# Patient Record
Sex: Male | Born: 1955
Health system: Southern US, Community
[De-identification: ages and names within clinical notes are randomized; demographics above are authoritative.]

## PROBLEM LIST (undated history)

## (undated) DIAGNOSIS — G453 Amaurosis fugax: Secondary | ICD-10-CM

## (undated) DIAGNOSIS — H9311 Tinnitus, right ear: Secondary | ICD-10-CM

## (undated) DIAGNOSIS — Z86010 Personal history of colonic polyps: Secondary | ICD-10-CM

## (undated) DIAGNOSIS — J40 Bronchitis, not specified as acute or chronic: Secondary | ICD-10-CM

## (undated) DIAGNOSIS — T4145XA Adverse effect of unspecified anesthetic, initial encounter: Secondary | ICD-10-CM

## (undated) DIAGNOSIS — F329 Major depressive disorder, single episode, unspecified: Secondary | ICD-10-CM

## (undated) DIAGNOSIS — F32A Depression, unspecified: Secondary | ICD-10-CM

## (undated) DIAGNOSIS — J45909 Unspecified asthma, uncomplicated: Secondary | ICD-10-CM

## (undated) DIAGNOSIS — B009 Herpesviral infection, unspecified: Secondary | ICD-10-CM

## (undated) DIAGNOSIS — R51 Headache: Secondary | ICD-10-CM

## (undated) DIAGNOSIS — C61 Malignant neoplasm of prostate: Secondary | ICD-10-CM

## (undated) DIAGNOSIS — I639 Cerebral infarction, unspecified: Secondary | ICD-10-CM

## (undated) DIAGNOSIS — J189 Pneumonia, unspecified organism: Secondary | ICD-10-CM

## (undated) DIAGNOSIS — S0990XA Unspecified injury of head, initial encounter: Secondary | ICD-10-CM

## (undated) DIAGNOSIS — I10 Essential (primary) hypertension: Secondary | ICD-10-CM

## (undated) DIAGNOSIS — M1990 Unspecified osteoarthritis, unspecified site: Secondary | ICD-10-CM

## (undated) DIAGNOSIS — S99929A Unspecified injury of unspecified foot, initial encounter: Secondary | ICD-10-CM

## (undated) DIAGNOSIS — K6289 Other specified diseases of anus and rectum: Secondary | ICD-10-CM

## (undated) DIAGNOSIS — M533 Sacrococcygeal disorders, not elsewhere classified: Secondary | ICD-10-CM

## (undated) DIAGNOSIS — T8859XA Other complications of anesthesia, initial encounter: Secondary | ICD-10-CM

## (undated) DIAGNOSIS — M4810 Ankylosing hyperostosis [Forestier], site unspecified: Secondary | ICD-10-CM

## (undated) DIAGNOSIS — G8929 Other chronic pain: Secondary | ICD-10-CM

## (undated) HISTORY — DX: Ankylosing hyperostosis (forestier), site unspecified: M48.10

## (undated) HISTORY — DX: Personal history of colonic polyps: Z86.010

## (undated) HISTORY — DX: Other chronic pain: G89.29

## (undated) HISTORY — DX: Depression, unspecified: F32.A

## (undated) HISTORY — DX: Unspecified osteoarthritis, unspecified site: M19.90

## (undated) HISTORY — PX: COLONOSCOPY: SHX174

## (undated) HISTORY — PX: TOOTH EXTRACTION: SUR596

## (undated) HISTORY — DX: Major depressive disorder, single episode, unspecified: F32.9

## (undated) HISTORY — DX: Essential (primary) hypertension: I10

## (undated) HISTORY — DX: Sacrococcygeal disorders, not elsewhere classified: M53.3

## (undated) HISTORY — PX: TONSILLECTOMY: SUR1361

---

## 1998-11-06 ENCOUNTER — Encounter: Payer: Self-pay | Admitting: Internal Medicine

## 2003-09-04 ENCOUNTER — Encounter: Payer: Self-pay | Admitting: Internal Medicine

## 2004-02-19 ENCOUNTER — Ambulatory Visit: Payer: Self-pay | Admitting: Internal Medicine

## 2004-08-18 ENCOUNTER — Ambulatory Visit: Payer: Self-pay | Admitting: Internal Medicine

## 2004-08-25 ENCOUNTER — Ambulatory Visit: Payer: Self-pay | Admitting: Internal Medicine

## 2005-03-15 ENCOUNTER — Ambulatory Visit: Payer: Self-pay | Admitting: Internal Medicine

## 2005-04-19 DIAGNOSIS — Z860100 Personal history of colon polyps, unspecified: Secondary | ICD-10-CM

## 2005-04-19 HISTORY — DX: Personal history of colonic polyps: Z86.010

## 2005-04-19 HISTORY — DX: Personal history of colon polyps, unspecified: Z86.0100

## 2006-01-10 ENCOUNTER — Ambulatory Visit: Payer: Self-pay | Admitting: Internal Medicine

## 2006-01-19 ENCOUNTER — Ambulatory Visit: Payer: Self-pay | Admitting: Gastroenterology

## 2006-01-25 ENCOUNTER — Ambulatory Visit: Payer: Self-pay | Admitting: Gastroenterology

## 2006-01-25 ENCOUNTER — Encounter (INDEPENDENT_AMBULATORY_CARE_PROVIDER_SITE_OTHER): Payer: Self-pay | Admitting: *Deleted

## 2006-01-25 ENCOUNTER — Encounter: Payer: Self-pay | Admitting: Internal Medicine

## 2006-08-23 ENCOUNTER — Ambulatory Visit: Payer: Self-pay | Admitting: Internal Medicine

## 2006-08-24 LAB — CONVERTED CEMR LAB
ALT: 41 units/L — ABNORMAL HIGH (ref 0–40)
Basophils Absolute: 0 10*3/uL (ref 0.0–0.1)
Bilirubin, Direct: 0.1 mg/dL (ref 0.0–0.3)
Calcium: 10.2 mg/dL (ref 8.4–10.5)
Direct LDL: 153.3 mg/dL
Eosinophils Absolute: 0.1 10*3/uL (ref 0.0–0.6)
Eosinophils Relative: 1.3 % (ref 0.0–5.0)
GFR calc Af Amer: 101 mL/min
GFR calc non Af Amer: 83 mL/min
Glucose, Bld: 95 mg/dL (ref 70–99)
Lymphocytes Relative: 29.9 % (ref 12.0–46.0)
MCHC: 34.3 g/dL (ref 30.0–36.0)
MCV: 94.1 fL (ref 78.0–100.0)
Monocytes Relative: 5.4 % (ref 3.0–11.0)
Neutro Abs: 4.5 10*3/uL (ref 1.4–7.7)
Platelets: 242 10*3/uL (ref 150–400)
WBC: 7.1 10*3/uL (ref 4.5–10.5)

## 2006-09-07 ENCOUNTER — Ambulatory Visit: Payer: Self-pay | Admitting: Internal Medicine

## 2006-09-08 ENCOUNTER — Encounter: Payer: Self-pay | Admitting: Internal Medicine

## 2007-01-16 DIAGNOSIS — I10 Essential (primary) hypertension: Secondary | ICD-10-CM | POA: Insufficient documentation

## 2007-02-22 ENCOUNTER — Ambulatory Visit: Payer: Self-pay | Admitting: Internal Medicine

## 2007-06-16 ENCOUNTER — Ambulatory Visit: Payer: Self-pay | Admitting: Internal Medicine

## 2007-06-16 DIAGNOSIS — M4810 Ankylosing hyperostosis [Forestier], site unspecified: Secondary | ICD-10-CM | POA: Insufficient documentation

## 2007-06-16 DIAGNOSIS — Z86010 Personal history of colonic polyps: Secondary | ICD-10-CM | POA: Insufficient documentation

## 2007-06-20 ENCOUNTER — Telehealth: Payer: Self-pay | Admitting: Internal Medicine

## 2007-06-30 ENCOUNTER — Encounter: Payer: Self-pay | Admitting: Internal Medicine

## 2007-07-03 ENCOUNTER — Encounter: Payer: Self-pay | Admitting: Internal Medicine

## 2007-08-18 ENCOUNTER — Ambulatory Visit: Payer: Self-pay | Admitting: Internal Medicine

## 2007-08-28 ENCOUNTER — Ambulatory Visit: Payer: Self-pay | Admitting: Internal Medicine

## 2007-08-28 DIAGNOSIS — J069 Acute upper respiratory infection, unspecified: Secondary | ICD-10-CM | POA: Insufficient documentation

## 2008-02-01 ENCOUNTER — Ambulatory Visit: Payer: Self-pay | Admitting: Internal Medicine

## 2008-02-01 DIAGNOSIS — F329 Major depressive disorder, single episode, unspecified: Secondary | ICD-10-CM | POA: Insufficient documentation

## 2008-02-01 DIAGNOSIS — F3289 Other specified depressive episodes: Secondary | ICD-10-CM | POA: Insufficient documentation

## 2008-02-05 ENCOUNTER — Telehealth: Payer: Self-pay | Admitting: Internal Medicine

## 2008-02-05 LAB — CONVERTED CEMR LAB
Alkaline Phosphatase: 18 units/L — ABNORMAL LOW (ref 39–117)
BUN: 16 mg/dL (ref 6–23)
Basophils Relative: 1.7 % (ref 0.0–3.0)
Bilirubin, Direct: 0.1 mg/dL (ref 0.0–0.3)
CO2: 29 meq/L (ref 19–32)
Chloride: 104 meq/L (ref 96–112)
Creatinine, Ser: 0.9 mg/dL (ref 0.4–1.5)
Eosinophils Relative: 2.8 % (ref 0.0–5.0)
Glucose, Bld: 98 mg/dL (ref 70–99)
Lymphocytes Relative: 29.5 % (ref 12.0–46.0)
Neutrophils Relative %: 60.1 % (ref 43.0–77.0)
RBC: 4.74 M/uL (ref 4.22–5.81)
TSH: 1.16 microintl units/mL (ref 0.35–5.50)
Testosterone: 243.37 ng/dL — ABNORMAL LOW (ref 350.00–890)
Total Bilirubin: 0.9 mg/dL (ref 0.3–1.2)
Total Protein: 7 g/dL (ref 6.0–8.3)
WBC: 6.4 10*3/uL (ref 4.5–10.5)

## 2008-02-09 ENCOUNTER — Telehealth: Payer: Self-pay | Admitting: Internal Medicine

## 2008-02-20 ENCOUNTER — Encounter: Admission: RE | Admit: 2008-02-20 | Discharge: 2008-04-16 | Payer: Self-pay | Admitting: Internal Medicine

## 2008-03-25 ENCOUNTER — Ambulatory Visit: Payer: Self-pay | Admitting: Internal Medicine

## 2008-04-18 ENCOUNTER — Encounter: Payer: Self-pay | Admitting: Internal Medicine

## 2008-06-13 ENCOUNTER — Ambulatory Visit: Payer: Self-pay | Admitting: Internal Medicine

## 2008-12-05 ENCOUNTER — Ambulatory Visit: Payer: Self-pay | Admitting: Internal Medicine

## 2009-06-18 ENCOUNTER — Ambulatory Visit: Payer: Self-pay | Admitting: Internal Medicine

## 2009-06-18 DIAGNOSIS — E349 Endocrine disorder, unspecified: Secondary | ICD-10-CM | POA: Insufficient documentation

## 2009-06-19 ENCOUNTER — Telehealth: Payer: Self-pay | Admitting: Internal Medicine

## 2009-06-19 LAB — CONVERTED CEMR LAB
BUN: 10 mg/dL (ref 6–23)
Basophils Absolute: 0 10*3/uL (ref 0.0–0.1)
Bilirubin, Direct: 0.2 mg/dL (ref 0.0–0.3)
Cholesterol: 210 mg/dL — ABNORMAL HIGH (ref 0–200)
Creatinine, Ser: 1.1 mg/dL (ref 0.4–1.5)
Direct LDL: 134.2 mg/dL
GFR calc non Af Amer: 73.59 mL/min (ref >60)
Glucose, Bld: 93 mg/dL (ref 70–99)
HCT: 44.4 % (ref 39.0–52.0)
HDL: 57.3 mg/dL (ref >39.00)
Lymphs Abs: 1.7 10*3/uL (ref 0.7–4.0)
MCV: 95.2 fL (ref 78.0–100.0)
Monocytes Absolute: 0.6 10*3/uL (ref 0.1–1.0)
Monocytes Relative: 9.1 % (ref 3.0–12.0)
Neutrophils Relative %: 61.1 % (ref 43.0–77.0)
PSA: 3.09 ng/mL (ref 0.10–4.00)
Platelets: 231 10*3/uL (ref 150.0–400.0)
Potassium: 3.9 meq/L (ref 3.5–5.1)
RDW: 11.7 % (ref 11.5–14.6)
TSH: 1.81 microintl units/mL (ref 0.35–5.50)
Testosterone: 239.38 ng/dL — ABNORMAL LOW (ref 350.00–890.00)
Total Bilirubin: 0.9 mg/dL (ref 0.3–1.2)
VLDL: 46.8 mg/dL — ABNORMAL HIGH (ref 0.0–40.0)
WBC: 6.1 10*3/uL (ref 4.5–10.5)

## 2009-07-02 ENCOUNTER — Encounter: Payer: Self-pay | Admitting: Internal Medicine

## 2009-12-31 ENCOUNTER — Ambulatory Visit: Payer: Self-pay | Admitting: Internal Medicine

## 2010-05-19 NOTE — Assessment & Plan Note (Signed)
Summary: emp-will fast//ccm   Vital Signs:  Patient profile:   55 year old male Height:      68 inches Weight:      217 pounds BMI:     33.11 Temp:     98.8 degrees F oral BP sitting:   130 / 90  (left arm) Cuff size:   large  Vitals Entered By: Duard Brady LPN (June 19, 2954 8:46 AM) CC: cpx - noted increase in BP - pt is self adjusting medication in effort to improve - unsucessful Is Patient Diabetic? No   CC:  cpx - noted increase in BP - pt is self adjusting medication in effort to improve - unsucessful.  History of Present Illness: 55 year old patient who has a history of DISH who is in today for a extensive assessment.  he continues to have generalized pain, stiffness, but remains a fairly active with this is [facility].  He continues to have considerable pain with the transferring.  He is concerned about worsening kyphosis and has been use in a backpack to assist with straightening.  He is treated hypertension, which has trended up a bit.  He has self titrated his metoprolol to 250 mg daily in divided dosages  Preventive Screening-Counseling & Management  Alcohol-Tobacco     Smoking Status: quit  Caffeine-Diet-Exercise     Does Patient Exercise: yes  Allergies: 1)  ! * Amitriptyline  Past History:  Past Medical History: Hypertension DISH Colonic polyps, hx of Depression testosterone deficiency  Past Surgical History: Reviewed history from 06/16/2007 and no changes required. Colonoscopy October 07  Family History: Reviewed history from 06/16/2007 and no changes required. Family History Hypertension Family History of Hodgkin's Disease father died age 104, motor vehicle accident  mother history of hypothyroidism 3 brothers, history hypertension 4 sisters, history of Hodkins disease,hypothyroidism , hypertension  Social History: Married Regular exercise-yes Smoking Status:  quit Does Patient Exercise:  yes  Review of  Systems       The patient complains of difficulty walking and depression.  The patient denies anorexia, fever, weight loss, weight gain, vision loss, decreased hearing, hoarseness, chest pain, syncope, dyspnea on exertion, peripheral edema, prolonged cough, headaches, hemoptysis, abdominal pain, melena, hematochezia, severe indigestion/heartburn, hematuria, incontinence, genital sores, muscle weakness, suspicious skin lesions, transient blindness, unusual weight change, abnormal bleeding, enlarged lymph nodes, angioedema, breast masses, and testicular masses.         chronic tinnitus  Physical Exam  General:  overweight-appearing.   140/82overweight-appearing.   Head:  Normocephalic and atraumatic without obvious abnormalities. No apparent alopecia or balding. Eyes:  No corneal or conjunctival inflammation noted. EOMI. Perrla. Funduscopic exam benign, without hemorrhages, exudates or papilledema. Vision grossly normal. Ears:  External ear exam shows no significant lesions or deformities.  Otoscopic examination reveals clear canals, tympanic membranes are intact bilaterally without bulging, retraction, inflammation or discharge. Hearing is grossly normal bilaterally. Nose:  External nasal examination shows no deformity or inflammation. Nasal mucosa are pink and moist without lesions or exudates. Mouth:  Oral mucosa and oropharynx without lesions or exudates.  Teeth in good repair. Neck:  No deformities, masses, or tenderness noted. Chest Wall:  No deformities, masses, tenderness or gynecomastia noted. Breasts:  No masses or gynecomastia noted Lungs:  Normal respiratory effort, chest expands symmetrically. Lungs are clear to auscultation, no crackles or wheezes. Heart:  Normal rate and regular rhythm. S1 and S2 normal without gallop, murmur, click, rub or other extra sounds.  Abdomen:  Bowel sounds positive,abdomen soft and non-tender without masses, organomegaly or hernias noted. Rectal:  No  external abnormalities noted. Normal sphincter tone. No rectal masses or tenderness. Genitalia:  Testes bilaterally descended without nodularity, tenderness or masses. No scrotal masses or lesions. No penis lesions or urethral discharge. Prostate:  Prostate gland firm and smooth, no enlargement, nodularity, tenderness, mass, asymmetry or induration. Msk:  No deformity or scoliosis noted of thoracic or lumbar spine.   Pulses:  decreased left dorsalis pedis pulse Extremities:  No clubbing, cyanosis, edema, or deformity noted with normal full range of motion of all joints.   Neurologic:  No cranial nerve deficits noted. Station and gait are normal. Plantar reflexes are down-going bilaterally. DTRs are symmetrical throughout. Sensory, motor and coordinative functions appear intact. Skin:  Intact without suspicious lesions or rashes Cervical Nodes:  No lymphadenopathy noted Axillary Nodes:  No palpable lymphadenopathy Inguinal Nodes:  No significant adenopathy Psych:  Cognition and judgment appear intact. Alert and cooperative with normal attention span and concentration. No apparent delusions, illusions, hallucinations   Impression & Recommendations:  Problem # 1:  DEPRESSION (ICD-311)  Problem # 2:  DIFFUSE IDIOPATHIC SKELETAL HYPEROSTOSIS (ICD-733.99)  Orders: Venipuncture (16109) TLB-Lipid Panel (80061-LIPID) TLB-BMP (Basic Metabolic Panel-BMET) (80048-METABOL) TLB-CBC Platelet - w/Differential (85025-CBCD) TLB-Hepatic/Liver Function Pnl (80076-HEPATIC) TLB-TSH (Thyroid Stimulating Hormone) (84443-TSH) Rheumatology Referral (Rheumatology)  Problem # 3:  HYPERTENSION (ICD-401.9)  His updated medication list for this problem includes:    Metoprolol Tartrate 100 Mg Tabs (Metoprolol tartrate) ..... One and one half   twice daily    Furosemide 40 Mg Tabs (Furosemide) ..... One daily, as needed for swelling  His updated medication list for this problem includes:    Metoprolol Tartrate  100 Mg Tabs (Metoprolol tartrate) ..... One  twice daily    Furosemide 40 Mg Tabs (Furosemide) ..... One daily, as needed for swelling  Orders: Venipuncture (60454) TLB-Lipid Panel (80061-LIPID) TLB-BMP (Basic Metabolic Panel-BMET) (80048-METABOL) TLB-CBC Platelet - w/Differential (85025-CBCD) TLB-Hepatic/Liver Function Pnl (80076-HEPATIC) TLB-TSH (Thyroid Stimulating Hormone) (84443-TSH)  Problem # 4:  COLONIC POLYPS, HX OF (ICD-V12.72)  Orders: Venipuncture (09811) TLB-Lipid Panel (80061-LIPID) TLB-BMP (Basic Metabolic Panel-BMET) (80048-METABOL) TLB-CBC Platelet - w/Differential (85025-CBCD) TLB-Hepatic/Liver Function Pnl (80076-HEPATIC) TLB-TSH (Thyroid Stimulating Hormone) (84443-TSH)  Complete Medication List: 1)  Clobetasol Propionate 0.05 % Crea (Clobetasol propionate) .... Apply twice daily as needed 2)  Flexeril 10 Mg Tabs (Cyclobenzaprine hcl) .[name].. 1 q6h as needed 3)  Adult Aspirin Low Strength 81 Mg Tbdp (Aspirin) .[name].. 1 once daily 4)  Ibuprofen 800 Mg Tabs (Ibuprofen) .[name].. 1 three times a day as needed 5)  Darvocet-n 100 100-650 Mg Tabs (Propoxyphene n-apap) .[name].. 1 q6h as needed 6)  Amlodipine Besylate 10 Mg Tabs (amlodipine Besylate)  .... One daily 7)  Metoprolol Tartrate 100 Mg Tabs (Metoprolol tartrate) .... One and one half   twice daily 8)  Hydrocodone-homatropine 5-1.5 Mg/41ml Syrp (Hydrocodone-homatropine) .... One tsp every 6 hours for cough 9)  Voltaren 1 % Gel (Diclofenac sodium) .... Used twice daily as needed 10)  Androgel Pump 1 % Gel (Testosterone) .... 4 pumps qam 11)  Furosemide 40 Mg Tabs (Furosemide) .... One daily, as needed for swelling 12)  Tramadol Hcl 50 Mg Tabs (Tramadol hcl) .... Prn 13)  Zolpidem Tartrate 10 Mg Tabs (Zolpidem tartrate) .... One at bedtime as needed for sleep  Other Orders: TLB-PSA (Prostate Specific Antigen) (84153-PSA) TLB-Testosterone, Total (84403-TESTO)  Patient Instructions: 1)  Please schedule a follow-up  appointment in 6 months. 2)  Limit your Sodium (Salt). 3)  It is important that you exercise regularly at least 20 minutes 5 times a week. If you develop chest pain, have severe difficulty breathing, or feel very tired , stop exercising immediately and seek medical attention. 4)  You need to lose weight. Consider a lower calorie diet and regular exercise.  5)  Check your Blood Pressure regularly. If it is above: 150/90 you should make an appointment. Prescriptions: ZOLPIDEM TARTRATE 10 MG TABS (ZOLPIDEM TARTRATE) one at bedtime as needed for sleep  #50 x 4   Entered and Authorized by:   Gordy Savers  MD   Signed by:   Gordy Savers  MD on 06/18/2009   Method used:   Print then Give to Patient   RxID:   [id number] TRAMADOL HCL 50 MG TABS (TRAMADOL HCL) prn  #120 x 6   Entered and Authorized by:   Gordy Savers  MD   Signed by:   Gordy Savers  MD on 06/18/2009   Method used:   Print then Give to Patient   RxID:   [id number] FUROSEMIDE 40 MG TABS (FUROSEMIDE) one daily, as needed for swelling  #90 x 6   Entered and Authorized by:   Gordy Savers  MD   Signed by:   Gordy Savers  MD on 06/18/2009   Method used:   Print then Give to Patient   RxID:   [id number] ANDROGEL PUMP 1 % GEL (TESTOSTERONE) 4 pumps qam  #6 x 6   Entered and Authorized by:   Gordy Savers  MD   Signed by:   Gordy Savers  MD on 06/18/2009   Method used:   Print then Give to Patient   RxID:   [id number] VOLTAREN 1 % GEL (DICLOFENAC SODIUM) used twice daily as needed  #12 x 6   Entered and Authorized by:   Gordy Savers  MD   Signed by:   Gordy Savers  MD on 06/18/2009   Method used:   Print then Give to Patient   RxID:   [id number] METOPROLOL TARTRATE 100 MG  TABS (METOPROLOL TARTRATE) one and one half   twice daily  #270 x 6   Entered and Authorized by:   Gordy Savers  MD   Signed by:   Gordy Savers  MD on  06/18/2009   Method used:   Print then Give to Patient   RxID:   [id number] AMLODIPINE BESYLATE 10 MG  TABS (AMLODIPINE BESYLATE) one daily  #90 x 6   Entered and Authorized by:   Gordy Savers  MD   Signed by:   Gordy Savers  MD on 06/18/2009   Method used:   Print then Give to Patient   RxID:   [id number] IBUPROFEN 800 MG  TABS (IBUPROFEN) 1 three times a day as needed  #270 x 6   Entered and Authorized by:   Gordy Savers  MD   Signed by:   Gordy Savers  MD on 06/18/2009   Method used:   Print then Give to Patient   RxID:   [id number] FLEXERIL 10 MG TABS (CYCLOBENZAPRINE HCL) 1 q6h as needed  #180 x 6   Entered and Authorized by:   Gordy Savers  MD   Signed by:   Gordy Savers  MD on 06/18/2009   Method used:   Print then Give to Patient   RxID:  [id number]  

## 2010-05-19 NOTE — Progress Notes (Signed)
Summary: called again- change in medication  Phone Note Call from Patient   Caller: Patient Call For: Selena Batten Summary of Call: returning your call Initial call taken by: Raechel Ache, RN,  June 19, 2009 10:29 AM  Follow-up for Phone Call        spoke with patient - informed of labs and change to medication.  Patient questioning how to apply double the amt medication? limited mobility and was wondering is there any better way to accomplish? Follow-up by: Duard Brady LPN,  June 19, 2009 1:48 PM  Additional Follow-up for Phone Call Additional follow up Details #1::        on Androgel- can he use 4 squirts two times a day? if so, needs new script with increased dosage. Additional Follow-up by: Raechel Ache, RN,  June 19, 2009 2:58 PM    Additional Follow-up for Phone Call Additional follow up Details #2::    OK to change to 6 puffs but must take all in the am; OK to change Rx to comply with new order Follow-up by: Gordy Savers  MD,  June 19, 2009 4:55 PM  New/Updated Medications: ANDROGEL PUMP 1 % GEL (TESTOSTERONE) 6 pumps qam Prescriptions: ANDROGEL PUMP 1 % GEL (TESTOSTERONE) 6 pumps qam  #3 pumps x 6   Entered and Authorized by:   Gordy Savers  MD   Signed by:   Gordy Savers  MD on 06/19/2009   Method used:   Print then Give to Patient   RxID:   [id number]   Appended Document: called again- change in medication spoke with pt - discussed new directions on increase - rx ready for pick up . KIK

## 2010-05-19 NOTE — Assessment & Plan Note (Signed)
Summary: 6 month fup//ccm   Vital Signs:  Patient profile:   55 year old male Weight:      214 pounds Temp:     98.6 degrees F oral BP sitting:   118 / 70  (left arm) Cuff size:   regular  Vitals Entered By: Duard Brady LPN (December 31, 2009 11:38 AM) CC: 6 mos rov - doing well Is Patient Diabetic? No Flu Vaccine Consent Questions     Do you have a history of severe allergic reactions to this vaccine? no    Any prior history of allergic reactions to egg and/or gelatin? no    Do you have a sensitivity to the preservative Thimersol? no    Do you have a past history of Guillan-Barre Syndrome? no    Do you currently have an acute febrile illness? no    Have you ever had a severe reaction to latex? no    Vaccine information given and explained to patient? yes    Are you currently pregnant? no    Lot Number:[id number]   Exp Date:10/17/2010   Site Given  Left Deltoid IM   CC:  6 mos rov - doing well.  History of Present Illness: 55 year old patient who is in today for follow-up of advanced DISH.  He is doing reasonably well, but still, quite symptomatic.  Earlier he had some significant left shoulder pain and diminished range of motion, which has improved with the aggressive physical therapy.  He has been diagnosed with early macular degeneration, but pressures have been running in the high normal range.  He has testosterone deficiency on supplements.  He also has a history of colonic polyps.  He has a history also of depression, which has been stable  Allergies: 1)  ! * Amitriptyline  Past History:  Past Medical History: Hypertension DISH Colonic polyps, hx of Depression testosterone deficiency macular degeneration  Past Surgical History: Reviewed history from 06/16/2007 and no changes required. Colonoscopy October 07  Review of Systems       The patient complains of muscle weakness, difficulty walking, and depression.  The patient denies anorexia, fever, weight  loss, weight gain, vision loss, decreased hearing, hoarseness, chest pain, syncope, dyspnea on exertion, peripheral edema, prolonged cough, headaches, hemoptysis, abdominal pain, melena, hematochezia, severe indigestion/heartburn, hematuria, incontinence, genital sores, suspicious skin lesions, transient blindness, unusual weight change, abnormal bleeding, enlarged lymph nodes, angioedema, breast masses, and testicular masses.    Physical Exam  General:  overweight-appearing.  130/82 Head:  Normocephalic and atraumatic without obvious abnormalities. No apparent alopecia or balding. Eyes:  No corneal or conjunctival inflammation noted. EOMI. Perrla. Funduscopic exam benign, without hemorrhages, exudates or papilledema. Vision grossly normal. Mouth:  Oral mucosa and oropharynx without lesions or exudates.  Teeth in good repair. Neck:  No deformities, masses, or tenderness noted. Lungs:  Normal respiratory effort, chest expands symmetrically. Lungs are clear to auscultation, no crackles or wheezes. Heart:  Normal rate and regular rhythm. S1 and S2 normal without gallop, murmur, click, rub or other extra sounds. Abdomen:  Bowel sounds positive,abdomen soft and non-tender without masses, organomegaly or hernias noted. Msk:  No deformity or scoliosis noted of thoracic or lumbar spine.   Pulses:  R and L carotid,radial,femoral,dorsalis pedis and posterior tibial pulses are full and equal bilaterally Extremities:  No clubbing, cyanosis, edema, or deformity noted with normal full range of motion of all joints.     Impression & Recommendations:  Problem # 1:  HYPOGONADISM (ICD-257.2)  Problem #  2:  DIFFUSE IDIOPATHIC SKELETAL HYPEROSTOSIS (ICD-733.99)  Problem # 3:  HYPERTENSION (ICD-401.9)  His updated medication list for this problem includes:    Metoprolol Tartrate 100 Mg Tabs (Metoprolol tartrate) ..... One and one half   twice daily    Furosemide 40 Mg Tabs (Furosemide) ..... One daily, as  needed for swelling  Complete Medication List: 1)  Clobetasol Propionate 0.05 % Crea (Clobetasol propionate) .... Apply twice daily as needed 2)  Flexeril 10 Mg Tabs (Cyclobenzaprine hcl) .[name].. 1 q6h as needed 3)  Adult Aspirin Low Strength 81 Mg Tbdp (Aspirin) .[name].. 1 once daily 4)  Ibuprofen 800 Mg Tabs (Ibuprofen) .[name].. 1 three times a day as needed 5)  Darvocet-n 100 100-650 Mg Tabs (Propoxyphene n-apap) .[name].. 1 q6h as needed 6)  Amlodipine Besylate 10 Mg Tabs (amlodipine Besylate)  .... One daily 7)  Metoprolol Tartrate 100 Mg Tabs (Metoprolol tartrate) .... One and one half   twice daily 8)  Hydrocodone-homatropine 5-1.5 Mg/51ml Syrp (Hydrocodone-homatropine) .... One tsp every 6 hours for cough 9)  Voltaren 1 % Gel (Diclofenac sodium) .... Used twice daily as needed 10)  Androgel Pump 1 % Gel (Testosterone) .... 8  pumps qam 11)  Furosemide 40 Mg Tabs (Furosemide) .... One daily, as needed for swelling 12)  Tramadol Hcl 50 Mg Tabs (Tramadol hcl) .... Prn 13)  Zolpidem Tartrate 10 Mg Tabs (Zolpidem tartrate) .... One at bedtime as needed for sleep  Other Orders: Flu Vaccine 63yrs + MEDICARE PATIENTS (V7616) Administration Flu vaccine - MCR (W7371)  Patient Instructions: 1)  Please schedule a follow-up appointment in 6 months for CPX  2)  Advised not to eat any food or drink any liquids after 10 PM the night before your procedure. 3)  Limit your Sodium (Salt). Prescriptions: ZOLPIDEM TARTRATE 10 MG TABS (ZOLPIDEM TARTRATE) one at bedtime as needed for sleep  #50 x 4   Entered and Authorized by:   Gordy Savers  MD   Signed by:   Gordy Savers  MD on 12/31/2009   Method used:   Print then Give to Patient   RxID:   [id number] TRAMADOL HCL 50 MG TABS (TRAMADOL HCL) prn  #120 x 6   Entered and Authorized by:   Gordy Savers  MD   Signed by:   Gordy Savers  MD on 12/31/2009   Method used:   Print then Give to Patient   RxID:   [id number] FUROSEMIDE  40 MG TABS (FUROSEMIDE) one daily, as needed for swelling  #90 x 6   Entered and Authorized by:   Gordy Savers  MD   Signed by:   Gordy Savers  MD on 12/31/2009   Method used:   Print then Give to Patient   RxID:   [id number] ANDROGEL PUMP 1 % GEL (TESTOSTERONE) 8  pumps qam  #4 x 6   Entered and Authorized by:   Gordy Savers  MD   Signed by:   Gordy Savers  MD on 12/31/2009   Method used:   Print then Give to Patient   RxID:   [id number] VOLTAREN 1 % GEL (DICLOFENAC SODIUM) used twice daily as needed  #12 x 6   Entered and Authorized by:   Gordy Savers  MD   Signed by:   Gordy Savers  MD on 12/31/2009   Method used:   Print then Give to Patient   RxID:   [id number] METOPROLOL TARTRATE 100  MG  TABS (METOPROLOL TARTRATE) one and one half   twice daily  #270 x 6   Entered and Authorized by:   Gordy Savers  MD   Signed by:   Gordy Savers  MD on 12/31/2009   Method used:   Print then Give to Patient   RxID:   [id number] AMLODIPINE BESYLATE 10 MG  TABS (AMLODIPINE BESYLATE) one daily  #90 x 6   Entered and Authorized by:   Gordy Savers  MD   Signed by:   Gordy Savers  MD on 12/31/2009   Method used:   Print then Give to Patient   RxID:   [id number] IBUPROFEN 800 MG  TABS (IBUPROFEN) 1 three times a day as needed  #270 x 6   Entered and Authorized by:   Gordy Savers  MD   Signed by:   Gordy Savers  MD on 12/31/2009   Method used:   Print then Give to Patient   RxID:   [id number] FLEXERIL 10 MG TABS (CYCLOBENZAPRINE HCL) 1 q6h as needed  #180 x 6   Entered and Authorized by:   Gordy Savers  MD   Signed by:   Gordy Savers  MD on 12/31/2009   Method used:   Print then Give to Patient   RxID:   [id number]

## 2010-05-19 NOTE — Consult Note (Signed)
Summary: [facility]   Imported By: Maryln Gottron 07/10/2009 14:44:47  _____________________________________________________________________  External Attachment:    Type:   Image     Comment:   External Document

## 2010-06-22 ENCOUNTER — Other Ambulatory Visit: Payer: Self-pay | Admitting: Internal Medicine

## 2010-06-24 ENCOUNTER — Other Ambulatory Visit: Payer: Self-pay | Admitting: Internal Medicine

## 2010-06-25 ENCOUNTER — Telehealth: Payer: Self-pay

## 2010-06-25 ENCOUNTER — Other Ambulatory Visit: Payer: Self-pay | Admitting: Internal Medicine

## 2010-06-25 NOTE — Telephone Encounter (Signed)
error 

## 2010-07-31 ENCOUNTER — Encounter: Payer: Self-pay | Admitting: Internal Medicine

## 2010-08-03 ENCOUNTER — Encounter: Payer: Self-pay | Admitting: Internal Medicine

## 2010-08-03 ENCOUNTER — Ambulatory Visit (INDEPENDENT_AMBULATORY_CARE_PROVIDER_SITE_OTHER): Payer: Medicare Other | Admitting: Internal Medicine

## 2010-08-03 VITALS — BP 118/80 | HR 70 | Temp 98.0°F | Resp 18 | Ht 68.5 in | Wt 218.0 lb

## 2010-08-03 DIAGNOSIS — I10 Essential (primary) hypertension: Secondary | ICD-10-CM

## 2010-08-03 DIAGNOSIS — N401 Enlarged prostate with lower urinary tract symptoms: Secondary | ICD-10-CM

## 2010-08-03 DIAGNOSIS — F3289 Other specified depressive episodes: Secondary | ICD-10-CM

## 2010-08-03 DIAGNOSIS — Z13220 Encounter for screening for lipoid disorders: Secondary | ICD-10-CM

## 2010-08-03 DIAGNOSIS — R338 Other retention of urine: Secondary | ICD-10-CM

## 2010-08-03 DIAGNOSIS — R339 Retention of urine, unspecified: Secondary | ICD-10-CM

## 2010-08-03 DIAGNOSIS — Z0000 Encounter for general adult medical examination without abnormal findings: Secondary | ICD-10-CM

## 2010-08-03 DIAGNOSIS — M948X9 Other specified disorders of cartilage, unspecified sites: Secondary | ICD-10-CM

## 2010-08-03 DIAGNOSIS — Z86010 Personal history of colonic polyps: Secondary | ICD-10-CM

## 2010-08-03 DIAGNOSIS — F329 Major depressive disorder, single episode, unspecified: Secondary | ICD-10-CM

## 2010-08-03 DIAGNOSIS — E291 Testicular hypofunction: Secondary | ICD-10-CM

## 2010-08-03 LAB — HEPATIC FUNCTION PANEL
ALT: 38 U/L (ref 0–53)
Albumin: 4.2 g/dL (ref 3.5–5.2)
Alkaline Phosphatase: 17 U/L — ABNORMAL LOW (ref 39–117)
Total Protein: 6.7 g/dL (ref 6.0–8.3)

## 2010-08-03 LAB — BASIC METABOLIC PANEL
CO2: 31 mEq/L (ref 19–32)
Chloride: 101 mEq/L (ref 96–112)
Sodium: 140 mEq/L (ref 135–145)

## 2010-08-03 LAB — CBC WITH DIFFERENTIAL/PLATELET
Basophils Relative: 0.5 % (ref 0.0–3.0)
Eosinophils Absolute: 0.1 10*3/uL (ref 0.0–0.7)
Hemoglobin: 14.8 g/dL (ref 13.0–17.0)
Lymphocytes Relative: 34.1 % (ref 12.0–46.0)
MCHC: 34.8 g/dL (ref 30.0–36.0)
MCV: 94.6 fl (ref 78.0–100.0)
Monocytes Absolute: 0.6 10*3/uL (ref 0.1–1.0)
Neutro Abs: 3.3 10*3/uL (ref 1.4–7.7)
RBC: 4.49 Mil/uL (ref 4.22–5.81)

## 2010-08-03 LAB — TESTOSTERONE: Testosterone: 171.72 ng/dL — ABNORMAL LOW (ref 350.00–890.00)

## 2010-08-03 LAB — LDL CHOLESTEROL, DIRECT: Direct LDL: 133.5 mg/dL

## 2010-08-03 LAB — LIPID PANEL: HDL: 46.1 mg/dL (ref >39.00)

## 2010-08-03 MED ORDER — METOPROLOL TARTRATE 100 MG PO TABS: 100.0000 mg | ORAL_TABLET | Freq: Two times a day (BID) | ORAL | Status: DC

## 2010-08-03 MED ORDER — ZOLPIDEM TARTRATE 10 MG PO TABS: 10.0000 mg | ORAL_TABLET | Freq: Every evening | ORAL | Status: DC | PRN

## 2010-08-03 MED ORDER — TESTOSTERONE 12.5 MG/ACT (1%) TD GEL: 1.0000 "application " | TRANSDERMAL | Status: DC

## 2010-08-03 MED ORDER — CYCLOBENZAPRINE HCL 10 MG PO TABS: 10.0000 mg | ORAL_TABLET | Freq: Three times a day (TID) | ORAL | Status: DC | PRN

## 2010-08-03 MED ORDER — DICLOFENAC SODIUM 1 % TD GEL: 1.0000 "application " | Freq: Two times a day (BID) | TRANSDERMAL | Status: DC | PRN

## 2010-08-03 MED ORDER — FUROSEMIDE 40 MG PO TABS: 40.0000 mg | ORAL_TABLET | Freq: Every day | ORAL | Status: DC | PRN

## 2010-08-03 MED ORDER — TRAMADOL HCL 50 MG PO TABS: 50.0000 mg | ORAL_TABLET | ORAL | Status: DC | PRN

## 2010-08-03 MED ORDER — AMLODIPINE BESYLATE 10 MG PO TABS: 10.0000 mg | ORAL_TABLET | Freq: Every day | ORAL | Status: DC

## 2010-08-03 NOTE — Patient Instructions (Signed)

## 2010-08-03 NOTE — Progress Notes (Signed)
Subjective:    Patient ID: Luis Fisher, male    DOB: 09-Mar-1956, 55 y.o.   MRN: [id number]  HPI  55 year old patient who is seen today for an annual examination. He has been disabled due to DISH. He continues to have pain and worsening stiffness and functional disability. Disability forms were filled out again today. He has treated hypertension testosterone deficiency and history of depression. He also has a history colonic polyps and has had recent followup colonoscopy.  Past Medical History  Diagnosis Date  . Hypertension   . Depression   . DISH (diffuse idiopathic skeletal hyperostosis)   . Hx of colonic polyps    No past surgical history on file.  reports that he quit smoking about 17 years ago. His smoking use included Cigars. He has never used smokeless tobacco. He reports that he drinks alcohol. He reports that he does not use illicit drugs. family history includes Hodgkin's lymphoma in his sister; Hypertension in his brother and sister; and Hypothyroidism in his mother and sister. No Known Allergies  1. Risk factors, based on past  M,S,F history  cardiovascular risk factors include hypertension  2.  Physical activities: Fairly sedentary due to orthopedic limitations with pain stiffness and weakness. He does go to the gym 3 times weekly for stretching and modest exercise  3.  Depression/mood: History of depression presently stable  4.  Hearing: No hearing deficits  5.  ADL's: Requires assistance with most aspects of daily living due to pain and stiffness  6.  Fall risk: Moderately high  7.  Home safety: No problems identified  8.  Height weight, and visual acuity; height and weight stable the changes is visual acuity  9.  Counseling: Heart healthy diet regular exercise with stretching and weight loss all encouraged  10. Lab orders based on risk factors: Laboratory profile including PSA and lipid profile will be reviewed  11. Referral : Not appropriate at this time  12.  Care plan: Continue heart healthy diet exercises with stretching modified salt diet encouraged  13. Cognitive assessment: Alert and oriented with normal affect no cognitive dysfunction      Review of Systems  Constitutional: Negative for fever, chills, activity change, appetite change and fatigue.  HENT: Negative for hearing loss, ear pain, congestion, rhinorrhea, sneezing, mouth sores, trouble swallowing, neck pain, neck stiffness, dental problem, voice change, sinus pressure and tinnitus.   Eyes: Negative for photophobia, pain, redness and visual disturbance.  Respiratory: Negative for apnea, cough, choking, chest tightness, shortness of breath and wheezing.   Cardiovascular: Negative for chest pain, palpitations and leg swelling.  Gastrointestinal: Negative for nausea, vomiting, abdominal pain, diarrhea, constipation, blood in stool, abdominal distention, anal bleeding and rectal pain.  Genitourinary: Negative for dysuria, urgency, frequency, hematuria, flank pain, decreased urine volume, discharge, penile swelling, scrotal swelling, difficulty urinating, genital sores and testicular pain.  Musculoskeletal: Positive for back pain, arthralgias and gait problem. Negative for myalgias and joint swelling.  Skin: Negative for color change, rash and wound.  Neurological: Negative for dizziness, tremors, seizures, syncope, facial asymmetry, speech difficulty, weakness, light-headedness, numbness and headaches.  Hematological: Negative for adenopathy. Does not bruise/bleed easily.  Psychiatric/Behavioral: Negative for suicidal ideas, hallucinations, behavioral problems, confusion, sleep disturbance, self-injury, dysphoric mood, decreased concentration and agitation. The patient is not nervous/anxious.        Objective:   Physical Exam  Constitutional: He appears well-developed and well-nourished.       Overweight normal blood pressure  HENT:  Head: Normocephalic  and atraumatic.  Right Ear:  External ear normal.  Left Ear: External ear normal.  Nose: Nose normal.  Mouth/Throat: Oropharynx is clear and moist.  Eyes: Conjunctivae and EOM are normal. Pupils are equal, round, and reactive to light. No scleral icterus.  Neck: Normal range of motion. Neck supple. No JVD present. No thyromegaly present.  Cardiovascular: Regular rhythm, normal heart sounds and intact distal pulses.  Exam reveals no gallop and no friction rub.   No murmur heard. Pulmonary/Chest: Effort normal and breath sounds normal. He exhibits no tenderness.  Abdominal: Soft. Bowel sounds are normal. He exhibits no distension and no mass. There is no tenderness.  Genitourinary: Prostate normal and penis normal.  Musculoskeletal: Normal range of motion. He exhibits no edema and no tenderness.  Lymphadenopathy:    He has no cervical adenopathy.  Neurological: He is alert. He has normal reflexes. No cranial nerve deficit. Coordination normal.  Skin: Skin is warm and dry. No rash noted.  Psychiatric: He has a normal mood and affect. His behavior is normal.          Assessment & Plan:   Annual health assessment DISH Hypertension controlled  Restricted salt heart healthy diet encouraged. More exercise was focused on stretching and attends a weight loss all encouraged. Laboratory profile reviewed

## 2010-08-04 ENCOUNTER — Other Ambulatory Visit: Payer: Self-pay

## 2010-08-04 MED ORDER — METOPROLOL TARTRATE 100 MG PO TABS: ORAL_TABLET | ORAL | Status: DC

## 2010-09-01 NOTE — Assessment & Plan Note (Signed)
[facility]                            [facility] OFFICE NOTE   NAME:Luis Fisher, DIQUAN KASSIS                        MRN:          [id number]  DATE:09/07/2006                            DOB:          [birth year]    This 55 year-old gentleman seen today for an annual exam.  He has a  history of DISH, hypertension and mild dyslipidemia.  He is doing well  today except for his musculoskeletal concerns.  They also has a history  of colonic polyps.  His last colonoscopy was done October 2007.   FAMILY HISTORY:  Reviewed, positive for hypothyroidism and hypertension.   PHYSICAL EXAMINATION:  Revealed a well-developed, mildly overweight male  in no acute distress.  Blood pressure was 120/90, lower on arrival.  Fundi, year nose and throat clear.  NECK:  No bruits or adenopathy.  CHEST:  Clear.  CARDIOVASCULAR:  Normal heart sounds, no murmurs.  ABDOMEN:  Mildly overweight, soft, nontender.  No organomegaly.  External genitalia normal.  RECTAL:  Prostate small and benign.  Stool heme-negative.  EXTREMITIES:  Full peripheral pulses.  NEUROLOGIC:  Negative.  MUSCULOSKELETAL EXAM:  Revealed generalized stiffness and diminished  range-of-motion.   IMPRESSION:  1. Hypertension.  2. Diffuse idiopathic skeletal hyperostosis.  3. Mild dyslipidemia.   DISPOSITION:  In view of his lack of activity and loss of height a bone  density study will be checked.  Medical regimen unchanged except his  metoprolol will be increased to 100 b.i.d.  Reassess in six months.     Gordy Savers, MD  Electronically Signed    PFK/MedQ  DD: 09/07/2006  DT: 09/07/2006  Job #: [id number]

## 2010-09-02 ENCOUNTER — Other Ambulatory Visit: Payer: Self-pay | Admitting: Internal Medicine

## 2010-09-02 ENCOUNTER — Telehealth: Payer: Self-pay

## 2010-09-02 MED ORDER — TESTOSTERONE 12.5 MG/ACT (1%) TD GEL: 8.0000 | TRANSDERMAL | Status: DC

## 2010-09-02 NOTE — Telephone Encounter (Signed)
Spoke with pharmacy gave refill okay. KIK

## 2010-11-19 ENCOUNTER — Telehealth: Payer: Self-pay | Admitting: Internal Medicine

## 2010-11-19 NOTE — Telephone Encounter (Signed)
Pt requesting to send prescriptions to [facility] for  Furosemide 40mg  Tramadol Cyclobenzaprine Zolpidem Ibuprofen 800mg  Metoprolol    Prescriptions for [place](these should remain at [facility])  Androgel Voltaren Amlodipine

## 2010-11-20 MED ORDER — TRAMADOL HCL 50 MG PO TABS: 50.0000 mg | ORAL_TABLET | ORAL | Status: DC | PRN

## 2010-11-20 MED ORDER — ZOLPIDEM TARTRATE 10 MG PO TABS: 10.0000 mg | ORAL_TABLET | Freq: Every evening | ORAL | Status: DC | PRN

## 2010-11-20 MED ORDER — IBUPROFEN 800 MG PO TABS: 800.0000 mg | ORAL_TABLET | Freq: Three times a day (TID) | ORAL | Status: DC | PRN

## 2010-11-20 MED ORDER — METOPROLOL TARTRATE 100 MG PO TABS: ORAL_TABLET | ORAL | Status: DC

## 2010-11-20 MED ORDER — CYCLOBENZAPRINE HCL 10 MG PO TABS: 10.0000 mg | ORAL_TABLET | Freq: Three times a day (TID) | ORAL | Status: DC | PRN

## 2010-11-20 MED ORDER — FUROSEMIDE 40 MG PO TABS: 40.0000 mg | ORAL_TABLET | Freq: Every day | ORAL | Status: DC | PRN

## 2010-11-20 NOTE — Telephone Encounter (Signed)
rx have been sent to the appropriate pharmacies.

## 2011-01-21 ENCOUNTER — Encounter: Payer: Self-pay | Admitting: Internal Medicine

## 2011-01-21 ENCOUNTER — Ambulatory Visit (INDEPENDENT_AMBULATORY_CARE_PROVIDER_SITE_OTHER): Payer: Medicare Other | Admitting: Internal Medicine

## 2011-01-21 VITALS — BP 122/80 | Temp 98.5°F | Wt 225.0 lb

## 2011-01-21 DIAGNOSIS — M948X9 Other specified disorders of cartilage, unspecified sites: Secondary | ICD-10-CM

## 2011-01-21 DIAGNOSIS — Z23 Encounter for immunization: Secondary | ICD-10-CM

## 2011-01-21 DIAGNOSIS — I10 Essential (primary) hypertension: Secondary | ICD-10-CM

## 2011-01-21 DIAGNOSIS — Z0000 Encounter for general adult medical examination without abnormal findings: Secondary | ICD-10-CM

## 2011-01-21 DIAGNOSIS — E291 Testicular hypofunction: Secondary | ICD-10-CM

## 2011-01-21 MED ORDER — CYCLOBENZAPRINE HCL 10 MG PO TABS: 10.0000 mg | ORAL_TABLET | Freq: Three times a day (TID) | ORAL | Status: DC | PRN

## 2011-01-21 MED ORDER — ZOLPIDEM TARTRATE 10 MG PO TABS: 10.0000 mg | ORAL_TABLET | Freq: Every evening | ORAL | Status: DC | PRN

## 2011-01-21 MED ORDER — METOPROLOL TARTRATE 100 MG PO TABS: ORAL_TABLET | ORAL | Status: DC

## 2011-01-21 MED ORDER — IBUPROFEN 800 MG PO TABS: 800.0000 mg | ORAL_TABLET | Freq: Three times a day (TID) | ORAL | Status: DC | PRN

## 2011-01-21 MED ORDER — AMLODIPINE BESYLATE 10 MG PO TABS: 10.0000 mg | ORAL_TABLET | Freq: Every day | ORAL | Status: DC

## 2011-01-21 MED ORDER — FUROSEMIDE 40 MG PO TABS: 40.0000 mg | ORAL_TABLET | Freq: Every day | ORAL | Status: DC | PRN

## 2011-01-21 MED ORDER — TESTOSTERONE 12.5 MG/ACT (1%) TD GEL: 8.0000 | TRANSDERMAL | Status: DC

## 2011-01-21 MED ORDER — TRAMADOL HCL 50 MG PO TABS: 50.0000 mg | ORAL_TABLET | ORAL | Status: DC | PRN

## 2011-01-21 MED ORDER — DICLOFENAC SODIUM 1 % TD GEL: 1.0000 "application " | Freq: Two times a day (BID) | TRANSDERMAL | Status: DC | PRN

## 2011-01-21 NOTE — Patient Instructions (Signed)
It is important that you exercise regularly, at least 20 minutes 3 to 4 times per week.  If you develop chest pain or shortness of breath seek  medical attention.  You need to lose weight.  Consider a lower calorie diet and regular exercise.  Limit your sodium (Salt) intake  Please check your blood pressure on a regular basis.  If it is consistently greater than 150/90, please make an office appointment.  Return in 6 months for follow-up

## 2011-01-21 NOTE — Progress Notes (Signed)
  Subjective:    Patient ID: Luis Fisher, male    DOB: [birth year], 55 y.o.   MRN: [id number]  HPI  Wt Readings from Last 3 Encounters:  01/21/11 225 lb (102.059 kg)  08/03/10 218 lb (98.884 kg)  12/31/09 214 lb (97.07 kg)     Review of Systems     Objective:   Physical Exam        Assessment & Plan:

## 2011-01-21 NOTE — Progress Notes (Signed)
  Subjective:    Patient ID: Luis Fisher, male    DOB: Aug 08, 1955, 55 y.o.   MRN: [id number]  HPI  55 year old patient who is seen today for followup. Over the summer he had some difficulty with right foot pain due to a flare of Morton's neuroma he was not mandatory for 6 weeks. There's been some weight gain since his last visit here 6 months ago he has treated hypertension which has been stable. He has DISH which limits his activities dramatically. He has testosterone deficiency and is on a supplement. In general doing about the same.    Review of Systems  Constitutional: Negative for fever, chills, appetite change and fatigue.  HENT: Negative for hearing loss, ear pain, congestion, sore throat, trouble swallowing, neck stiffness, dental problem, voice change and tinnitus.   Eyes: Negative for pain, discharge and visual disturbance.  Respiratory: Negative for cough, chest tightness, wheezing and stridor.   Cardiovascular: Negative for chest pain, palpitations and leg swelling.  Gastrointestinal: Negative for nausea, vomiting, abdominal pain, diarrhea, constipation, blood in stool and abdominal distention.  Genitourinary: Negative for urgency, hematuria, flank pain, discharge, difficulty urinating and genital sores.  Musculoskeletal: Positive for back pain and arthralgias. Negative for myalgias, joint swelling and gait problem.  Skin: Negative for rash.  Neurological: Negative for dizziness, syncope, speech difficulty, weakness, numbness and headaches.  Hematological: Negative for adenopathy. Does not bruise/bleed easily.  Psychiatric/Behavioral: Negative for behavioral problems and dysphoric mood. The patient is not nervous/anxious.        Objective:   Physical Exam  Constitutional: He is oriented to person, place, and time. He appears well-developed.       Obese. 130/90  HENT:  Head: Normocephalic.  Right Ear: External ear normal.  Left Ear: External ear normal.  Eyes: Conjunctivae  and EOM are normal.  Neck: Normal range of motion.  Cardiovascular: Normal rate and normal heart sounds.   Pulmonary/Chest: Breath sounds normal.  Abdominal: Bowel sounds are normal.  Musculoskeletal: He exhibits no edema and no tenderness.       Marked decrease range of motion  Neurological: He is alert and oriented to person, place, and time.  Psychiatric: He has a normal mood and affect. His behavior is normal.          Assessment & Plan:   Hypertension controlled. Will continue present regimen all medications refilled Testosterone deficiency DISH. We'll continue symptomatic treatment  Recheck in 6 months for his annual physical

## 2011-03-19 ENCOUNTER — Other Ambulatory Visit: Payer: Self-pay | Admitting: Internal Medicine

## 2011-05-04 ENCOUNTER — Encounter: Payer: Self-pay | Admitting: Gastroenterology

## 2011-05-18 ENCOUNTER — Other Ambulatory Visit: Payer: Self-pay | Admitting: Internal Medicine

## 2011-05-19 NOTE — Telephone Encounter (Signed)
Pt last seen 01/21/11.  Rx last filled 03/19/11 300g x 2 rf.  Pls advise.

## 2011-05-20 NOTE — Telephone Encounter (Signed)
ok 

## 2011-07-06 ENCOUNTER — Encounter: Payer: Self-pay | Admitting: Internal Medicine

## 2011-07-06 ENCOUNTER — Ambulatory Visit (INDEPENDENT_AMBULATORY_CARE_PROVIDER_SITE_OTHER): Payer: Medicare Other | Admitting: Internal Medicine

## 2011-07-06 VITALS — BP 120/80 | Temp 98.8°F | Wt 228.0 lb

## 2011-07-06 DIAGNOSIS — I10 Essential (primary) hypertension: Secondary | ICD-10-CM

## 2011-07-06 DIAGNOSIS — L089 Local infection of the skin and subcutaneous tissue, unspecified: Secondary | ICD-10-CM

## 2011-07-06 DIAGNOSIS — L723 Sebaceous cyst: Secondary | ICD-10-CM | POA: Diagnosis not present

## 2011-07-06 MED ORDER — CEPHALEXIN 750 MG PO CAPS: 750.0000 mg | ORAL_CAPSULE | Freq: Three times a day (TID) | ORAL | Status: DC

## 2011-07-06 NOTE — Patient Instructions (Signed)
Take your antibiotic as prescribed until ALL of it is gone, but stop if you develop a rash, swelling, or any side effects of the medication.  Contact our office as soon as possible if  there are side effects of the medication.  Call or return to clinic prn if these symptoms worsen or fail to improve as anticipated.  

## 2011-07-06 NOTE — Progress Notes (Signed)
  Subjective:    Patient ID: Luis Fisher, male    DOB: 1956/04/09, 56 y.o.   MRN: [id number]  HPI  56 year old patient who has noted some mild discomfort and soft tissue swelling in the perineal area;  this has been present intermittently for 2 weeks and seems to be improving.    Review of Systems  Skin: Positive for wound.       Objective:   Physical Exam  Genitourinary:       A 3 cm area of soft tissue swelling with very minimal tenderness without erythema or fluctuance noted in the perineal area just right of the midline          Assessment & Plan:   Sebaceous cyst. Does not appear grossly infected at this time. Patient's pain seems to be improving we'll treat with sitz baths and antibiotic therapy and observe. Hopefully can avoid I&D

## 2011-07-22 ENCOUNTER — Ambulatory Visit: Payer: Medicare Other | Admitting: Internal Medicine

## 2011-07-23 ENCOUNTER — Other Ambulatory Visit: Payer: Self-pay

## 2011-07-23 MED ORDER — CLOBETASOL PROPIONATE 0.05 % EX CREA: 1.0000 "application " | TOPICAL_CREAM | Freq: Two times a day (BID) | CUTANEOUS | Status: DC

## 2011-08-03 ENCOUNTER — Ambulatory Visit: Payer: Medicare Other | Admitting: Internal Medicine

## 2011-08-20 ENCOUNTER — Other Ambulatory Visit: Payer: Self-pay | Admitting: Internal Medicine

## 2011-09-17 ENCOUNTER — Other Ambulatory Visit: Payer: Self-pay | Admitting: Internal Medicine

## 2011-12-06 ENCOUNTER — Ambulatory Visit (INDEPENDENT_AMBULATORY_CARE_PROVIDER_SITE_OTHER): Payer: Medicare Other | Admitting: Internal Medicine

## 2011-12-06 ENCOUNTER — Encounter: Payer: Self-pay | Admitting: Internal Medicine

## 2011-12-06 VITALS — BP 130/80 | Temp 98.3°F | Wt 226.0 lb

## 2011-12-06 DIAGNOSIS — M948X9 Other specified disorders of cartilage, unspecified sites: Secondary | ICD-10-CM | POA: Diagnosis not present

## 2011-12-06 DIAGNOSIS — I10 Essential (primary) hypertension: Secondary | ICD-10-CM | POA: Diagnosis not present

## 2011-12-06 DIAGNOSIS — M7660 Achilles tendinitis, unspecified leg: Secondary | ICD-10-CM

## 2011-12-06 NOTE — Patient Instructions (Addendum)
Orthopedic referral as discussed 

## 2011-12-06 NOTE — Progress Notes (Signed)
  Subjective:    Patient ID: Luis Fisher, male    DOB: 1955-09-16, 56 y.o.   MRN: [id number]  HPI  56 year old patient has a long history of diffuse idiopathic skeletal hyperostosis. He has chronic pain and marked limitation of mobility. For the past several months he has had worsening low calf and heel discomfort. This further has compromised his ambulation. He has treated hypertension which has been stable.    Review of Systems  Constitutional: Negative for fever, chills, appetite change and fatigue.  HENT: Negative for hearing loss, ear pain, congestion, sore throat, trouble swallowing, neck stiffness, dental problem, voice change and tinnitus.   Eyes: Negative for pain, discharge and visual disturbance.  Respiratory: Negative for cough, chest tightness, wheezing and stridor.   Cardiovascular: Negative for chest pain, palpitations and leg swelling.  Gastrointestinal: Negative for nausea, vomiting, abdominal pain, diarrhea, constipation, blood in stool and abdominal distention.  Genitourinary: Negative for urgency, hematuria, flank pain, discharge, difficulty urinating and genital sores.  Musculoskeletal: Positive for back pain, arthralgias and gait problem. Negative for myalgias and joint swelling.  Skin: Negative for rash.  Neurological: Negative for dizziness, syncope, speech difficulty, weakness, numbness and headaches.  Hematological: Negative for adenopathy. Does not bruise/bleed easily.  Psychiatric/Behavioral: Negative for behavioral problems and dysphoric mood. The patient is not nervous/anxious.        Objective:   Physical Exam  Constitutional: He is oriented to person, place, and time. He appears well-developed.  HENT:  Head: Normocephalic.  Right Ear: External ear normal.  Left Ear: External ear normal.  Eyes: Conjunctivae and EOM are normal.  Neck: Normal range of motion.  Cardiovascular: Normal rate and normal heart sounds.   Pulmonary/Chest: Breath sounds normal.    Abdominal: Bowel sounds are normal.  Musculoskeletal: Normal range of motion. He exhibits no edema and no tenderness.       Mild lower calf tenderness as well as heel  tenderness at the insertion site of the Achilles tendon  Neurological: He is alert and oriented to person, place, and time.  Psychiatric: He has a normal mood and affect. His behavior is normal.          Assessment & Plan:   DISH Bilateral heel pain;  probable calcific Achilles tendinitis-and this has further limited his ability to ambulate. We'll set up for orthopedic referral Hypertension stable

## 2011-12-28 DIAGNOSIS — M7660 Achilles tendinitis, unspecified leg: Secondary | ICD-10-CM | POA: Diagnosis not present

## 2012-01-10 ENCOUNTER — Encounter: Payer: Self-pay | Admitting: Gastroenterology

## 2012-01-11 ENCOUNTER — Ambulatory Visit (INDEPENDENT_AMBULATORY_CARE_PROVIDER_SITE_OTHER): Payer: Medicare Other | Admitting: Internal Medicine

## 2012-01-11 ENCOUNTER — Encounter: Payer: Self-pay | Admitting: Internal Medicine

## 2012-01-11 VITALS — BP 122/80 | Temp 98.0°F

## 2012-01-11 DIAGNOSIS — K611 Rectal abscess: Secondary | ICD-10-CM

## 2012-01-11 DIAGNOSIS — K612 Anorectal abscess: Secondary | ICD-10-CM | POA: Diagnosis not present

## 2012-01-11 MED ORDER — HYDROCODONE-ACETAMINOPHEN 7.5-500 MG PO TABS: 2.0000 | ORAL_TABLET | Freq: Four times a day (QID) | ORAL | Status: DC | PRN

## 2012-01-11 MED ORDER — CEPHALEXIN 750 MG PO CAPS: 750.0000 mg | ORAL_CAPSULE | Freq: Three times a day (TID) | ORAL | Status: AC

## 2012-01-11 NOTE — Patient Instructions (Addendum)
Call for general surgical referral if unimproved  Take your antibiotic as prescribed until ALL of it is gone, but stop if you develop a rash, swelling, or any side effects of the medication.  Contact our office as soon as possible if  there are side effects of the medication.Sitz Bath A sitz bath is a warm water bath taken in the sitting position that covers only the hips and buttocks. It may be used for either healing or hygiene purposes. Sitz baths are also used to relieve pain, itching, or muscle spasms. The water may contain medicine. Moist heat will help you heal and relax.   HOME CARE INSTRUCTIONS    Fill the bathtub half full with warm water.   Sit in the water and open the drain a little.   Turn on the warm water to keep the tub half full. Keep the water running constantly.   Soak in the water for 15 to 20 minutes.   After the sitz bath, pat the affected area dry first.   Take 3 to 4 sitz baths a day.  SEEK MEDICAL CARE IF:   You get worse instead of better. Stop the sitz baths if you get worse. MAKE SURE YOU:  Understand these instructions.   Will watch your condition.   Will get help right away if you are not doing well or get worse.  Document Released: 12/27/2003 Document Revised: 03/25/2011 Document Reviewed: 07/03/2010 [facility] Patient Information 2012 [place], [place].

## 2012-01-11 NOTE — Progress Notes (Signed)
Subjective:    Patient ID: Luis Fisher, male    DOB: [birth year], 56 y.o.   MRN: [id number]  HPI  56 year old patient who presents with a two-day history of pain and swelling in the right rectal area. He was treated for a infected perirectal cyst in March of this year. He responded nicely to antibiotic therapy.  Past Medical History  Diagnosis Date  . Hypertension   . Depression   . DISH (diffuse idiopathic skeletal hyperostosis)   . Hx of colonic polyps     History   Social History  . Marital Status: Married    Spouse Name: N/A    Number of Children: N/A  . Years of Education: N/A   Occupational History  . Not on file.   Social History Main Topics  . Smoking status: Former Smoker    Types: Cigars    Quit date: 04/19/1993  . Smokeless tobacco: Never Used  . Alcohol Use: Yes  . Drug Use: No  . Sexually Active: Not on file   Other Topics Concern  . Not on file   Social History Narrative  . No narrative on file    No past surgical history on file.  Family History  Problem Relation Age of Onset  . Hypothyroidism Mother   . Hodgkin's lymphoma Sister   . Hypothyroidism Sister   . Hypertension Sister   . Hypertension Brother     No Known Allergies  Current Outpatient Prescriptions on File Prior to Visit  Medication Sig Dispense Refill  . ANDROGEL PUMP 1.25 GM/ACT (1%) GEL USE 8 PUMPS EVERY MORNING.  300 Bottle  2  . aspirin 81 MG tablet Take 81 mg by mouth daily.        . clobetasol cream (TEMOVATE) 0.05 % Apply 1 application topically 2 (two) times daily.  60 g  3  . diclofenac sodium (VOLTAREN) 1 % GEL Apply 1 application topically 2 (two) times daily as needed.  1 Tube  6  . furosemide (LASIX) 40 MG tablet Take 1 tablet (40 mg total) by mouth daily as needed.  90 tablet  6  . ibuprofen (ADVIL,MOTRIN) 800 MG tablet Take 1 tablet (800 mg total) by mouth 3 (three) times daily as needed.  270 tablet  6  . metoprolol (LOPRESSOR) 100 MG tablet 1 and 1/2 tablet  twice daily  180 tablet  3  . NORVASC 10 MG tablet TAKE 1 TABLET EACH DAY.  90 each  1  . traMADol (ULTRAM) 50 MG tablet Take 1 tablet (50 mg total) by mouth as needed.  90 tablet  6  . zolpidem (AMBIEN) 10 MG tablet Take 1 tablet (10 mg total) by mouth at bedtime as needed.  30 tablet  2    BP 122/80  Temp 98 F (36.7 C) (Oral)       Review of Systems  Constitutional: Negative for fever and chills.  Skin: Positive for wound.       Objective:   Physical Exam  Constitutional:       Afebrile no distress   Genitourinary:       3-to 4 cm area of soft tissue swelling with erythema and tenderness in the right perirectal area          Assessment & Plan:   Early perirectal abscess. The area is tender and indurated but not fluctuant. Doubtful I&D would yield much exudate at this stage. Patient did respond nicely to antibiotic therapy approximately 6 months ago. Will retreat  with antibiotics and sitz baths. Patient is aware of possible need for I&D. If there is any clinical worsening he is to call for referral to general surgery

## 2012-01-24 DIAGNOSIS — M7660 Achilles tendinitis, unspecified leg: Secondary | ICD-10-CM | POA: Diagnosis not present

## 2012-01-25 DIAGNOSIS — M7660 Achilles tendinitis, unspecified leg: Secondary | ICD-10-CM | POA: Diagnosis not present

## 2012-01-26 ENCOUNTER — Other Ambulatory Visit: Payer: Self-pay | Admitting: Orthopedic Surgery

## 2012-01-26 DIAGNOSIS — M7660 Achilles tendinitis, unspecified leg: Secondary | ICD-10-CM | POA: Diagnosis not present

## 2012-01-26 DIAGNOSIS — M542 Cervicalgia: Secondary | ICD-10-CM

## 2012-01-28 DIAGNOSIS — M7660 Achilles tendinitis, unspecified leg: Secondary | ICD-10-CM | POA: Diagnosis not present

## 2012-01-31 DIAGNOSIS — M7660 Achilles tendinitis, unspecified leg: Secondary | ICD-10-CM | POA: Diagnosis not present

## 2012-02-02 DIAGNOSIS — M7660 Achilles tendinitis, unspecified leg: Secondary | ICD-10-CM | POA: Diagnosis not present

## 2012-02-04 DIAGNOSIS — M7660 Achilles tendinitis, unspecified leg: Secondary | ICD-10-CM | POA: Diagnosis not present

## 2012-02-21 ENCOUNTER — Ambulatory Visit (INDEPENDENT_AMBULATORY_CARE_PROVIDER_SITE_OTHER): Payer: Medicare Other | Admitting: Internal Medicine

## 2012-02-21 ENCOUNTER — Encounter: Payer: Self-pay | Admitting: Internal Medicine

## 2012-02-21 ENCOUNTER — Telehealth: Payer: Self-pay | Admitting: Family Medicine

## 2012-02-21 VITALS — BP 126/80 | HR 74 | Temp 97.7°F | Resp 18 | Ht 68.5 in | Wt 223.0 lb

## 2012-02-21 DIAGNOSIS — Z23 Encounter for immunization: Secondary | ICD-10-CM | POA: Diagnosis not present

## 2012-02-21 DIAGNOSIS — M948X9 Other specified disorders of cartilage, unspecified sites: Secondary | ICD-10-CM

## 2012-02-21 DIAGNOSIS — E291 Testicular hypofunction: Secondary | ICD-10-CM

## 2012-02-21 DIAGNOSIS — I10 Essential (primary) hypertension: Secondary | ICD-10-CM

## 2012-02-21 DIAGNOSIS — Z0000 Encounter for general adult medical examination without abnormal findings: Secondary | ICD-10-CM

## 2012-02-21 LAB — CBC WITH DIFFERENTIAL/PLATELET
Basophils Relative: 0.4 % (ref 0.0–3.0)
Eosinophils Relative: 0.8 % (ref 0.0–5.0)
HCT: 44.9 % (ref 39.0–52.0)
Hemoglobin: 15.3 g/dL (ref 13.0–17.0)
MCHC: 34.1 g/dL (ref 30.0–36.0)
MCV: 94.5 fl (ref 78.0–100.0)
Monocytes Absolute: 0.5 10*3/uL (ref 0.1–1.0)
Neutro Abs: 3.5 10*3/uL (ref 1.4–7.7)
Neutrophils Relative %: 60.7 % (ref 43.0–77.0)
RBC: 4.75 Mil/uL (ref 4.22–5.81)
WBC: 5.8 10*3/uL (ref 4.5–10.5)

## 2012-02-21 LAB — TSH: TSH: 1.12 u[IU]/mL (ref 0.35–5.50)

## 2012-02-21 LAB — LIPID PANEL
Cholesterol: 241 mg/dL — ABNORMAL HIGH (ref 0–200)
HDL: 45.3 mg/dL (ref >39.00)
Triglycerides: 353 mg/dL — ABNORMAL HIGH (ref 0.0–149.0)
VLDL: 70.6 mg/dL — ABNORMAL HIGH (ref 0.0–40.0)

## 2012-02-21 LAB — COMPREHENSIVE METABOLIC PANEL
AST: 27 U/L (ref 0–37)
Albumin: 4.1 g/dL (ref 3.5–5.2)
Alkaline Phosphatase: 16 U/L — ABNORMAL LOW (ref 39–117)
BUN: 15 mg/dL (ref 6–23)
Creatinine, Ser: 1 mg/dL (ref 0.4–1.5)
Glucose, Bld: 95 mg/dL (ref 70–99)
Total Bilirubin: 0.8 mg/dL (ref 0.3–1.2)

## 2012-02-21 MED ORDER — FUROSEMIDE 40 MG PO TABS: 40.0000 mg | ORAL_TABLET | Freq: Every day | ORAL | Status: DC | PRN

## 2012-02-21 MED ORDER — DICLOFENAC SODIUM 1 % TD GEL: 1.0000 "application " | Freq: Two times a day (BID) | TRANSDERMAL | Status: DC | PRN

## 2012-02-21 MED ORDER — TRAMADOL HCL 50 MG PO TABS: 50.0000 mg | ORAL_TABLET | ORAL | Status: DC | PRN

## 2012-02-21 MED ORDER — HYDROCODONE-ACETAMINOPHEN 7.5-500 MG PO TABS: 2.0000 | ORAL_TABLET | Freq: Four times a day (QID) | ORAL | Status: DC | PRN

## 2012-02-21 MED ORDER — METOPROLOL TARTRATE 100 MG PO TABS: ORAL_TABLET | ORAL | Status: DC

## 2012-02-21 MED ORDER — ZOLPIDEM TARTRATE 10 MG PO TABS: 10.0000 mg | ORAL_TABLET | Freq: Every evening | ORAL | Status: DC | PRN

## 2012-02-21 MED ORDER — AMLODIPINE BESYLATE 10 MG PO TABS: 10.0000 mg | ORAL_TABLET | Freq: Every day | ORAL | Status: DC

## 2012-02-21 MED ORDER — TESTOSTERONE 12.5 MG/ACT (1%) TD GEL: 8.0000 "application " | Freq: Every morning | TRANSDERMAL | Status: DC

## 2012-02-21 MED ORDER — METOPROLOL TARTRATE 100 MG PO TABS: 100.0000 mg | ORAL_TABLET | Freq: Two times a day (BID) | ORAL | Status: DC

## 2012-02-21 NOTE — Progress Notes (Signed)
Patient ID: Luis Fisher, male   DOB: 07/04/1955, 56 y.o.   MRN: [id number]  Subjective:    Patient ID: Luis Fisher, male    DOB: 02-Jan-1956, 56 y.o.   MRN: [id number]  HPI  56 year-old patient who is seen today for an annual examination. He has been disabled due to DISH. He continues to have pain and worsening stiffness and functional disability. Disability forms are filled out periodically. He has treated hypertension testosterone deficiency and history of depression. He also has a history colonic polyps and has had recent followup colonoscopy. He is now followed by orthopedics due to calcific Achilles tendinitis.  Alcohol-Tobacco  Smoking Status: quit   Caffeine-Diet-Exercise  Does Patient Exercise: yes   Allergies:  1) ! * Amitriptyline  Past History:  Past Medical History:  Hypertension  DISH  Colonic polyps, hx of  Depression  testosterone deficiency   Past Surgical History:  Reviewed history from 06/16/2007 and no changes required.  Colonoscopy January 23 2006   Family History:  Reviewed history from 06/16/2007 and no changes required.  Family History Hypertension  Family History of Hodgkin's Disease  father died age 67, motor vehicle accident  mother history of hypothyroidism  3 brothers, history hypertension  4 sisters, history of Hodkins disease,hypothyroidism , hypertension   Social History:  Married  Regular exercise-yes  Smoking Status: quit  Does Patient Exercise: yes   Past Medical History  Diagnosis Date  . Hypertension   . Depression   . DISH (diffuse idiopathic skeletal hyperostosis)   . Hx of colonic polyps    No past surgical history on file.  reports that he quit smoking about 18 years ago. His smoking use included Cigars. He has never used smokeless tobacco. He reports that he drinks alcohol. He reports that he does not use illicit drugs. family history includes Hodgkin's lymphoma in his sister; Hypertension in his brother and sister; and  Hypothyroidism in his mother and sister. No Known Allergies  1. Risk factors, based on past  M,S,F history  cardiovascular risk factors include hypertension  2.  Physical activities: Fairly sedentary due to orthopedic limitations with pain stiffness and weakness. He does go to the gym 3 times weekly for stretching and modest exercise  3.  Depression/mood: History of depression presently stable  4.  Hearing: No hearing deficits  5.  ADL's: Requires assistance with most aspects of daily living due to pain and stiffness  6.  Fall risk: Moderately high  7.  Home safety: No problems identified  8.  Height weight, and visual acuity; height and weight stable the changes is visual acuity  9.  Counseling: Heart healthy diet regular exercise with stretching and weight loss all encouraged  10. Lab orders based on risk factors: Laboratory profile including PSA and lipid profile will be reviewed  11. Referral : Not appropriate at this time  12. Care plan: Continue heart healthy diet exercises with stretching modified salt diet encouraged  13. Cognitive assessment: Alert and oriented with normal affect no cognitive dysfunction      Review of Systems  Constitutional: Negative for fever, chills, activity change, appetite change and fatigue.  HENT: Negative for hearing loss, ear pain, congestion, rhinorrhea, sneezing, mouth sores, trouble swallowing, neck pain, neck stiffness, dental problem, voice change, sinus pressure and tinnitus.   Eyes: Negative for photophobia, pain, redness and visual disturbance.  Respiratory: Negative for apnea, cough, choking, chest tightness, shortness of breath and wheezing.   Cardiovascular: Negative for chest  pain, palpitations and leg swelling.  Gastrointestinal: Negative for nausea, vomiting, abdominal pain, diarrhea, constipation, blood in stool, abdominal distention, anal bleeding and rectal pain.  Genitourinary: Negative for dysuria, urgency, frequency,  hematuria, flank pain, decreased urine volume, discharge, penile swelling, scrotal swelling, difficulty urinating, genital sores and testicular pain.  Musculoskeletal: Positive for back pain, arthralgias and gait problem. Negative for myalgias and joint swelling.  Skin: Negative for color change, rash and wound.  Neurological: Negative for dizziness, tremors, seizures, syncope, facial asymmetry, speech difficulty, weakness, light-headedness, numbness and headaches.  Hematological: Negative for adenopathy. Does not bruise/bleed easily.  Psychiatric/Behavioral: Negative for suicidal ideas, hallucinations, behavioral problems, confusion, sleep disturbance, self-injury, dysphoric mood, decreased concentration and agitation. The patient is not nervous/anxious.        Objective:   Physical Exam  Constitutional: He appears well-developed and well-nourished.       Overweight normal blood pressure  HENT:  Head: Normocephalic and atraumatic.  Right Ear: External ear normal.  Left Ear: External ear normal.  Nose: Nose normal.  Mouth/Throat: Oropharynx is clear and moist.  Eyes: Conjunctivae normal and EOM are normal. Pupils are equal, round, and reactive to light. No scleral icterus.  Neck: Normal range of motion. Neck supple. No JVD present. No thyromegaly present.  Cardiovascular: Regular rhythm, normal heart sounds and intact distal pulses.  Exam reveals no gallop and no friction rub.   No murmur heard. Pulmonary/Chest: Effort normal and breath sounds normal. He exhibits no tenderness.  Abdominal: Soft. Bowel sounds are normal. He exhibits no distension and no mass. There is no tenderness.  Genitourinary: Prostate normal and penis normal.  Musculoskeletal: Normal range of motion. He exhibits no edema and no tenderness.  Lymphadenopathy:    He has no cervical adenopathy.  Neurological: He is alert. He has normal reflexes. No cranial nerve deficit. Coordination normal.  Skin: Skin is warm and  dry. No rash noted.  Psychiatric: He has a normal mood and affect. His behavior is normal.          Assessment & Plan:   Annual health assessment DISH Hypertension controlled  Restricted salt heart healthy diet encouraged. More exercise was focused on stretching and attends a weight loss all encouraged. Laboratory profile reviewed  Schedule your colonoscopy to help detect colon cancer.

## 2012-02-21 NOTE — Telephone Encounter (Signed)
New rx faxed to [facility]

## 2012-02-21 NOTE — Patient Instructions (Signed)
Limit your sodium (Salt) intake  Please check your blood pressure on a regular basis.  If it is consistently greater than 150/90, please make an office appointment.  Return in 6 months for follow-up   

## 2012-02-21 NOTE — Telephone Encounter (Signed)
Patient just brought in rx for metoprolol (LOPRESSOR) 100 MG tablet Pharmacy states there are two sets of directions on there:  1 tablet PO twice a day 1 1/2 tablet twice a day.  Please call to clarify. Thank you.

## 2012-02-24 ENCOUNTER — Ambulatory Visit: Payer: Medicare Other | Admitting: Internal Medicine

## 2012-06-21 ENCOUNTER — Other Ambulatory Visit: Payer: Self-pay | Admitting: *Deleted

## 2012-06-21 MED ORDER — TESTOSTERONE 12.5 MG/ACT (1%) TD GEL: 8.0000 "application " | Freq: Every morning | TRANSDERMAL | Status: DC

## 2012-06-21 NOTE — Telephone Encounter (Signed)
Rx for Androgel refill signed and faxed to pharmacy.

## 2012-06-28 ENCOUNTER — Other Ambulatory Visit: Payer: Self-pay | Admitting: *Deleted

## 2012-06-28 MED ORDER — TESTOSTERONE 20.25 MG/ACT (1.62%) TD GEL: 5.0000 "application " | TRANSDERMAL | Status: DC

## 2012-06-29 ENCOUNTER — Other Ambulatory Visit: Payer: Self-pay | Admitting: *Deleted

## 2012-06-29 MED ORDER — TESTOSTERONE 20.25 MG/ACT (1.62%) TD GEL: 4.0000 "application " | TRANSDERMAL | Status: DC

## 2012-07-17 DIAGNOSIS — M79609 Pain in unspecified limb: Secondary | ICD-10-CM | POA: Diagnosis not present

## 2012-07-17 DIAGNOSIS — J45909 Unspecified asthma, uncomplicated: Secondary | ICD-10-CM | POA: Diagnosis not present

## 2012-07-17 DIAGNOSIS — S93609A Unspecified sprain of unspecified foot, initial encounter: Secondary | ICD-10-CM | POA: Diagnosis not present

## 2012-07-17 DIAGNOSIS — M25579 Pain in unspecified ankle and joints of unspecified foot: Secondary | ICD-10-CM | POA: Diagnosis not present

## 2012-07-17 DIAGNOSIS — R0602 Shortness of breath: Secondary | ICD-10-CM | POA: Diagnosis not present

## 2012-09-05 DIAGNOSIS — M25559 Pain in unspecified hip: Secondary | ICD-10-CM | POA: Diagnosis not present

## 2012-09-14 ENCOUNTER — Encounter: Payer: Self-pay | Admitting: Internal Medicine

## 2012-09-14 ENCOUNTER — Ambulatory Visit (INDEPENDENT_AMBULATORY_CARE_PROVIDER_SITE_OTHER): Payer: Medicare Other | Admitting: Internal Medicine

## 2012-09-14 VITALS — BP 120/50 | Temp 98.6°F | Wt 216.0 lb

## 2012-09-14 DIAGNOSIS — L723 Sebaceous cyst: Secondary | ICD-10-CM

## 2012-09-14 DIAGNOSIS — E291 Testicular hypofunction: Secondary | ICD-10-CM | POA: Diagnosis not present

## 2012-09-14 DIAGNOSIS — I10 Essential (primary) hypertension: Secondary | ICD-10-CM

## 2012-09-14 MED ORDER — ZOLPIDEM TARTRATE 10 MG PO TABS: 10.0000 mg | ORAL_TABLET | Freq: Every evening | ORAL | Status: DC | PRN

## 2012-09-14 MED ORDER — TRAMADOL HCL 50 MG PO TABS: 50.0000 mg | ORAL_TABLET | ORAL | Status: DC | PRN

## 2012-09-14 MED ORDER — TESTOSTERONE 20.25 MG/ACT (1.62%) TD GEL: 4.0000 "application " | TRANSDERMAL | Status: DC

## 2012-09-14 MED ORDER — HYDROCODONE-ACETAMINOPHEN 7.5-500 MG PO TABS: 2.0000 | ORAL_TABLET | Freq: Four times a day (QID) | ORAL | Status: DC | PRN

## 2012-09-14 NOTE — Progress Notes (Signed)
Subjective:    Patient ID: Luis Fisher, male    DOB: [birth year], 57 y.o.   MRN: [id number]  HPI  57 year old patient who has treated hypertension. He also has a history of DISH which severely limits his activity. He has developed a bilateral hip pain left greater than the right with further worsens his pain and mobility.  He is anticipating left total hip replacement surgery later this summer and possibly followed by right total hip replacement surgery by the end of the year He denies any cardiopulmonary complaints although his activity is impaired no history of chest pain or shortness of breath. No cardiac history. No recent EKG but these have been normal  The patient also complains of a chronic intermittently draining perineal cyst that has been bothersome for the past year;  presently is spontaneously draining  Past Medical History  Diagnosis Date  . Hypertension   . Depression   . DISH (diffuse idiopathic skeletal hyperostosis)   . Hx of colonic polyps     History   Social History  . Marital Status: Married    Spouse Name: N/A    Number of Children: N/A  . Years of Education: N/A   Occupational History  . Not on file.   Social History Main Topics  . Smoking status: Former Smoker    Types: Cigars    Quit date: 04/19/1993  . Smokeless tobacco: Never Used  . Alcohol Use: Yes  . Drug Use: No  . Sexually Active: Not on file   Other Topics Concern  . Not on file   Social History Narrative  . No narrative on file    History reviewed. No pertinent past surgical history.  Family History  Problem Relation Age of Onset  . Hypothyroidism Mother   . Hodgkin's lymphoma Sister   . Hypothyroidism Sister   . Hypertension Sister   . Hypertension Brother     No Known Allergies  Current Outpatient Prescriptions on File Prior to Visit  Medication Sig Dispense Refill  . amLODipine (NORVASC) 10 MG tablet Take 1 tablet (10 mg total) by mouth daily.  90 tablet  6  . aspirin  81 MG tablet Take 81 mg by mouth daily.        . clobetasol cream (TEMOVATE) 0.05 % Apply 1 application topically 2 (two) times daily.  60 g  3  . diclofenac sodium (VOLTAREN) 1 % GEL Apply 1 application topically 2 (two) times daily as needed.  100 Tube  6  . furosemide (LASIX) 40 MG tablet Take 1 tablet (40 mg total) by mouth daily as needed.  90 tablet  6  . ibuprofen (ADVIL,MOTRIN) 800 MG tablet Take 1 tablet (800 mg total) by mouth 3 (three) times daily as needed.  270 tablet  6  . metoprolol (LOPRESSOR) 100 MG tablet 1 and 1/2 tablet twice daily  180 tablet  6   No current facility-administered medications on file prior to visit.    BP 120/50  Temp(Src) 98.6 F (37 C) (Oral)  Wt 216 lb (97.977 kg)  BMI 32.36 kg/m2       Review of Systems  Constitutional: Negative for fever, chills, appetite change and fatigue.  HENT: Negative for hearing loss, ear pain, congestion, sore throat, trouble swallowing, neck stiffness, dental problem, voice change and tinnitus.   Eyes: Negative for pain, discharge and visual disturbance.  Respiratory: Negative for cough, chest tightness, wheezing and stridor.   Cardiovascular: Negative for chest pain, palpitations and leg swelling.  Gastrointestinal: Negative for nausea, vomiting, abdominal pain, diarrhea, constipation, blood in stool and abdominal distention.  Genitourinary: Negative for urgency, hematuria, flank pain, discharge, difficulty urinating and genital sores.  Musculoskeletal: Positive for back pain, arthralgias and gait problem. Negative for myalgias and joint swelling.  Skin: Negative for rash.  Neurological: Negative for dizziness, syncope, speech difficulty, weakness, numbness and headaches.  Hematological: Negative for adenopathy. Does not bruise/bleed easily.  Psychiatric/Behavioral: Negative for behavioral problems and dysphoric mood. The patient is not nervous/anxious.        Objective:   Physical Exam  Constitutional: He is  oriented to person, place, and time. He appears well-developed.  Overweight Blood pressure nicely controlled Pain with movement with restricted mobility  HENT:  Head: Normocephalic.  Right Ear: External ear normal.  Left Ear: External ear normal.  Eyes: Conjunctivae and EOM are normal.  Neck: Normal range of motion.  Cardiovascular: Normal rate, regular rhythm and intact distal pulses.   Murmur heard. Pulmonary/Chest: Breath sounds normal.  Abdominal: Bowel sounds are normal.  Musculoskeletal: Normal range of motion. He exhibits no edema and no tenderness.  Neurological: He is alert and oriented to person, place, and time.  Psychiatric: He has a normal mood and affect. His behavior is normal.          Assessment & Plan:   DISH Osteoarthritis with bilateral hip pain Hypertension well controlled Chronic intermittent drainage of a sebaceous cyst. Will set up for general surgical evaluation to consider excision. Patient is anxious for surgery to proceed on schedule and not be delayed by a possible soft tissue infection Schedule CPX in 6 months

## 2012-09-14 NOTE — Patient Instructions (Addendum)
Limit your sodium (Salt) intake  You need to lose weight.  Consider a lower calorie diet   Return in 6 months for follow-up

## 2012-09-15 ENCOUNTER — Ambulatory Visit (INDEPENDENT_AMBULATORY_CARE_PROVIDER_SITE_OTHER): Payer: Medicare Other | Admitting: Surgery

## 2012-09-15 ENCOUNTER — Encounter (INDEPENDENT_AMBULATORY_CARE_PROVIDER_SITE_OTHER): Payer: Self-pay | Admitting: Surgery

## 2012-09-15 VITALS — BP 118/82 | HR 70 | Temp 97.8°F | Resp 18 | Ht 69.0 in | Wt 214.8 lb

## 2012-09-15 DIAGNOSIS — K611 Rectal abscess: Secondary | ICD-10-CM

## 2012-09-15 DIAGNOSIS — K612 Anorectal abscess: Secondary | ICD-10-CM | POA: Diagnosis not present

## 2012-09-15 NOTE — Patient Instructions (Signed)
Shower after BMs Apply Neopsoring daily to the drained abscess
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
## 2012-09-18 DIAGNOSIS — H11159 Pinguecula, unspecified eye: Secondary | ICD-10-CM | POA: Diagnosis not present

## 2012-09-18 DIAGNOSIS — H18419 Arcus senilis, unspecified eye: Secondary | ICD-10-CM | POA: Diagnosis not present

## 2012-09-18 DIAGNOSIS — H43399 Other vitreous opacities, unspecified eye: Secondary | ICD-10-CM | POA: Diagnosis not present

## 2012-09-18 DIAGNOSIS — H2510 Age-related nuclear cataract, unspecified eye: Secondary | ICD-10-CM | POA: Diagnosis not present

## 2012-09-27 ENCOUNTER — Ambulatory Visit (INDEPENDENT_AMBULATORY_CARE_PROVIDER_SITE_OTHER): Payer: Self-pay | Admitting: Surgery

## 2012-09-28 ENCOUNTER — Ambulatory Visit (INDEPENDENT_AMBULATORY_CARE_PROVIDER_SITE_OTHER): Payer: Medicare Other | Admitting: Surgery

## 2012-09-28 ENCOUNTER — Encounter (INDEPENDENT_AMBULATORY_CARE_PROVIDER_SITE_OTHER): Payer: Self-pay | Admitting: Surgery

## 2012-09-28 VITALS — BP 120/72 | HR 72 | Temp 98.1°F | Resp 16 | Ht 69.0 in | Wt 213.8 lb

## 2012-09-28 DIAGNOSIS — K612 Anorectal abscess: Secondary | ICD-10-CM

## 2012-09-28 DIAGNOSIS — K611 Rectal abscess: Secondary | ICD-10-CM

## 2012-09-28 NOTE — Patient Instructions (Signed)
Thanks for your patience.  If you need further assistance after leaving the office, please call our office and speak with a [facility] nurse.  [phone].  If you want to leave a message for Dr. Brooks Stotz, please call his office phone at [phone]. 

## 2012-09-28 NOTE — Progress Notes (Signed)
Luis Fisher 57 y.o.  Body mass index is 31.56 kg/(m^2).  Patient Active Problem List   Diagnosis Date Noted  . Perirectal abscess 09/15/2012  . HYPOGONADISM 06/18/2009  . DEPRESSION 02/01/2008  . URI 08/28/2007  . DIFFUSE IDIOPATHIC SKELETAL HYPEROSTOSIS 06/16/2007  . COLONIC POLYPS, HX OF 06/16/2007  . HYPERTENSION 01/16/2007    No Known Allergies  History reviewed. No pertinent past surgical history. [name], MD No diagnosis found.  Followup visit after excision and debridement of recurrent peri-rectal abscess. The area that I had excised and debrided has healed over. There is a nontender area of induration there which would be at the 11:00 position if he were in the dorsal lithotomy position. This is anterior and on the right side. There is no tenderness, no redness, no evidence of infection. I will see him again around July 13 which is one month prior to his scheduled hip replacement on August 13. [name], MD, [facility] [phone] beeper [phone]  09/28/2012 9:17 AM

## 2012-10-10 DIAGNOSIS — J209 Acute bronchitis, unspecified: Secondary | ICD-10-CM | POA: Diagnosis not present

## 2012-10-17 DIAGNOSIS — J40 Bronchitis, not specified as acute or chronic: Secondary | ICD-10-CM

## 2012-10-17 HISTORY — DX: Bronchitis, not specified as acute or chronic: J40

## 2012-10-26 DIAGNOSIS — I639 Cerebral infarction, unspecified: Secondary | ICD-10-CM

## 2012-10-26 DIAGNOSIS — G453 Amaurosis fugax: Secondary | ICD-10-CM

## 2012-10-26 DIAGNOSIS — H538 Other visual disturbances: Secondary | ICD-10-CM | POA: Diagnosis not present

## 2012-10-26 DIAGNOSIS — H40019 Open angle with borderline findings, low risk, unspecified eye: Secondary | ICD-10-CM | POA: Diagnosis not present

## 2012-10-26 DIAGNOSIS — I10 Essential (primary) hypertension: Secondary | ICD-10-CM | POA: Diagnosis not present

## 2012-10-26 DIAGNOSIS — H53129 Transient visual loss, unspecified eye: Secondary | ICD-10-CM | POA: Diagnosis not present

## 2012-10-26 HISTORY — DX: Amaurosis fugax: G45.3

## 2012-10-26 HISTORY — DX: Cerebral infarction, unspecified: I63.9

## 2012-10-31 ENCOUNTER — Encounter: Payer: Self-pay | Admitting: Internal Medicine

## 2012-10-31 ENCOUNTER — Ambulatory Visit (INDEPENDENT_AMBULATORY_CARE_PROVIDER_SITE_OTHER): Payer: Medicare Other | Admitting: Internal Medicine

## 2012-10-31 VITALS — BP 126/82 | HR 81 | Temp 98.2°F | Resp 20 | Wt 213.0 lb

## 2012-10-31 DIAGNOSIS — I10 Essential (primary) hypertension: Secondary | ICD-10-CM

## 2012-10-31 DIAGNOSIS — H3400 Transient retinal artery occlusion, unspecified eye: Secondary | ICD-10-CM

## 2012-10-31 DIAGNOSIS — G453 Amaurosis fugax: Secondary | ICD-10-CM

## 2012-10-31 LAB — SEDIMENTATION RATE: Sed Rate: 19 mm/hr (ref 0–22)

## 2012-10-31 LAB — CBC WITH DIFFERENTIAL/PLATELET
Eosinophils Relative: 1.1 % (ref 0.0–5.0)
MCV: 94.9 fl (ref 78.0–100.0)
Monocytes Absolute: 0.5 10*3/uL (ref 0.1–1.0)
Neutrophils Relative %: 57 % (ref 43.0–77.0)
Platelets: 250 10*3/uL (ref 150.0–400.0)
WBC: 6 10*3/uL (ref 4.5–10.5)

## 2012-10-31 NOTE — Patient Instructions (Addendum)
Limit your sodium (Salt) intake  Please check your blood pressure on a regular basis.  If it is consistently greater than 150/90, please make an office appointment.  Call or return to clinic prn if these symptoms worsen or fail to improve as anticipated. Amaurosis Fugax Amaurosis fugax is a condition in which a person loses sight in one eye. The loss of vision in the affected eye may be total or partial. It usually lasts just a few seconds or minutes. Then, it returns to normal. Occasionally, it may last for several hours. This is caused by interruption of blood flow to the artery that supplies blood to the retina (lining at the back of the eye, contains nerves needed for sight). The temporary loss of blood flow causes symptoms similar to a stroke. The family of symptoms that happen from a loss of blood flow is called a Transient Ischemic Attack (TIA, mini-stroke). In the case of amaurosis fugax, the eye is the organ that is involved. SYMPTOMS   Painless, sudden loss of vision in one eye.  Visual loss is often from the top down, appearing like a curtain being pulled down over the field of vision.  Rapid return of vision. Vision generally comes back in a few minutes to several hours. CAUSES  TIAs and amaurosis fugax are caused by a loss of blood flow. This can be due to a buildup of cholesterol and fats (plaque) in the arteries or the heart. If some of that plaque comes off the artery and gets into the bloodstream, it can flow to the artery that supplies blood to the retina, blocking the flow of blood to the retina. When that happens, vision is lost for as long as the blood flow is interrupted. Factors that make it more likely you will have amaurosis fugax at some point include:  Smoking.  Poorly controlled diabetes.  High blood pressure.  High cholesterol levels. Medical conditions that may increase the risk of an attack of amaurosis fugax include:  Heart disease.  Diseases of the heart  valves.  Certain diseases of the blood (sickle cell anemia, leukemia).  Blood clotting (coagulation) disorders.  Artery inflammation (temporal arteritis, giant cell arteritis). Since amaurosis fugax is an "incomplete stroke," in some people it can be a sign of an increased risk for an actual stroke. A stroke can result in permanent vision loss or loss of other body functions. As a result, caring for yourself after amaurosis fugax means taking many of the same steps you should take to prevent a stroke. HOME CARE INSTRUCTIONS   Only take over-the-counter or prescription medicines for pain, discomfort, or fever as directed by your caregiver.  Take any medicines that are prescribed for control of your blood pressure and cholesterol levels.  Keep diabetes under control as well as possible.  Stop smoking.  Follow diet instructions, if your caregiver has given them to you.  Try to get at least 30 minutes of moderate physical activity every day. If you have not been active, talk to your caregiver about how to get started. SEEK IMMEDIATE MEDICAL CARE IF:   You lose vision in one or both eyes again, even if only for a short period of time.  You lose vision in one eye and it does not recover within a very brief time (less than 5-10 minutes). The sooner you see an eye specialist (ophthalmologist), the better the chance of regaining some vision, in the case of a central retinal artery occlusion (CRAO, blockage of central  retinal artery). However, most cases of CRAO result in some degree of permanent visual loss, even with aggressive treatment.  You have symptoms of a stroke:  Weakness in one side of your body.  Difficulty speaking or thinking clearly.  Lack of coordination. Document Released: 01/13/2008 Document Revised: 06/28/2011 Document Reviewed: 01/13/2008 [employer] Patient Information 2014 [place], [place].

## 2012-10-31 NOTE — Progress Notes (Signed)
Subjective:    Patient ID: Luis Fisher, male    DOB: [birth year], 57 y.o.   MRN: [id number]  HPI   57 year old patient who was visiting in [place] on July 10. He had a transient episode of visual loss ; it affected his left visual field. Symptoms were abrupt and slow resolved over 60 minutes. He was seen by a local ophthalmologist with a normal exam and  clinical suspicion of amaurosis fugax. He has been on daily aspirin. He does have a history of controlled hypertension. He's had no recurrent symptoms.  Past Medical History  Diagnosis Date  . Hypertension   . Depression   . DISH (diffuse idiopathic skeletal hyperostosis)   . Hx of colonic polyps   . Arthritis     History   Social History  . Marital Status: Married    Spouse Name: N/A    Number of Children: N/A  . Years of Education: N/A   Occupational History  . Not on file.   Social History Main Topics  . Smoking status: Former Smoker    Types: Cigars    Quit date: 04/19/1993  . Smokeless tobacco: Never Used  . Alcohol Use: 1.8 oz/week    3 Glasses of wine per week     Comment: Daily.  . Drug Use: No  . Sexually Active: Not on file   Other Topics Concern  . Not on file   Social History Narrative  . No narrative on file    History reviewed. No pertinent past surgical history.  Family History  Problem Relation Age of Onset  . Hypothyroidism Mother   . Hodgkin's lymphoma Sister   . Hypothyroidism Sister   . Hypertension Sister   . Hypertension Brother     No Known Allergies  Current Outpatient Prescriptions on File Prior to Visit  Medication Sig Dispense Refill  . amLODipine (NORVASC) 10 MG tablet Take 1 tablet (10 mg total) by mouth daily.  90 tablet  6  . aspirin 325 MG tablet Take 325 mg by mouth daily.      . clobetasol cream (TEMOVATE) 0.05 % Apply 1 application topically 2 (two) times daily.  60 g  3  . diclofenac sodium (VOLTAREN) 1 % GEL Apply 1 application topically 2 (two) times daily as  needed.  100 Tube  6  . furosemide (LASIX) 40 MG tablet Take 1 tablet (40 mg total) by mouth daily as needed.  90 tablet  6  . ibuprofen (ADVIL,MOTRIN) 800 MG tablet Take 1 tablet (800 mg total) by mouth 3 (three) times daily as needed.  270 tablet  6  . metoprolol (LOPRESSOR) 100 MG tablet 1 and 1/2 tablet twice daily  180 tablet  6  . traMADol (ULTRAM) 50 MG tablet Take 1 tablet (50 mg total) by mouth as needed.  90 tablet  6  . HYDROcodone-acetaminophen (LORTAB) 7.5-500 MG per tablet Take 2 tablets by mouth every 6 (six) hours as needed for pain.  60 tablet  3  . Testosterone (ANDROGEL PUMP) 20.25 MG/ACT (1.62%) GEL Place 4 application onto the skin every morning.  150 g  5  . zolpidem (AMBIEN) 10 MG tablet Take 1 tablet (10 mg total) by mouth at bedtime as needed.  30 tablet  2   No current facility-administered medications on file prior to visit.    BP 126/82  Pulse 81  Temp(Src) 98.2 F (36.8 C) (Oral)  Resp 20  Wt 213 lb (96.616 kg)  BMI 31.44 kg/m2  SpO2 97%       Review of Systems  Constitutional: Negative for fever, chills, appetite change and fatigue.  HENT: Negative for hearing loss, ear pain, congestion, sore throat, trouble swallowing, neck stiffness, dental problem, voice change and tinnitus.   Eyes: Positive for visual disturbance. Negative for pain and discharge.  Respiratory: Negative for cough, chest tightness, wheezing and stridor.   Cardiovascular: Negative for chest pain, palpitations and leg swelling.  Gastrointestinal: Negative for nausea, vomiting, abdominal pain, diarrhea, constipation, blood in stool and abdominal distention.  Genitourinary: Negative for urgency, hematuria, flank pain, discharge, difficulty urinating and genital sores.  Musculoskeletal: Negative for myalgias, back pain, joint swelling, arthralgias and gait problem.  Skin: Negative for rash.  Neurological: Negative for dizziness, syncope, speech difficulty, weakness, numbness and  headaches.  Hematological: Negative for adenopathy. Does not bruise/bleed easily.  Psychiatric/Behavioral: Negative for behavioral problems and dysphoric mood. The patient is not nervous/anxious.        Objective:   Physical Exam  Constitutional: He is oriented to person, place, and time. He appears well-developed and well-nourished. No distress.  HENT:  Head: Normocephalic.  Right Ear: External ear normal.  Left Ear: External ear normal.  Eyes: Conjunctivae and EOM are normal.  Neck: Normal range of motion.  No bruits  Cardiovascular: Normal rate and normal heart sounds.   Pulmonary/Chest: Breath sounds normal.  Abdominal: Bowel sounds are normal.  Musculoskeletal: Normal range of motion. He exhibits no edema and no tenderness.  Neurological: He is alert and oriented to person, place, and time.  Psychiatric: He has a normal mood and affect. His behavior is normal.          Assessment & Plan:   Probable amaurosis fugax. We'll proceed with a carotid artery Doppler study as well as checking a set rate. We'll continue daily aspirin and maintain good blood pressure control Hypertension well controlled

## 2012-11-01 ENCOUNTER — Encounter (INDEPENDENT_AMBULATORY_CARE_PROVIDER_SITE_OTHER): Payer: Self-pay | Admitting: Surgery

## 2012-11-01 ENCOUNTER — Ambulatory Visit (INDEPENDENT_AMBULATORY_CARE_PROVIDER_SITE_OTHER): Payer: Medicare Other | Admitting: Surgery

## 2012-11-01 VITALS — BP 140/84 | HR 74 | Resp 14 | Ht 69.0 in | Wt 211.6 lb

## 2012-11-01 DIAGNOSIS — K611 Rectal abscess: Secondary | ICD-10-CM

## 2012-11-01 DIAGNOSIS — K612 Anorectal abscess: Secondary | ICD-10-CM | POA: Diagnosis not present

## 2012-11-01 NOTE — Patient Instructions (Signed)
Thanks for your patience.  If you need further assistance after leaving the office, please call our office and speak with a [facility] nurse.  [phone].  If you want to leave a message for Dr. Usman Millett, please call his office phone at [phone]. 

## 2012-11-01 NOTE — Progress Notes (Signed)
Chief Complaint:  Festering chronic perianal nodule  History of Present Illness:  Luis Fisher is an 57 y.o. male who is scheduled to have a total joint replacement August 13 by Dr. Julien Nordmann for a diffuse hyperostosis condition and these had. This nodule recently recessed her and opened it in the office and got out about a cc of purulence and there is some nontender induration beneath it. He has had this since he had a bout of bad diarrhea 2 years ago but denies recurrent or ongoing diarrhea such as in Crohn's disease. I think it best that we try to excise this he needed to close before he has his hip surgery.  Past Medical History  Diagnosis Date  . Hypertension   . Depression   . DISH (diffuse idiopathic skeletal hyperostosis)   . Hx of colonic polyps   . Arthritis     History reviewed. No pertinent past surgical history.  Current Outpatient Prescriptions  Medication Sig Dispense Refill  . amLODipine (NORVASC) 10 MG tablet Take 1 tablet (10 mg total) by mouth daily.  90 tablet  6  . aspirin 325 MG tablet Take 325 mg by mouth daily.      . clobetasol cream (TEMOVATE) 0.05 % Apply 1 application topically 2 (two) times daily.  60 g  3  . diclofenac sodium (VOLTAREN) 1 % GEL Apply 1 application topically 2 (two) times daily as needed.  100 Tube  6  . furosemide (LASIX) 40 MG tablet Take 1 tablet (40 mg total) by mouth daily as needed.  90 tablet  6  . HYDROcodone-acetaminophen (LORTAB) 7.5-500 MG per tablet Take 2 tablets by mouth every 6 (six) hours as needed for pain.  60 tablet  3  . ibuprofen (ADVIL,MOTRIN) 800 MG tablet Take 1 tablet (800 mg total) by mouth 3 (three) times daily as needed.  270 tablet  6  . metoprolol (LOPRESSOR) 100 MG tablet 1 and 1/2 tablet twice daily  180 tablet  6  . Testosterone (ANDROGEL PUMP) 20.25 MG/ACT (1.62%) GEL Place 4 application onto the skin every morning.  150 g  5  . traMADol (ULTRAM) 50 MG tablet Take 1 tablet (50 mg total) by mouth as needed.  90  tablet  6  . VENTOLIN HFA 108 (90 BASE) MCG/ACT inhaler       . zolpidem (AMBIEN) 10 MG tablet Take 1 tablet (10 mg total) by mouth at bedtime as needed.  30 tablet  2   No current facility-administered medications for this visit.   Review of patient's allergies indicates no known allergies. Family History  Problem Relation Age of Onset  . Hypothyroidism Mother   . Hodgkin's lymphoma Sister   . Hypothyroidism Sister   . Hypertension Sister   . Hypertension Brother    Social History:   reports that he quit smoking about 19 years ago. His smoking use included Cigars. He has never used smokeless tobacco. He reports that he drinks about 1.8 ounces of alcohol per week. He reports that he does not use illicit drugs.   REVIEW OF SYSTEMS - PERTINENT POSITIVES ONLY: See old chart  Physical Exam:   Blood pressure 140/84, pulse 74, resp. rate 14, height 5\' 9"  (1.753 m), weight 211 lb 9.6 oz (95.981 kg). Body mass index is 31.23 kg/(m^2).  Gen:  WDWN white male NAD  Neurological: Alert and oriented to person, place, and time. Motor and sensory function is grossly intact  Head: Normocephalic and atraumatic.  Eyes: Conjunctivae are  normal. Pupils are equal, round, and reactive to light. No scleral icterus.  Cardiovascular:  SR without murmurs or gallops.  No carotid bruits GU: anterior nodule near the anus;  Induration but no erythema or tenderness Musculoskeletal: Normal range of motion. Extremities are nontender. No cyanosis, edema or clubbing noted Lymphadenopathy: No cervical, preauricular, postauricular or axillary adenopathy is present Skin: Skin is warm and dry. No rash noted. No diaphoresis. No erythema. No pallor. Pscyh: Normal mood and affect. Behavior is normal. Judgment and thought content normal.   LABORATORY RESULTS: Results for orders placed in visit on 10/31/12 (from the past 48 hour(s))  SEDIMENTATION RATE     Status: None   Collection Time    10/31/12  9:12 AM      Result  Value Range   Sed Rate 19  0 - 22 mm/hr  CBC WITH DIFFERENTIAL     Status: None   Collection Time    10/31/12  9:12 AM      Result Value Range   WBC 6.0  4.5 - 10.5 K/uL   RBC 4.80  4.22 - 5.81 Mil/uL   Hemoglobin 15.6  13.0 - 17.0 g/dL   HCT 16.1  09.6 - 04.5 %   MCV 94.9  78.0 - 100.0 fl   MCHC 34.3  30.0 - 36.0 g/dL   RDW 40.9  81.1 - 91.4 %   Platelets 250.0  150.0 - 400.0 K/uL   Neutrophils Relative % 57.0  43.0 - 77.0 %   Lymphocytes Relative 33.2  12.0 - 46.0 %   Monocytes Relative 8.1  3.0 - 12.0 %   Eosinophils Relative 1.1  0.0 - 5.0 %   Basophils Relative 0.6  0.0 - 3.0 %   Neutro Abs 3.4  1.4 - 7.7 K/uL   Lymphs Abs 2.0  0.7 - 4.0 K/uL   Monocytes Absolute 0.5  0.1 - 1.0 K/uL   Eosinophils Absolute 0.1  0.0 - 0.7 K/uL   Basophils Absolute 0.0  0.0 - 0.1 K/uL    RADIOLOGY RESULTS: No results found.  Problem List: Patient Active Problem List   Diagnosis Date Noted  . Perirectal abscess 09/15/2012  . HYPOGONADISM 06/18/2009  . DEPRESSION 02/01/2008  . URI 08/28/2007  . DIFFUSE IDIOPATHIC SKELETAL HYPEROSTOSIS 06/16/2007  . COLONIC POLYPS, HX OF 06/16/2007  . HYPERTENSION 01/16/2007    Assessment & Plan: Nodule in the perianal region that needs to be excised and closed before hip surgery    [name], MD, [facility] [phone] beeper [phone]  11/01/2012 2:52 PM

## 2012-11-02 ENCOUNTER — Encounter (HOSPITAL_COMMUNITY): Payer: Self-pay | Admitting: Pharmacy Technician

## 2012-11-06 ENCOUNTER — Encounter (HOSPITAL_COMMUNITY)
Admission: RE | Admit: 2012-11-06 | Discharge: 2012-11-06 | Disposition: A | Discharge status: Home / Self Care | Payer: Medicare Other | Source: Ambulatory Visit | Attending: Surgery | Admitting: Surgery

## 2012-11-06 ENCOUNTER — Encounter (HOSPITAL_COMMUNITY): Payer: Self-pay

## 2012-11-06 ENCOUNTER — Ambulatory Visit (HOSPITAL_COMMUNITY)
Admission: RE | Admit: 2012-11-06 | Discharge: 2012-11-06 | Disposition: A | Discharge status: Home / Self Care | Payer: Medicare Other | Source: Ambulatory Visit | Attending: Surgery | Admitting: Surgery

## 2012-11-06 DIAGNOSIS — R05 Cough: Secondary | ICD-10-CM | POA: Insufficient documentation

## 2012-11-06 DIAGNOSIS — R059 Cough, unspecified: Secondary | ICD-10-CM | POA: Insufficient documentation

## 2012-11-06 DIAGNOSIS — Z5309 Procedure and treatment not carried out because of other contraindication: Secondary | ICD-10-CM | POA: Diagnosis not present

## 2012-11-06 DIAGNOSIS — Z01812 Encounter for preprocedural laboratory examination: Secondary | ICD-10-CM | POA: Insufficient documentation

## 2012-11-06 DIAGNOSIS — Z01810 Encounter for preprocedural cardiovascular examination: Secondary | ICD-10-CM | POA: Insufficient documentation

## 2012-11-06 DIAGNOSIS — Z01818 Encounter for other preprocedural examination: Secondary | ICD-10-CM | POA: Insufficient documentation

## 2012-11-06 DIAGNOSIS — Z79899 Other long term (current) drug therapy: Secondary | ICD-10-CM | POA: Diagnosis not present

## 2012-11-06 DIAGNOSIS — K6289 Other specified diseases of anus and rectum: Secondary | ICD-10-CM | POA: Diagnosis not present

## 2012-11-06 DIAGNOSIS — Z7982 Long term (current) use of aspirin: Secondary | ICD-10-CM | POA: Diagnosis not present

## 2012-11-06 DIAGNOSIS — I10 Essential (primary) hypertension: Secondary | ICD-10-CM | POA: Diagnosis not present

## 2012-11-06 HISTORY — DX: Amaurosis fugax: G45.3

## 2012-11-06 HISTORY — DX: Cerebral infarction, unspecified: I63.9

## 2012-11-06 HISTORY — DX: Unspecified asthma, uncomplicated: J45.909

## 2012-11-06 HISTORY — DX: Adverse effect of unspecified anesthetic, initial encounter: T41.45XA

## 2012-11-06 HISTORY — DX: Other complications of anesthesia, initial encounter: T88.59XA

## 2012-11-06 HISTORY — DX: Unspecified injury of head, initial encounter: S09.90XA

## 2012-11-06 HISTORY — DX: Herpesviral infection, unspecified: B00.9

## 2012-11-06 HISTORY — DX: Pneumonia, unspecified organism: J18.9

## 2012-11-06 HISTORY — DX: Bronchitis, not specified as acute or chronic: J40

## 2012-11-06 HISTORY — DX: Headache: R51

## 2012-11-06 LAB — CBC
HCT: 43.3 % (ref 39.0–52.0)
Hemoglobin: 15.5 g/dL (ref 13.0–17.0)
MCHC: 35.8 g/dL (ref 30.0–36.0)
MCV: 90.2 fL (ref 78.0–100.0)
RDW: 12.1 % (ref 11.5–15.5)

## 2012-11-06 LAB — BASIC METABOLIC PANEL
BUN: 13 mg/dL (ref 6–23)
Chloride: 97 mEq/L (ref 96–112)
Creatinine, Ser: 0.87 mg/dL (ref 0.50–1.35)
GFR calc Af Amer: >90 mL/min (ref >90)
GFR calc non Af Amer: >90 mL/min (ref >90)
Glucose, Bld: 105 mg/dL — ABNORMAL HIGH (ref 70–99)
Potassium: 3.8 mEq/L (ref 3.5–5.1)

## 2012-11-06 NOTE — Patient Instructions (Addendum)
20 Shinichi Anguiano  11/06/2012   Your procedure is scheduled on: 11/07/12  Report to [facility] at 5:15 AM.  Call this number if you have problems the morning of surgery [phone]: [phone]   Remember:   Do not eat food or drink liquids After Midnight.     Take these medicines the morning of surgery with A SIP OF WATER: amlodipine, metoprolol   Do not wear jewelry, make-up or nail polish.  Do not wear lotions, powders, or perfumes. You may wear deodorant.  Do not shave 48 hours prior to surgery. Men may shave face and neck.  Do not bring valuables to the hospital.  Contacts, dentures or bridgework may not be worn into surgery.   Patients discharged the day of surgery will not be allowed to drive home.  Name and phone number of your driver: Judeth Cornfield [phone]    Birdie Sons, RN  pre op nurse call if needed [phone]    FAILURE TO FOLLOW THESE INSTRUCTIONS MAY RESULT IN CANCELLATION OF YOUR SURGERY   Patient Signature: ___________________________________________

## 2012-11-06 NOTE — Progress Notes (Signed)
11/06/12 1430  OBSTRUCTIVE SLEEP APNEA  Have you ever been diagnosed with sleep apnea through a sleep study? No  Do you snore loudly (loud enough to be heard through closed doors)?  1  Do you often feel tired, fatigued, or sleepy during the daytime? 0  Has anyone observed you stop breathing during your sleep? 0  Do you have, or are you being treated for high blood pressure? 1  BMI more than 35 kg/m2? 0  Age over 59 years old? 1  Neck circumference greater than 40 cm/18 inches? 0  Gender: 1  Obstructive Sleep Apnea Score 4  Score 4 or greater  Results sent to PCP

## 2012-11-07 ENCOUNTER — Ambulatory Visit (HOSPITAL_COMMUNITY): Payer: Medicare Other | Admitting: Anesthesiology

## 2012-11-07 ENCOUNTER — Encounter (HOSPITAL_COMMUNITY)
Admission: RE | Disposition: A | Discharge status: Home / Self Care | Payer: Self-pay | Source: Ambulatory Visit | Attending: Surgery

## 2012-11-07 ENCOUNTER — Encounter (HOSPITAL_COMMUNITY): Payer: Self-pay | Admitting: *Deleted

## 2012-11-07 ENCOUNTER — Encounter (HOSPITAL_COMMUNITY): Payer: Self-pay | Admitting: Anesthesiology

## 2012-11-07 ENCOUNTER — Ambulatory Visit (HOSPITAL_COMMUNITY)
Admission: RE | Admit: 2012-11-07 | Discharge: 2012-11-07 | Disposition: A | Discharge status: Home / Self Care | Payer: Medicare Other | Source: Ambulatory Visit | Attending: Surgery | Admitting: Surgery

## 2012-11-07 DIAGNOSIS — Z79899 Other long term (current) drug therapy: Secondary | ICD-10-CM | POA: Insufficient documentation

## 2012-11-07 DIAGNOSIS — Z5309 Procedure and treatment not carried out because of other contraindication: Secondary | ICD-10-CM | POA: Insufficient documentation

## 2012-11-07 DIAGNOSIS — I10 Essential (primary) hypertension: Secondary | ICD-10-CM | POA: Insufficient documentation

## 2012-11-07 DIAGNOSIS — Z7982 Long term (current) use of aspirin: Secondary | ICD-10-CM | POA: Insufficient documentation

## 2012-11-07 DIAGNOSIS — K6289 Other specified diseases of anus and rectum: Secondary | ICD-10-CM | POA: Insufficient documentation

## 2012-11-07 SURGERY — INCISION AND DRAINAGE, ABSCESS, PERIRECTAL
Anesthesia: General

## 2012-11-07 MED ORDER — LACTATED RINGERS IV SOLN
INTRAVENOUS | Status: DC | PRN
  Administered 2012-11-07 – 2013-01-01 (×4): via INTRAVENOUS

## 2012-11-07 MED ORDER — CEFOXITIN SODIUM-DEXTROSE 1-4 GM-% IV SOLR (PREMIX)
INTRAVENOUS | Status: AC
  Filled 2012-11-07: qty 100

## 2012-11-07 MED ORDER — HEPARIN SODIUM (PORCINE) 5000 UNIT/ML IJ SOLN
5000.0000 [IU] | Freq: Once | INTRAMUSCULAR | Status: AC
  Administered 2012-11-07: 5000 [IU] via SUBCUTANEOUS
  Filled 2012-11-07: qty 1

## 2012-11-07 MED ORDER — DEXTROSE 5 % IV SOLN: 2.0000 g | INTRAVENOUS | Status: DC

## 2012-11-07 NOTE — Progress Notes (Signed)
Surgery cancelled due to recent episode of visual disturbance, pending carotid workup.  IV discontinued and patient transferred back to Short Stay for discharge home.

## 2012-11-07 NOTE — Anesthesia Preprocedure Evaluation (Addendum)
Anesthesia Evaluation  Patient identified by MRN, date of birth, ID band Patient awake    Reviewed: Allergy & Precautions, H&P , NPO status , Patient's Chart, lab work & pertinent test results  Airway Mallampati: II TM Distance: >3 FB Neck ROM: Limited    Dental no notable dental hx.    Pulmonary asthma , pneumonia -, resolved,  breath sounds clear to auscultation  Pulmonary exam normal       Cardiovascular Exercise Tolerance: Good hypertension, Pt. on medications and Pt. on home beta blockers negative cardio ROS  Rhythm:Regular Rate:Normal     Neuro/Psych  Headaches, PSYCHIATRIC DISORDERS Depression CVA    GI/Hepatic negative GI ROS, Neg liver ROS,   Endo/Other  negative endocrine ROS  Renal/GU negative Renal ROS  negative genitourinary   Musculoskeletal negative musculoskeletal ROS (+)   Abdominal (+) + obese,   Peds negative pediatric ROS (+)  Hematology negative hematology ROS (+)   Anesthesia Other Findings   Reproductive/Obstetrics negative OB ROS                           Anesthesia Physical Anesthesia Plan  ASA: III  Anesthesia Plan: General   Post-op Pain Management:    Induction: Intravenous  Airway Management Planned: LMA  Additional Equipment:   Intra-op Plan:   Post-operative Plan: Extubation in OR  Informed Consent: I have reviewed the patients History and Physical, chart, labs and discussed the procedure including the risks, benefits and alternatives for the proposed anesthesia with the patient or authorized representative who has indicated his/her understanding and acceptance.   Dental advisory given  Plan Discussed with: CRNA  Anesthesia Plan Comments: (Difficult airway ?  Mr. Birchard had an episode of amaurosis fugax on 10-26-12. He is scheduled for a carotid ultrasound this coming Friday. I believe it is prudent to see the results of this ultrasound before  this elective surgery. Mr. Brod and Dr. Daphine Deutscher agree. Patient to be rescheduled.)       Anesthesia Quick Evaluation

## 2012-11-07 NOTE — Progress Notes (Signed)
Surgery cancelled in Holding Area. Returned to room to dress, pt understands to call office to reschedule

## 2012-11-08 ENCOUNTER — Telehealth (INDEPENDENT_AMBULATORY_CARE_PROVIDER_SITE_OTHER): Payer: Self-pay | Admitting: General Surgery

## 2012-11-08 NOTE — Telephone Encounter (Signed)
Spoke with pt and was informed his sx was cancelled because of his amarosis fugax.  I explained that I would put this information in his chart for future reference.

## 2012-11-10 ENCOUNTER — Encounter (INDEPENDENT_AMBULATORY_CARE_PROVIDER_SITE_OTHER): Payer: Medicare Other

## 2012-11-10 DIAGNOSIS — H3400 Transient retinal artery occlusion, unspecified eye: Secondary | ICD-10-CM

## 2012-11-10 DIAGNOSIS — I6529 Occlusion and stenosis of unspecified carotid artery: Secondary | ICD-10-CM | POA: Diagnosis not present

## 2012-11-10 DIAGNOSIS — G453 Amaurosis fugax: Secondary | ICD-10-CM

## 2012-11-14 ENCOUNTER — Other Ambulatory Visit: Payer: Self-pay | Admitting: Orthopedic Surgery

## 2012-11-14 DIAGNOSIS — M169 Osteoarthritis of hip, unspecified: Secondary | ICD-10-CM | POA: Diagnosis not present

## 2012-11-14 DIAGNOSIS — M4810 Ankylosing hyperostosis [Forestier], site unspecified: Secondary | ICD-10-CM | POA: Diagnosis not present

## 2012-11-14 DIAGNOSIS — M1610 Unilateral primary osteoarthritis, unspecified hip: Secondary | ICD-10-CM | POA: Diagnosis not present

## 2012-11-14 NOTE — Progress Notes (Signed)
Surgery scheduled for 11/29/12.  Need orders in EPIC.  Thank You.

## 2012-11-14 NOTE — Progress Notes (Signed)
Preoperative surgical orders have been place into the Epic hospital system for Luis Fisher on 11/14/2012, 5:33 PM  by Luis Fisher for surgery on 11/29/2012.  Preop Total Hip orders including Experel Injecion, PO Tylenol, and IV Decadron as long as there are no contraindications to the above medications. Avel Peace, PA-C

## 2012-11-17 ENCOUNTER — Encounter (HOSPITAL_COMMUNITY): Payer: Self-pay | Admitting: Pharmacy Technician

## 2012-11-20 ENCOUNTER — Encounter (HOSPITAL_COMMUNITY): Payer: Self-pay | Admitting: *Deleted

## 2012-11-20 NOTE — Progress Notes (Signed)
PT CALLED - HE HAS BEEN ADDED ON FOR SURGERY Monday 11/27/12 AND STATES HE WAS POSTED FOR THIS SAME SURGERY July 22 - WAS CANCELLED IN THE OR AFTER ANESTHESIOLOGIST REVIEWED HIS MEDICAL HX AND AWARE PT SCHEDULED TO HAVE CAROTID STUDY AS FOLLOW UP OF HIS RECENT EPISODE OF PARTIAL VISION LOSS.  PT STATES CAROTID STUDY WAS DONE - HE WAS TOLD MODERATE PLAQUE - REPEAT STUDY IN ONE YEAR AND OK TO HAVE HIS SURGERY. PREOP INSTRUCTIONS REVIEWED WITH PT INCLUDING CHLORHEXIDINE SHOWERS / PRECAUTIONS. CAROTID DOPPLER STUDY WITH COMMENT BY DR. Amador Cunas IN EPIC. NO ORDERS IN EPIC YET FROM DR. MARTIN - PT STATES HE HAD TO DO FLEET ENEMA LAST TIME -WILL PLAN TO DO ENEMA AGAIN.

## 2012-11-20 NOTE — Progress Notes (Signed)
Dr. Daphine Deutscher,         Please enter preop orders in epic for Luis Fisher's surgery - scheduled for Monday 8/11.   Thanks

## 2012-11-21 ENCOUNTER — Encounter (HOSPITAL_COMMUNITY): Payer: Self-pay | Admitting: *Deleted

## 2012-11-22 ENCOUNTER — Encounter (INDEPENDENT_AMBULATORY_CARE_PROVIDER_SITE_OTHER): Payer: Medicare Other | Admitting: Surgery

## 2012-11-23 ENCOUNTER — Other Ambulatory Visit (INDEPENDENT_AMBULATORY_CARE_PROVIDER_SITE_OTHER): Payer: Self-pay | Admitting: Surgery

## 2012-11-24 ENCOUNTER — Inpatient Hospital Stay (HOSPITAL_COMMUNITY): Admission: RE | Admit: 2012-11-24 | Payer: Medicare Other | Source: Ambulatory Visit

## 2012-11-27 ENCOUNTER — Encounter (HOSPITAL_COMMUNITY)
Admission: RE | Disposition: A | Discharge status: Home / Self Care | Payer: Self-pay | Source: Ambulatory Visit | Attending: Surgery

## 2012-11-27 ENCOUNTER — Encounter (HOSPITAL_COMMUNITY): Payer: Self-pay | Admitting: Anesthesiology

## 2012-11-27 ENCOUNTER — Ambulatory Visit (HOSPITAL_COMMUNITY)
Admission: RE | Admit: 2012-11-27 | Discharge: 2012-11-27 | Disposition: A | Discharge status: Home / Self Care | Payer: Medicare Other | Source: Ambulatory Visit | Attending: Surgery | Admitting: Surgery

## 2012-11-27 ENCOUNTER — Ambulatory Visit (HOSPITAL_COMMUNITY): Payer: Medicare Other | Admitting: Anesthesiology

## 2012-11-27 ENCOUNTER — Encounter (HOSPITAL_COMMUNITY): Payer: Self-pay | Admitting: *Deleted

## 2012-11-27 DIAGNOSIS — K603 Anal fistula, unspecified: Secondary | ICD-10-CM | POA: Insufficient documentation

## 2012-11-27 DIAGNOSIS — Z79899 Other long term (current) drug therapy: Secondary | ICD-10-CM | POA: Diagnosis not present

## 2012-11-27 DIAGNOSIS — L02219 Cutaneous abscess of trunk, unspecified: Secondary | ICD-10-CM

## 2012-11-27 DIAGNOSIS — M948X9 Other specified disorders of cartilage, unspecified sites: Secondary | ICD-10-CM | POA: Insufficient documentation

## 2012-11-27 DIAGNOSIS — K6289 Other specified diseases of anus and rectum: Secondary | ICD-10-CM | POA: Diagnosis not present

## 2012-11-27 DIAGNOSIS — E669 Obesity, unspecified: Secondary | ICD-10-CM | POA: Insufficient documentation

## 2012-11-27 DIAGNOSIS — I10 Essential (primary) hypertension: Secondary | ICD-10-CM | POA: Diagnosis not present

## 2012-11-27 DIAGNOSIS — L03319 Cellulitis of trunk, unspecified: Secondary | ICD-10-CM | POA: Diagnosis not present

## 2012-11-27 HISTORY — PX: INCISION AND DRAINAGE PERIRECTAL ABSCESS: SHX1804

## 2012-11-27 HISTORY — DX: Tinnitus, right ear: H93.11

## 2012-11-27 HISTORY — DX: Unspecified injury of unspecified foot, initial encounter: S99.929A

## 2012-11-27 HISTORY — DX: Other specified diseases of anus and rectum: K62.89

## 2012-11-27 SURGERY — INCISION AND DRAINAGE, ABSCESS, PERIRECTAL
Anesthesia: General | Site: Buttocks | Wound class: Clean Contaminated

## 2012-11-27 MED ORDER — FENTANYL CITRATE 0.05 MG/ML IJ SOLN
INTRAMUSCULAR | Status: DC | PRN
  Administered 2012-11-27: 25 ug via INTRAVENOUS
  Administered 2012-11-27 (×2): 50 ug via INTRAVENOUS

## 2012-11-27 MED ORDER — LACTATED RINGERS IV SOLN
INTRAVENOUS | Status: DC
  Administered 2012-11-27: 1000 mL via INTRAVENOUS

## 2012-11-27 MED ORDER — MIDAZOLAM HCL 5 MG/5ML IJ SOLN
INTRAMUSCULAR | Status: DC | PRN
  Administered 2012-11-27: 2 mg via INTRAVENOUS

## 2012-11-27 MED ORDER — CEFOXITIN SODIUM 2 G IV SOLR
2.0000 g | INTRAVENOUS | Status: AC
  Administered 2012-11-27: 2 g via INTRAVENOUS
  Filled 2012-11-27: qty 2

## 2012-11-27 MED ORDER — HEPARIN SODIUM (PORCINE) 5000 UNIT/ML IJ SOLN
5000.0000 [IU] | Freq: Once | INTRAMUSCULAR | Status: AC
  Administered 2012-11-27: 5000 [IU] via SUBCUTANEOUS
  Filled 2012-11-27: qty 1

## 2012-11-27 MED ORDER — BUPIVACAINE-EPINEPHRINE 0.25% -1:200000 IJ SOLN
INTRAMUSCULAR | Status: DC | PRN
  Administered 2012-11-27: 6 mL

## 2012-11-27 MED ORDER — PROMETHAZINE HCL 25 MG/ML IJ SOLN: 6.2500 mg | INTRAMUSCULAR | Status: DC | PRN

## 2012-11-27 MED ORDER — 0.9 % SODIUM CHLORIDE (POUR BTL) OPTIME
TOPICAL | Status: DC | PRN
  Administered 2012-11-27: 1000 mL

## 2012-11-27 MED ORDER — CHLORHEXIDINE GLUCONATE 4 % EX LIQD
1.0000 "application " | Freq: Once | CUTANEOUS | Status: DC
  Filled 2012-11-27: qty 15

## 2012-11-27 MED ORDER — LIDOCAINE HCL 1 % IJ SOLN
INTRAMUSCULAR | Status: DC | PRN
  Administered 2012-11-27: 100 mg via INTRADERMAL

## 2012-11-27 MED ORDER — CEFAZOLIN SODIUM-DEXTROSE 2-3 GM-% IV SOLR: 2.0000 g | INTRAVENOUS | Status: DC

## 2012-11-27 MED ORDER — BUPIVACAINE-EPINEPHRINE 0.25% -1:200000 IJ SOLN
INTRAMUSCULAR | Status: AC
  Filled 2012-11-27: qty 1

## 2012-11-27 MED ORDER — FENTANYL CITRATE 0.05 MG/ML IJ SOLN: 25.0000 ug | INTRAMUSCULAR | Status: DC | PRN

## 2012-11-27 MED ORDER — PROPOFOL 10 MG/ML IV BOLUS
INTRAVENOUS | Status: DC | PRN
  Administered 2012-11-27: 200 mg via INTRAVENOUS
  Administered 2012-11-27: 100 mg via INTRAVENOUS

## 2012-11-27 MED ORDER — ONDANSETRON HCL 4 MG/2ML IJ SOLN
INTRAMUSCULAR | Status: DC | PRN
  Administered 2012-11-27: 4 mg via INTRAVENOUS

## 2012-11-27 SURGICAL SUPPLY — 19 items
BLADE SURG SZ20 CARB STEEL (BLADE) ×2 IMPLANT
BRIEF STRETCH FOR OB PAD LRG (UNDERPADS AND DIAPERS) ×1 IMPLANT
CLOTH BEACON ORANGE TIMEOUT ST (SAFETY) ×2 IMPLANT
DRAPE LG THREE QUARTER DISP (DRAPES) ×2 IMPLANT
GAUZE PACKING IODOFORM 1/2 (PACKING) ×1 IMPLANT
GAUZE PACKING IODOFORM 1/4X5 (PACKING) ×1 IMPLANT
GAUZE SPONGE 4X4 16PLY XRAY LF (GAUZE/BANDAGES/DRESSINGS) ×2 IMPLANT
GLOVE BIOGEL M 8.0 STRL (GLOVE) ×2 IMPLANT
GLOVE BIOGEL PI IND STRL 7.0 (GLOVE) ×1 IMPLANT
GLOVE BIOGEL PI INDICATOR 7.0 (GLOVE) ×1
GOWN STRL NON-REIN LRG LVL3 (GOWN DISPOSABLE) ×1 IMPLANT
GOWN STRL REIN XL XLG (GOWN DISPOSABLE) ×4 IMPLANT
KIT BASIN OR (CUSTOM PROCEDURE TRAY) ×2 IMPLANT
PACK LITHOTOMY IV (CUSTOM PROCEDURE TRAY) ×2 IMPLANT
PENCIL BUTTON HOLSTER BLD 10FT (ELECTRODE) ×2 IMPLANT
SPONGE GAUZE 4X4 12PLY (GAUZE/BANDAGES/DRESSINGS) ×2 IMPLANT
TOWEL OR 17X26 10 PK STRL BLUE (TOWEL DISPOSABLE) ×2 IMPLANT
TUBE ANAEROBIC SPECIMEN COL (MISCELLANEOUS) ×1 IMPLANT
YANKAUER SUCT BULB TIP 10FT TU (MISCELLANEOUS) ×3 IMPLANT

## 2012-11-27 NOTE — H&P (View-Only) (Signed)
Chief Complaint:  Festering chronic perianal nodule  History of Present Illness:  Luis Fisher is an 57 y.o. male who is scheduled to have a total joint replacement August 13 by Dr. Aliuiso for a diffuse hyperostosis condition and these had. This nodule recently recessed her and opened it in the office and got out about a cc of purulence and there is some nontender induration beneath it. He has had this since he had a bout of bad diarrhea 2 years ago but denies recurrent or ongoing diarrhea such as in Crohn's disease. I think it best that we try to excise this he needed to close before he has his hip surgery.  Past Medical History  Diagnosis Date  . Hypertension   . Depression   . DISH (diffuse idiopathic skeletal hyperostosis)   . Hx of colonic polyps   . Arthritis     History reviewed. No pertinent past surgical history.  Current Outpatient Prescriptions  Medication Sig Dispense Refill  . amLODipine (NORVASC) 10 MG tablet Take 1 tablet (10 mg total) by mouth daily.  90 tablet  6  . aspirin 325 MG tablet Take 325 mg by mouth daily.      . clobetasol cream (TEMOVATE) 0.05 % Apply 1 application topically 2 (two) times daily.  60 g  3  . diclofenac sodium (VOLTAREN) 1 % GEL Apply 1 application topically 2 (two) times daily as needed.  100 Tube  6  . furosemide (LASIX) 40 MG tablet Take 1 tablet (40 mg total) by mouth daily as needed.  90 tablet  6  . HYDROcodone-acetaminophen (LORTAB) 7.5-500 MG per tablet Take 2 tablets by mouth every 6 (six) hours as needed for pain.  60 tablet  3  . ibuprofen (ADVIL,MOTRIN) 800 MG tablet Take 1 tablet (800 mg total) by mouth 3 (three) times daily as needed.  270 tablet  6  . metoprolol (LOPRESSOR) 100 MG tablet 1 and 1/2 tablet twice daily  180 tablet  6  . Testosterone (ANDROGEL PUMP) 20.25 MG/ACT (1.62%) GEL Place 4 application onto the skin every morning.  150 g  5  . traMADol (ULTRAM) 50 MG tablet Take 1 tablet (50 mg total) by mouth as needed.  90  tablet  6  . VENTOLIN HFA 108 (90 BASE) MCG/ACT inhaler       . zolpidem (AMBIEN) 10 MG tablet Take 1 tablet (10 mg total) by mouth at bedtime as needed.  30 tablet  2   No current facility-administered medications for this visit.   Review of patient's allergies indicates no known allergies. Family History  Problem Relation Age of Onset  . Hypothyroidism Mother   . Hodgkin's lymphoma Sister   . Hypothyroidism Sister   . Hypertension Sister   . Hypertension Brother    Social History:   reports that he quit smoking about 19 years ago. His smoking use included Cigars. He has never used smokeless tobacco. He reports that he drinks about 1.8 ounces of alcohol per week. He reports that he does not use illicit drugs.   REVIEW OF SYSTEMS - PERTINENT POSITIVES ONLY: See old chart  Physical Exam:   Blood pressure 140/84, pulse 74, resp. rate 14, height 5' 9" (1.753 m), weight 211 lb 9.6 oz (95.981 kg). Body mass index is 31.23 kg/(m^2).  Gen:  WDWN white male NAD  Neurological: Alert and oriented to person, place, and time. Motor and sensory function is grossly intact  Head: Normocephalic and atraumatic.  Eyes: Conjunctivae are   normal. Pupils are equal, round, and reactive to light. No scleral icterus.  Cardiovascular:  SR without murmurs or gallops.  No carotid bruits GU: anterior nodule near the anus;  Induration but no erythema or tenderness Musculoskeletal: Normal range of motion. Extremities are nontender. No cyanosis, edema or clubbing noted Lymphadenopathy: No cervical, preauricular, postauricular or axillary adenopathy is present Skin: Skin is warm and dry. No rash noted. No diaphoresis. No erythema. No pallor. Pscyh: Normal mood and affect. Behavior is normal. Judgment and thought content normal.   LABORATORY RESULTS: Results for orders placed in visit on 10/31/12 (from the past 48 hour(s))  SEDIMENTATION RATE     Status: None   Collection Time    10/31/12  9:12 AM      Result  Value Range   Sed Rate 19  0 - 22 mm/hr  CBC WITH DIFFERENTIAL     Status: None   Collection Time    10/31/12  9:12 AM      Result Value Range   WBC 6.0  4.5 - 10.5 K/uL   RBC 4.80  4.22 - 5.81 Mil/uL   Hemoglobin 15.6  13.0 - 17.0 g/dL   HCT 45.6  39.0 - 52.0 %   MCV 94.9  78.0 - 100.0 fl   MCHC 34.3  30.0 - 36.0 g/dL   RDW 12.3  11.5 - 14.6 %   Platelets 250.0  150.0 - 400.0 K/uL   Neutrophils Relative % 57.0  43.0 - 77.0 %   Lymphocytes Relative 33.2  12.0 - 46.0 %   Monocytes Relative 8.1  3.0 - 12.0 %   Eosinophils Relative 1.1  0.0 - 5.0 %   Basophils Relative 0.6  0.0 - 3.0 %   Neutro Abs 3.4  1.4 - 7.7 K/uL   Lymphs Abs 2.0  0.7 - 4.0 K/uL   Monocytes Absolute 0.5  0.1 - 1.0 K/uL   Eosinophils Absolute 0.1  0.0 - 0.7 K/uL   Basophils Absolute 0.0  0.0 - 0.1 K/uL    RADIOLOGY RESULTS: No results found.  Problem List: Patient Active Problem List   Diagnosis Date Noted  . Perirectal abscess 09/15/2012  . HYPOGONADISM 06/18/2009  . DEPRESSION 02/01/2008  . URI 08/28/2007  . DIFFUSE IDIOPATHIC SKELETAL HYPEROSTOSIS 06/16/2007  . COLONIC POLYPS, HX OF 06/16/2007  . HYPERTENSION 01/16/2007    Assessment & Plan: Nodule in the perianal region that needs to be excised and closed before hip surgery    Matt B. Rhina Kramme, MD, FACS  [facility] [phone] beeper [phone]  11/01/2012 2:52 PM     

## 2012-11-27 NOTE — Anesthesia Preprocedure Evaluation (Addendum)
Anesthesia Evaluation  Patient identified by MRN, date of birth, ID band Patient awake    Reviewed: Allergy & Precautions, H&P , NPO status , Patient's Chart, lab work & pertinent test results  Airway Mallampati: II TM Distance: >3 FB Neck ROM: Full    Dental no notable dental hx.    Pulmonary asthma , pneumonia -, resolved,  breath sounds clear to auscultation  Pulmonary exam normal       Cardiovascular Exercise Tolerance: Good hypertension, Pt. on medications negative cardio ROS  Rhythm:Regular Rate:Normal     Neuro/Psych  Headaches, PSYCHIATRIC DISORDERS Depression CVA    GI/Hepatic negative GI ROS, Neg liver ROS,   Endo/Other  negative endocrine ROS  Renal/GU negative Renal ROS  negative genitourinary   Musculoskeletal negative musculoskeletal ROS (+)   Abdominal (+) + obese,   Peds negative pediatric ROS (+)  Hematology negative hematology ROS (+)   Anesthesia Other Findings   Reproductive/Obstetrics negative OB ROS                           Anesthesia Physical Anesthesia Plan  ASA: III  Anesthesia Plan: General   Post-op Pain Management:    Induction: Intravenous  Airway Management Planned: LMA  Additional Equipment:   Intra-op Plan:   Post-operative Plan: Extubation in OR  Informed Consent: I have reviewed the patients History and Physical, chart, labs and discussed the procedure including the risks, benefits and alternatives for the proposed anesthesia with the patient or authorized representative who has indicated his/her understanding and acceptance.   Dental advisory given  Plan Discussed with: CRNA  Anesthesia Plan Comments: (Carotid ultrasound with moderate stenosis not requiring intervention at this time. Plan followup in one year. )       Anesthesia Quick Evaluation

## 2012-11-27 NOTE — Preoperative (Signed)
Beta Blockers   Reason not to administer Beta Blockers:Not Applicable 

## 2012-11-27 NOTE — Transfer of Care (Signed)
Immediate Anesthesia Transfer of Care Note  Patient: Luis Fisher  Procedure(s) Performed: Procedure(s): EXCISION OF PERIANAL NODULE (N/A)  Patient Location: PACU  Anesthesia Type:General  Level of Consciousness: oriented, sedated and patient cooperative  Airway & Oxygen Therapy: Patient Spontanous Breathing and Patient connected to face mask oxygen  Post-op Assessment: Report given to PACU RN and Post -op Vital signs reviewed and stable  Post vital signs: Reviewed and stable  Complications: No apparent anesthesia complications

## 2012-11-27 NOTE — Op Note (Signed)
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  He was placed in the left lateral decubitus position.  The area had been marked with him and was on the right side.  After prepping and draping a timeout was performed.  The area was a palpable nodule located anterior to Goodsall's line.  I marked the borders of this and excised the overlying skin and carried this down into the subcutaneous fat.  After removing this, I palpated the area and excised two other areas of indurated tissue.  The Bovie was used for hemostasis and the wound was packed with 1/4th inch iodophor gauze.  Mesh pants and taken to the RR in stable condition.    [name], MD, [facility], [place] [phone]

## 2012-11-27 NOTE — Interval H&P Note (Signed)
History and Physical Interval Note:  11/27/2012 1:05 PM  Luis Fisher  has presented today for surgery, with the diagnosis of recurrent drainage  The various methods of treatment have been discussed with the patient and family. After consideration of risks, benefits and other options for treatment, the patient has consented to  Procedure(s): EXCISION OF PERIANAL NODULE (N/A) as a surgical intervention .  The patient's history has been reviewed, patient examined, no change in status, stable for surgery.  I have reviewed the patient's chart and labs.  Questions were answered to the patient's satisfaction.     Mckaylee Dimalanta B

## 2012-11-27 NOTE — Anesthesia Postprocedure Evaluation (Signed)
  Anesthesia Post-op Note  Patient: Luis Fisher  Procedure(s) Performed: Procedure(s) (LRB): EXCISION OF PERIANAL NODULE (N/A)  Patient Location: PACU  Anesthesia Type: General  Level of Consciousness: awake and alert   Airway and Oxygen Therapy: Patient Spontanous Breathing  Post-op Pain: mild  Post-op Assessment: Post-op Vital signs reviewed, Patient's Cardiovascular Status Stable, Respiratory Function Stable, Patent Airway and No signs of Nausea or vomiting  Last Vitals:  Filed Vitals:   11/27/12 1455  BP: 129/79  Pulse: 77  Temp: 36.7 C  Resp: 14    Post-op Vital Signs: stable   Complications: No apparent anesthesia complications

## 2012-11-28 ENCOUNTER — Encounter (INDEPENDENT_AMBULATORY_CARE_PROVIDER_SITE_OTHER): Payer: Medicare Other | Admitting: General Surgery

## 2012-11-28 ENCOUNTER — Encounter (HOSPITAL_COMMUNITY): Payer: Self-pay | Admitting: Surgery

## 2012-11-28 ENCOUNTER — Ambulatory Visit (INDEPENDENT_AMBULATORY_CARE_PROVIDER_SITE_OTHER): Payer: Medicare Other | Admitting: Surgery

## 2012-11-28 ENCOUNTER — Telehealth (INDEPENDENT_AMBULATORY_CARE_PROVIDER_SITE_OTHER): Payer: Self-pay

## 2012-11-28 VITALS — BP 126/80 | HR 64 | Temp 99.2°F | Resp 16 | Ht 69.0 in | Wt 209.0 lb

## 2012-11-28 DIAGNOSIS — K612 Anorectal abscess: Secondary | ICD-10-CM

## 2012-11-28 DIAGNOSIS — K611 Rectal abscess: Secondary | ICD-10-CM

## 2012-11-28 NOTE — Telephone Encounter (Signed)
Patient states she is having serous drainage , he is experience extreme pain. Urg appt today with Dr. Derrell Lolling

## 2012-11-28 NOTE — Patient Instructions (Signed)
Thanks for your patience.  If you need further assistance after leaving the office, please call our office and speak with a [facility] nurse.  [phone].  If you want to leave a message for Dr. Peng Thorstenson, please call his office phone at [phone]. 

## 2012-11-28 NOTE — Telephone Encounter (Signed)
Patient is Luis Fisher, he should see him today.

## 2012-11-28 NOTE — Progress Notes (Signed)
Luis Fisher 57 y.o.  Body mass index is 30.85 kg/(m^2).  Patient Active Problem List   Diagnosis Date Noted  . Perirectal abscess 09/15/2012  . HYPOGONADISM 06/18/2009  . DEPRESSION 02/01/2008  . URI 08/28/2007  . DIFFUSE IDIOPATHIC SKELETAL HYPEROSTOSIS 06/16/2007  . COLONIC POLYPS, HX OF 06/16/2007  . HYPERTENSION 01/16/2007    No Known Allergies  Past Surgical History  Procedure Laterality Date  . Tonsillectomy    . Colonoscopy    . Cyst excision      in office  . Tooth extraction    . Incision and drainage perirectal abscess N/A 11/27/2012    Procedure: EXCISION OF PERIANAL NODULE;  Surgeon: Valarie Merino, MD;  Location: [facility]S;  Service: General;  Laterality: N/A;   [name], MD No diagnosis found.  Packing removed from anus and silver nitrate applied (ouch).  Will see back on the 4th of Sept.  Anterior removal of nodule [name], MD, [facility] [phone] beeper [phone]  11/28/2012 4:01 PM

## 2012-12-01 ENCOUNTER — Telehealth (INDEPENDENT_AMBULATORY_CARE_PROVIDER_SITE_OTHER): Payer: Self-pay | Admitting: General Surgery

## 2012-12-01 NOTE — Telephone Encounter (Signed)
Pt called to report he had the surgery on his pilonidal cyst on Monday and there is still quite a lot of drainage on Friday.  Reassured pt that the drainage may continue for awhile, and the biggest concern is to keep the area as clean as possible.  He understands and will comply.

## 2012-12-19 ENCOUNTER — Other Ambulatory Visit: Payer: Self-pay | Admitting: Orthopedic Surgery

## 2012-12-20 ENCOUNTER — Encounter (INDEPENDENT_AMBULATORY_CARE_PROVIDER_SITE_OTHER): Payer: Medicare Other | Admitting: Surgery

## 2012-12-21 ENCOUNTER — Ambulatory Visit (INDEPENDENT_AMBULATORY_CARE_PROVIDER_SITE_OTHER): Payer: Medicare Other | Admitting: Surgery

## 2012-12-21 ENCOUNTER — Encounter (INDEPENDENT_AMBULATORY_CARE_PROVIDER_SITE_OTHER): Payer: Self-pay | Admitting: Surgery

## 2012-12-21 VITALS — BP 132/80 | HR 76 | Temp 99.7°F | Resp 18 | Ht 69.0 in | Wt 209.0 lb

## 2012-12-21 DIAGNOSIS — K6289 Other specified diseases of anus and rectum: Secondary | ICD-10-CM | POA: Insufficient documentation

## 2012-12-21 DIAGNOSIS — R198 Other specified symptoms and signs involving the digestive system and abdomen: Secondary | ICD-10-CM

## 2012-12-21 NOTE — Patient Instructions (Signed)
Thanks for your patience.  If you need further assistance after leaving the office, please call our office and speak with a [facility] nurse.  [phone].  If you want to leave a message for Dr. Deoni Cosey, please call his office phone at [phone]. 

## 2012-12-21 NOTE — Progress Notes (Signed)
Luis Fisher 57 y.o.  Body mass index is 30.85 kg/(m^2).  Patient Active Problem List   Diagnosis Date Noted  . Perirectal abscess 09/15/2012  . HYPOGONADISM 06/18/2009  . DEPRESSION 02/01/2008  . URI 08/28/2007  . DIFFUSE IDIOPATHIC SKELETAL HYPEROSTOSIS 06/16/2007  . COLONIC POLYPS, HX OF 06/16/2007  . HYPERTENSION 01/16/2007    No Known Allergies  Past Surgical History  Procedure Laterality Date  . Tonsillectomy    . Colonoscopy    . Cyst excision      in office  . Tooth extraction    . Incision and drainage perirectal abscess N/A 11/27/2012    Procedure: EXCISION OF PERIANAL NODULE;  Surgeon: Valarie Merino, MD;  Location: [facility];  Service: General;  Laterality: N/A;   [name], MD No diagnosis found.  The area that I excised has healed and all that remains is induration.  Pathology was benign-acute and chronic inflammation.   There is no drainage and I don't see any reason that he can't have his hip surgery on Mon Sept 15.   Will see again prn.   [name], MD, [facility] [phone] beeper [phone]  12/21/2012 11:07 AM

## 2012-12-25 ENCOUNTER — Other Ambulatory Visit: Payer: Self-pay | Admitting: *Deleted

## 2012-12-25 ENCOUNTER — Other Ambulatory Visit (HOSPITAL_COMMUNITY): Payer: Self-pay | Admitting: Orthopedic Surgery

## 2012-12-25 ENCOUNTER — Encounter (HOSPITAL_COMMUNITY): Payer: Self-pay | Admitting: Pharmacy Technician

## 2012-12-25 MED ORDER — TRAMADOL HCL 50 MG PO TABS: 50.0000 mg | ORAL_TABLET | Freq: Four times a day (QID) | ORAL | Status: DC | PRN

## 2012-12-25 MED ORDER — ZOLPIDEM TARTRATE 10 MG PO TABS: 10.0000 mg | ORAL_TABLET | Freq: Every evening | ORAL | Status: DC | PRN

## 2012-12-25 NOTE — Patient Instructions (Addendum)
20 Alandis Bluemel  12/25/2012   Your procedure is scheduled on: 01-01-2013  Report to [facility] at 1045 AM.  Call this number if you have problems the morning of surgery [phone]   Remember:   Do not eat food  :After Midnight.   clear liquids midnight until 745 am day of surgery, then nothing by mouth.   Take these medicines the morning of surgery with A SIP OF WATER: AMLODIPINE, METOPROLOL, TYLENOL                                SEE [place] PREPARING FOR SURGERY SHEET   Do not wear jewelry, make-up.  Do not wear lotions, powders, or perfumes. You may wear deodorant.   Men may shave face and neck.  Do not bring valuables to the hospital. [facility] IS NOT RESPONSIBLE FOR VALUEABLES.  Contacts, dentures or bridgework may not be worn into surgery.  Leave suitcase in the car. After surgery it may be brought to your room.  For patients admitted to the hospital, checkout time is 11:00 AM the day of discharge.   Patients discharged the day of surgery will not be allowed to drive home.  Name and phone number of your driver:  Special Instructions: N/A   Please read over the following fact sheets that you were given: mrsa information, blood fact sheet, incentive spirometer fact sheet, clear liquid sheet  Call Cain Sieve RN pre op nurse if needed [phone]    FAILURE TO FOLLOW THESE INSTRUCTIONS MAY RESULT IN THE CANCELLATION OF YOUR SURGERY.  PATIENT SIGNATURE___________________________________________  NURSE SIGNATURE_____________________________________________

## 2012-12-25 NOTE — Progress Notes (Signed)
Medical clearance note dr Amador Cunas on chart ekg and 2 view chest xray 11-06-2012 epic lov note dr Rudean Hitt [facility] 10-26-2012 on chart

## 2012-12-26 ENCOUNTER — Encounter (HOSPITAL_COMMUNITY): Payer: Self-pay

## 2012-12-26 ENCOUNTER — Ambulatory Visit (HOSPITAL_COMMUNITY)
Admission: RE | Admit: 2012-12-26 | Discharge: 2012-12-26 | Disposition: A | Discharge status: Home / Self Care | Payer: Medicare Other | Source: Ambulatory Visit | Attending: Orthopedic Surgery | Admitting: Orthopedic Surgery

## 2012-12-26 ENCOUNTER — Encounter (HOSPITAL_COMMUNITY)
Admission: RE | Admit: 2012-12-26 | Discharge: 2012-12-26 | Disposition: A | Discharge status: Home / Self Care | Payer: Medicare Other | Source: Ambulatory Visit | Attending: Orthopedic Surgery | Admitting: Orthopedic Surgery

## 2012-12-26 DIAGNOSIS — M169 Osteoarthritis of hip, unspecified: Secondary | ICD-10-CM | POA: Diagnosis not present

## 2012-12-26 DIAGNOSIS — M1610 Unilateral primary osteoarthritis, unspecified hip: Secondary | ICD-10-CM | POA: Diagnosis not present

## 2012-12-26 DIAGNOSIS — Z01818 Encounter for other preprocedural examination: Secondary | ICD-10-CM | POA: Insufficient documentation

## 2012-12-26 DIAGNOSIS — Z01812 Encounter for preprocedural laboratory examination: Secondary | ICD-10-CM | POA: Insufficient documentation

## 2012-12-26 LAB — URINALYSIS, ROUTINE W REFLEX MICROSCOPIC
Bilirubin Urine: NEGATIVE
Glucose, UA: NEGATIVE mg/dL
Ketones, ur: NEGATIVE mg/dL
pH: 5 (ref 5.0–8.0)

## 2012-12-26 LAB — COMPREHENSIVE METABOLIC PANEL
Albumin: 3.9 g/dL (ref 3.5–5.2)
BUN: 11 mg/dL (ref 6–23)
CO2: 26 mEq/L (ref 19–32)
Chloride: 103 mEq/L (ref 96–112)
Creatinine, Ser: 0.87 mg/dL (ref 0.50–1.35)
GFR calc Af Amer: >90 mL/min (ref >90)
GFR calc non Af Amer: >90 mL/min (ref >90)
Glucose, Bld: 103 mg/dL — ABNORMAL HIGH (ref 70–99)
Total Bilirubin: 0.5 mg/dL (ref 0.3–1.2)

## 2012-12-26 LAB — CBC
HCT: 41.5 % (ref 39.0–52.0)
MCV: 90.2 fL (ref 78.0–100.0)
RDW: 12.5 % (ref 11.5–15.5)
WBC: 6.4 10*3/uL (ref 4.0–10.5)

## 2012-12-26 LAB — PROTIME-INR
INR: 0.96 (ref 0.00–1.49)
Prothrombin Time: 12.6 seconds (ref 11.6–15.2)

## 2012-12-26 LAB — SURGICAL PCR SCREEN
MRSA, PCR: NEGATIVE
Staphylococcus aureus: NEGATIVE

## 2012-12-26 NOTE — Progress Notes (Signed)
12/26/12 1019  OBSTRUCTIVE SLEEP APNEA  Have you ever been diagnosed with sleep apnea through a sleep study? No  Do you snore loudly (loud enough to be heard through closed doors)?  1  Do you often feel tired, fatigued, or sleepy during the daytime? 0  Has anyone observed you stop breathing during your sleep? 0  Do you have, or are you being treated for high blood pressure? 1  BMI more than 35 kg/m2? 0  Age over 59 years old? 1  Neck circumference greater than 40 cm/18 inches? 0  Gender: 1  Obstructive Sleep Apnea Score 4  Score 4 or greater  Results sent to PCP

## 2012-12-27 NOTE — H&P (Signed)
Luis Fisher. [name] DOB: [birth year] Male  Chief Complaint: left hip pain  History of Present Illness The patient is a 57 year old male who presented with a hip problem. He does not have any groin pain. He has some anterior thigh numbness in the right leg with certain positions. He states he has episodes of where one hip is worse than the other and this changes frequently. Pproblems including pain, weakness, catching and stiffness symptoms that have been present for years. He has had a current diagnosis of DISH for the past twelve years.The patient feels as if their symptoms are does feel they are worsening. Symptoms are exacerbated by movement, flexing hip and walking. Current treatment includes opioid analgesics (tramadol, with hydrocodone for extreme pain).  He was diagnosed with DISH (diffuse idiopathic skeletal hyperostosis) 12 years ago after three different rheumatology evaluations. Over the past decade, he has been having stiffening and increasing pain with multiple areas of his body. He was told he has an autofusion in his neck, calcium deposits in both achilles tendons which cause pain and stiffness, and other areas inculding his hands that will flare up from time to time. Weather change pain is quite severe especailly throughout the winter. His gait has been greatly affected and reduced to more of just a shuffling gait. His balance is much worse now. He has much difficulty getting up and down from a chair and has a lot of seditary stiffness. He has been having to use either a walking stick or a cane ofr balance. He was told that he would eventually require total hip replacements and that was 12 years ago. His quality and mobility have been reducing over time. he comes in today for evaluation and possible consideration of hiip replacements. He was told in the past that if he did undergo replacements, he night have to worry about the calcium deposits in the soft tissues and that could  affect his outcomes. The hip is at the stage now where it is limiting what he can and can not do. He is having pain at all times. He has had significant stiffness for many years but if anything all of that is getting worse also. He is ready to proceed with surgery. They have been treated conservatively in the past for the above stated problem and despite conservative measures, they continue to have progressive pain and severe functional limitations and dysfunction. They have failed non-operative management including home exercise, medications. It is felt that they would benefit from undergoing total joint replacement. Risks and benefits of the procedure have been discussed with the patient and they elect to proceed with surgery. There are no active contraindications to surgery such as ongoing infection or rapidly progressive neurological disease.   Past MedicalHistory Pain, Hip (719.45) Achilles tendinitis (726.71) Pain, Cervical (723.1) Osteoarthritis, Hip (715.35) DISH (diffuse idiopathic skeletal hyperostosis) (721.6) Perianal cyst (569.49) Hypertension Osteoarthritis DIFFUSE IDIOPATHIC SKELETAL HYPEROSTOSIS (DISH) Amaurosis Fugax Tinnitus Anxiety Disorder Asthma. Extrinsic, Pollen Induced Bronchitis. Past History- June 2014 Pneumonia. Past History Mumps TIA July 2014 Perianal cyst/abscess  Allergies No Known Drug Allergies.    Family History Cancer. sister   Social History Tobacco use. never smoker Current work status. disabled Children. 0 Alcohol use. current drinker; drinks beer, wine and hard liquor; 2-3 drinks Exercise. Exercises rarely Marital status. married Living situation. live with spouse Post-Surgical Plans. Plan for home.   Medication History Aspirin EC (325MG  Tablet DR, Oral) Active. Metoprolol Tartrate (100MG  Tablet, Oral) Active. AmLODIPine Besylate (10MG  Tablet, Oral)  Active. TraMADol HCl (50MG  Tablet, Oral as needed)  Active. Zolpidem Tartrate (10MG  Tablet, Oral as needed) Active. Voltaren (1% Gel, Transdermal) Active. Furosemide (40MG  Tablet, Oral as needed) Active. Ibuprofen (800MG  Tablet, Oral) Active. Tylenol (325MG  Tablet, Oral) Active. Clobetasol Propionate (0.05% Cream, External) Active.   Past Surgical History Tonsillectomy Colon Polyp Removal - Colonoscopy. Multiple Polyps Cervical fusion Lumbar fusion  Review of Systems General:Not Present- Chills, Fever, Night Sweats, Fatigue, Weight Gain, Weight Loss and Memory Loss. Skin:Not Present- Hives, Itching, Rash, Eczema and Lesions. HEENT:Present- Tinnitus. Not Present- Headache, Double Vision, Visual Loss, Hearing Loss and Dentures. Respiratory:Not Present- Shortness of breath with exertion, Shortness of breath at rest, Allergies, Coughing up blood and Chronic Cough. Cardiovascular:Not Present- Chest Pain, Racing/skipping heartbeats, Difficulty Breathing Lying Down, Murmur, Swelling and Palpitations. Gastrointestinal:Not Present- Bloody Stool, Heartburn, Abdominal Pain, Vomiting, Nausea, Constipation, Diarrhea, Difficulty Swallowing, Jaundice and Loss of appetitie. Male Genitourinary:Not Present- Urinary frequency, Blood in Urine, Weak urinary stream, Discharge, Flank Pain, Incontinence, Painful Urination, Urgency, Urinary Retention and Urinating at Night. Musculoskeletal:Present- Muscle Weakness, Muscle Pain, Joint Swelling, Joint Pain, Back Pain, Morning Stiffness and Spasms. Neurological:Not Present- Tremor, Dizziness, Blackout spells, Paralysis, Difficulty with balance and Weakness. Psychiatric:Not Present- Insomnia.   Vitals Weight: 210 lb Height: 69 in Body Surface Area: 2.15 m Body Mass Index: 31.01 kg/m Pulse: 72 (Regular) BP: 136/84 (Sitting, Left Arm, Standard)   Physical Exam The physical exam findings are as follows: General Mental Status - Alert, cooperative and good historian. General  Appearance- pleasant. Not in acute distress. Orientation- Oriented X3. Build & Nutrition- Well nourished and Well developed. Head and Neck Head- normocephalic, atraumatic . Neck Global Assessment- supple. no bruit auscultated on the right and no bruit auscultated on the left. Eye Pupil- Bilateral- Regular and Round. Motion- Bilateral- EOMI. Chest and Lung Exam Auscultation: Breath sounds:- clear at anterior chest wall and - clear at posterior chest wall. Adventitious sounds:- No Adventitious sounds. Cardiovascular Auscultation:Rhythm- Regular rate and rhythm. Heart Sounds- S1 WNL and S2 WNL. Murmurs & Other Heart Sounds:Auscultation of the heart reveals - No Murmurs. Abdomen Inspection:Contour- Generalized moderate distention. Palpation/Percussion:Tenderness- Abdomen is non-tender to palpation. Rigidity (guarding)- Abdomen is soft. Auscultation:Auscultation of the abdomen reveals - Bowel sounds normal. Male Genitourinary Not done, not pertinent to present illness Musculoskeletal Evaluation of his left hip shows flexion to 90, essentially no internal or external rotation and no abduction. Right hip flexion to about 100, about 5 internal rotation, 5 external rotation, 10 abduction. His pulses, sensation and motor are intact both lower extremities. He has a very stiff slow shuffling gait.  RADIOGRAPHS: AP pelvis, lateral both hips show that there is severe endstage arthritis in both hips with large osteophyte formation. He is bone on bone throughout.  Assessment & Plan DISH (diffuse idiopathic skeletal hyperostosis) (721.6) Osteoarthritis, Hip (715.35) Impression: Left Hip  Plan is for a Left Total Hip Replacement by Dr. Lequita Halt.  Plan is to go home following surgery.  PCP - Dr. Amador Cunas General Surgery - Dr. Daphine Deutscher  The patient will not receive TXA (tranexamic acid) due to: [employer], PA-C

## 2013-01-01 ENCOUNTER — Inpatient Hospital Stay (HOSPITAL_COMMUNITY)
Admission: RE | Admit: 2013-01-01 | Discharge: 2013-01-03 | DRG: 470 | Disposition: A | Discharge status: Home Health | Payer: Medicare Other | Source: Ambulatory Visit | Attending: Orthopedic Surgery | Admitting: Orthopedic Surgery

## 2013-01-01 ENCOUNTER — Encounter (HOSPITAL_COMMUNITY): Payer: Self-pay | Admitting: Certified Registered Nurse Anesthetist

## 2013-01-01 ENCOUNTER — Inpatient Hospital Stay (HOSPITAL_COMMUNITY): Payer: Medicare Other

## 2013-01-01 ENCOUNTER — Encounter (HOSPITAL_COMMUNITY): Payer: Self-pay | Admitting: *Deleted

## 2013-01-01 ENCOUNTER — Encounter (HOSPITAL_COMMUNITY)
Admission: RE | Disposition: A | Discharge status: Home Health | Payer: Self-pay | Source: Ambulatory Visit | Attending: Orthopedic Surgery

## 2013-01-01 ENCOUNTER — Inpatient Hospital Stay (HOSPITAL_COMMUNITY): Payer: Medicare Other | Admitting: Certified Registered Nurse Anesthetist

## 2013-01-01 DIAGNOSIS — J45909 Unspecified asthma, uncomplicated: Secondary | ICD-10-CM | POA: Diagnosis present

## 2013-01-01 DIAGNOSIS — M948X9 Other specified disorders of cartilage, unspecified sites: Secondary | ICD-10-CM | POA: Diagnosis present

## 2013-01-01 DIAGNOSIS — Z981 Arthrodesis status: Secondary | ICD-10-CM | POA: Diagnosis not present

## 2013-01-01 DIAGNOSIS — Z96649 Presence of unspecified artificial hip joint: Secondary | ICD-10-CM

## 2013-01-01 DIAGNOSIS — D62 Acute posthemorrhagic anemia: Secondary | ICD-10-CM | POA: Diagnosis not present

## 2013-01-01 DIAGNOSIS — M25559 Pain in unspecified hip: Secondary | ICD-10-CM | POA: Diagnosis not present

## 2013-01-01 DIAGNOSIS — I10 Essential (primary) hypertension: Secondary | ICD-10-CM | POA: Diagnosis not present

## 2013-01-01 DIAGNOSIS — Z471 Aftercare following joint replacement surgery: Secondary | ICD-10-CM | POA: Diagnosis not present

## 2013-01-01 DIAGNOSIS — M1610 Unilateral primary osteoarthritis, unspecified hip: Principal | ICD-10-CM | POA: Diagnosis present

## 2013-01-01 DIAGNOSIS — R51 Headache: Secondary | ICD-10-CM | POA: Diagnosis not present

## 2013-01-01 DIAGNOSIS — M169 Osteoarthritis of hip, unspecified: Secondary | ICD-10-CM

## 2013-01-01 DIAGNOSIS — M4810 Ankylosing hyperostosis [Forestier], site unspecified: Secondary | ICD-10-CM | POA: Diagnosis present

## 2013-01-01 HISTORY — PX: TOTAL HIP ARTHROPLASTY: SHX124

## 2013-01-01 SURGERY — ARTHROPLASTY, HIP, TOTAL,POSTERIOR APPROACH
Anesthesia: General | Site: Hip | Laterality: Left | Wound class: Clean

## 2013-01-01 MED ORDER — BUPIVACAINE HCL 0.25 % IJ SOLN
INTRAMUSCULAR | Status: DC | PRN
  Administered 2013-01-01: 20 mL

## 2013-01-01 MED ORDER — RIVAROXABAN 10 MG PO TABS
10.0000 mg | ORAL_TABLET | Freq: Every day | ORAL | Status: DC
  Administered 2013-01-02 – 2013-01-03 (×2): 10 mg via ORAL
  Filled 2013-01-01 (×3): qty 1

## 2013-01-01 MED ORDER — PHENOL 1.4 % MT LIQD
1.0000 | OROMUCOSAL | Status: DC | PRN
  Filled 2013-01-01: qty 177

## 2013-01-01 MED ORDER — ONDANSETRON HCL 4 MG PO TABS: 4.0000 mg | ORAL_TABLET | Freq: Four times a day (QID) | ORAL | Status: DC | PRN

## 2013-01-01 MED ORDER — SODIUM CHLORIDE 0.9 % IV SOLN: INTRAVENOUS | Status: DC

## 2013-01-01 MED ORDER — ROCURONIUM BROMIDE 100 MG/10ML IV SOLN
INTRAVENOUS | Status: DC | PRN
  Administered 2013-01-01: 50 mg via INTRAVENOUS

## 2013-01-01 MED ORDER — LACTATED RINGERS IV SOLN
INTRAVENOUS | Status: DC
  Administered 2013-01-01: 1000 mL via INTRAVENOUS

## 2013-01-01 MED ORDER — ACETAMINOPHEN 500 MG PO TABS: 1000.0000 mg | ORAL_TABLET | Freq: Once | ORAL | Status: DC

## 2013-01-01 MED ORDER — ACETAMINOPHEN 500 MG PO TABS
1000.0000 mg | ORAL_TABLET | Freq: Four times a day (QID) | ORAL | Status: AC
  Administered 2013-01-02 (×3): 1000 mg via ORAL
  Filled 2013-01-01 (×5): qty 2

## 2013-01-01 MED ORDER — BUPIVACAINE HCL (PF) 0.25 % IJ SOLN
INTRAMUSCULAR | Status: AC
  Filled 2013-01-01: qty 30

## 2013-01-01 MED ORDER — MORPHINE SULFATE 2 MG/ML IJ SOLN
1.0000 mg | INTRAMUSCULAR | Status: DC | PRN
  Administered 2013-01-01: 1 mg via INTRAVENOUS
  Administered 2013-01-02: 2 mg via INTRAVENOUS
  Filled 2013-01-01 (×2): qty 1

## 2013-01-01 MED ORDER — BUPIVACAINE LIPOSOME 1.3 % IJ SUSP
20.0000 mL | Freq: Once | INTRAMUSCULAR | Status: DC
  Filled 2013-01-01: qty 20

## 2013-01-01 MED ORDER — METHOCARBAMOL 500 MG PO TABS
500.0000 mg | ORAL_TABLET | Freq: Four times a day (QID) | ORAL | Status: DC | PRN
  Administered 2013-01-01 – 2013-01-03 (×6): 500 mg via ORAL
  Filled 2013-01-01 (×6): qty 1

## 2013-01-01 MED ORDER — FLUTICASONE PROPIONATE 50 MCG/ACT NA SUSP
1.0000 | Freq: Every day | NASAL | Status: DC
  Administered 2013-01-02 – 2013-01-03 (×2): 1 via NASAL
  Filled 2013-01-01: qty 16

## 2013-01-01 MED ORDER — OXYCODONE HCL 5 MG PO TABS
5.0000 mg | ORAL_TABLET | ORAL | Status: DC | PRN
  Administered 2013-01-01 – 2013-01-03 (×9): 10 mg via ORAL
  Filled 2013-01-01 (×10): qty 2

## 2013-01-01 MED ORDER — MENTHOL 3 MG MT LOZG
1.0000 | LOZENGE | OROMUCOSAL | Status: DC | PRN
  Filled 2013-01-01: qty 9

## 2013-01-01 MED ORDER — BUPIVACAINE LIPOSOME 1.3 % IJ SUSP
20.0000 mL | Freq: Once | INTRAMUSCULAR | Status: DC
Start: 2013-01-01 — End: 2013-01-01
  Filled 2013-01-01: qty 20

## 2013-01-01 MED ORDER — DEXAMETHASONE SODIUM PHOSPHATE 10 MG/ML IJ SOLN
10.0000 mg | Freq: Every day | INTRAMUSCULAR | Status: AC
  Administered 2013-01-02: 10 mg via INTRAVENOUS
  Filled 2013-01-01: qty 1

## 2013-01-01 MED ORDER — NEOSTIGMINE METHYLSULFATE 1 MG/ML IJ SOLN
INTRAMUSCULAR | Status: DC | PRN
  Administered 2013-01-01: 5 mg via INTRAVENOUS

## 2013-01-01 MED ORDER — TRAMADOL HCL 50 MG PO TABS: 50.0000 mg | ORAL_TABLET | Freq: Four times a day (QID) | ORAL | Status: DC | PRN

## 2013-01-01 MED ORDER — HYDROMORPHONE HCL PF 1 MG/ML IJ SOLN
0.2500 mg | INTRAMUSCULAR | Status: DC | PRN
  Administered 2013-01-01 (×6): 0.5 mg via INTRAVENOUS

## 2013-01-01 MED ORDER — CEFAZOLIN SODIUM-DEXTROSE 2-3 GM-% IV SOLR
2.0000 g | INTRAVENOUS | Status: AC
  Administered 2013-01-01: 2 g via INTRAVENOUS

## 2013-01-01 MED ORDER — FLEET ENEMA 7-19 GM/118ML RE ENEM: 1.0000 | ENEMA | Freq: Once | RECTAL | Status: AC | PRN

## 2013-01-01 MED ORDER — AMLODIPINE BESYLATE 10 MG PO TABS
10.0000 mg | ORAL_TABLET | Freq: Every morning | ORAL | Status: DC
  Administered 2013-01-02 – 2013-01-03 (×2): 10 mg via ORAL
  Filled 2013-01-01 (×2): qty 1

## 2013-01-01 MED ORDER — POLYETHYLENE GLYCOL 3350 17 G PO PACK: 17.0000 g | PACK | Freq: Every day | ORAL | Status: DC | PRN

## 2013-01-01 MED ORDER — DEXAMETHASONE 6 MG PO TABS
10.0000 mg | ORAL_TABLET | Freq: Every day | ORAL | Status: AC
  Filled 2013-01-01: qty 1

## 2013-01-01 MED ORDER — SODIUM CHLORIDE 0.9 % IV SOLN
INTRAVENOUS | Status: DC
  Administered 2013-01-01 – 2013-01-02 (×2): via INTRAVENOUS

## 2013-01-01 MED ORDER — PHENYLEPHRINE HCL 10 MG/ML IJ SOLN
INTRAMUSCULAR | Status: DC | PRN
  Administered 2013-01-01 (×3): 80 ug via INTRAVENOUS

## 2013-01-01 MED ORDER — PROPOFOL 10 MG/ML IV BOLUS
INTRAVENOUS | Status: DC | PRN
  Administered 2013-01-01: 50 mg via INTRAVENOUS
  Administered 2013-01-01: 200 mg via INTRAVENOUS

## 2013-01-01 MED ORDER — METHOCARBAMOL 100 MG/ML IJ SOLN
500.0000 mg | Freq: Four times a day (QID) | INTRAVENOUS | Status: DC | PRN
  Administered 2013-01-01: 500 mg via INTRAVENOUS
  Filled 2013-01-01: qty 5

## 2013-01-01 MED ORDER — METOCLOPRAMIDE HCL 10 MG PO TABS: 5.0000 mg | ORAL_TABLET | Freq: Three times a day (TID) | ORAL | Status: DC | PRN

## 2013-01-01 MED ORDER — HYDROMORPHONE HCL PF 1 MG/ML IJ SOLN
INTRAMUSCULAR | Status: AC
  Filled 2013-01-01: qty 1

## 2013-01-01 MED ORDER — LIDOCAINE HCL (CARDIAC) 20 MG/ML IV SOLN
INTRAVENOUS | Status: DC | PRN
  Administered 2013-01-01: 100 mg via INTRAVENOUS
  Administered 2013-01-01: 80 mg via INTRAVENOUS

## 2013-01-01 MED ORDER — STERILE WATER FOR IRRIGATION IR SOLN
Status: DC | PRN
  Administered 2013-01-01: 3000 mL

## 2013-01-01 MED ORDER — DEXAMETHASONE SODIUM PHOSPHATE 10 MG/ML IJ SOLN
10.0000 mg | Freq: Once | INTRAMUSCULAR | Status: AC
  Administered 2013-01-01: 10 mg via INTRAVENOUS

## 2013-01-01 MED ORDER — FENTANYL CITRATE 0.05 MG/ML IJ SOLN
INTRAMUSCULAR | Status: DC | PRN
  Administered 2013-01-01 (×2): 100 ug via INTRAVENOUS

## 2013-01-01 MED ORDER — CEFAZOLIN SODIUM 1-5 GM-% IV SOLN
1.0000 g | Freq: Four times a day (QID) | INTRAVENOUS | Status: AC
  Administered 2013-01-01 – 2013-01-02 (×2): 1 g via INTRAVENOUS
  Filled 2013-01-01 (×2): qty 50

## 2013-01-01 MED ORDER — BISACODYL 10 MG RE SUPP: 10.0000 mg | Freq: Every day | RECTAL | Status: DC | PRN

## 2013-01-01 MED ORDER — METOCLOPRAMIDE HCL 5 MG/ML IJ SOLN: 5.0000 mg | Freq: Three times a day (TID) | INTRAMUSCULAR | Status: DC | PRN

## 2013-01-01 MED ORDER — MIDAZOLAM HCL 5 MG/5ML IJ SOLN
INTRAMUSCULAR | Status: DC | PRN
  Administered 2013-01-01: 2 mg via INTRAVENOUS

## 2013-01-01 MED ORDER — METOPROLOL TARTRATE 50 MG PO TABS
150.0000 mg | ORAL_TABLET | Freq: Two times a day (BID) | ORAL | Status: DC
  Administered 2013-01-01 – 2013-01-03 (×4): 150 mg via ORAL
  Filled 2013-01-01 (×5): qty 1

## 2013-01-01 MED ORDER — ZOLPIDEM TARTRATE 10 MG PO TABS
10.0000 mg | ORAL_TABLET | Freq: Every evening | ORAL | Status: DC | PRN
  Administered 2013-01-02: 10 mg via ORAL
  Filled 2013-01-01: qty 1

## 2013-01-01 MED ORDER — 0.9 % SODIUM CHLORIDE (POUR BTL) OPTIME
TOPICAL | Status: DC | PRN
  Administered 2013-01-01: 1000 mL

## 2013-01-01 MED ORDER — SODIUM CHLORIDE 0.9 % IJ SOLN
INTRAMUSCULAR | Status: AC
  Filled 2013-01-01: qty 50

## 2013-01-01 MED ORDER — SUCCINYLCHOLINE CHLORIDE 20 MG/ML IJ SOLN
INTRAMUSCULAR | Status: DC | PRN
  Administered 2013-01-01: 100 mg via INTRAVENOUS

## 2013-01-01 MED ORDER — HYDROMORPHONE HCL PF 1 MG/ML IJ SOLN
INTRAMUSCULAR | Status: DC | PRN
  Administered 2013-01-01 (×2): 1 mg via INTRAVENOUS

## 2013-01-01 MED ORDER — DOCUSATE SODIUM 100 MG PO CAPS
100.0000 mg | ORAL_CAPSULE | Freq: Two times a day (BID) | ORAL | Status: DC
Start: 2013-01-01 — End: 2013-01-03
  Administered 2013-01-01 – 2013-01-03 (×3): 100 mg via ORAL

## 2013-01-01 MED ORDER — GLYCOPYRROLATE 0.2 MG/ML IJ SOLN
INTRAMUSCULAR | Status: DC | PRN
  Administered 2013-01-01: 0.6 mg via INTRAVENOUS

## 2013-01-01 MED ORDER — DIPHENHYDRAMINE HCL 12.5 MG/5ML PO ELIX: 12.5000 mg | ORAL_SOLUTION | ORAL | Status: DC | PRN

## 2013-01-01 MED ORDER — CEFAZOLIN SODIUM-DEXTROSE 2-3 GM-% IV SOLR
INTRAVENOUS | Status: AC
  Filled 2013-01-01: qty 50

## 2013-01-01 MED ORDER — PROMETHAZINE HCL 25 MG/ML IJ SOLN: 6.2500 mg | INTRAMUSCULAR | Status: DC | PRN

## 2013-01-01 MED ORDER — ONDANSETRON HCL 4 MG/2ML IJ SOLN: 4.0000 mg | Freq: Four times a day (QID) | INTRAMUSCULAR | Status: DC | PRN

## 2013-01-01 MED ORDER — KETOROLAC TROMETHAMINE 15 MG/ML IJ SOLN
15.0000 mg | Freq: Four times a day (QID) | INTRAMUSCULAR | Status: AC | PRN
  Administered 2013-01-02: 15 mg via INTRAVENOUS
  Filled 2013-01-01: qty 1

## 2013-01-01 MED ORDER — SODIUM CHLORIDE 0.9 % IJ SOLN
INTRAMUSCULAR | Status: DC | PRN
  Administered 2013-01-01: 15:00:00

## 2013-01-01 SURGICAL SUPPLY — 53 items
BAG SPEC THK2 15X12 ZIP CLS (MISCELLANEOUS) ×1
BAG ZIPLOCK 12X15 (MISCELLANEOUS) ×2 IMPLANT
BIT DRILL 2.8X128 (BIT) ×2 IMPLANT
BLADE EXTENDED COATED 6.5IN (ELECTRODE) ×2 IMPLANT
BLADE SAW SAG 73X25 THK (BLADE) ×1
BLADE SAW SGTL 73X25 THK (BLADE) ×1 IMPLANT
CAPT HIP PF COP ×1 IMPLANT
CLOTH BEACON ORANGE TIMEOUT ST (SAFETY) ×2 IMPLANT
DECANTER SPIKE VIAL GLASS SM (MISCELLANEOUS) ×2 IMPLANT
DRAPE INCISE IOBAN 66X45 STRL (DRAPES) ×2 IMPLANT
DRAPE ORTHO SPLIT 77X108 STRL (DRAPES) ×4
DRAPE POUCH INSTRU U-SHP 10X18 (DRAPES) ×2 IMPLANT
DRAPE SURG ORHT 6 SPLT 77X108 (DRAPES) ×2 IMPLANT
DRAPE U-SHAPE 47X51 STRL (DRAPES) ×2 IMPLANT
DRSG ADAPTIC 3X8 NADH LF (GAUZE/BANDAGES/DRESSINGS) ×2 IMPLANT
DRSG MEPILEX BORDER 4X4 (GAUZE/BANDAGES/DRESSINGS) ×2 IMPLANT
DRSG MEPILEX BORDER 4X8 (GAUZE/BANDAGES/DRESSINGS) ×2 IMPLANT
DURAPREP 26ML APPLICATOR (WOUND CARE) ×2 IMPLANT
ELECT REM PT RETURN 9FT ADLT (ELECTROSURGICAL) ×2
ELECTRODE REM PT RTRN 9FT ADLT (ELECTROSURGICAL) ×1 IMPLANT
EVACUATOR 1/8 PVC DRAIN (DRAIN) ×2 IMPLANT
FACESHIELD LNG OPTICON STERILE (SAFETY) ×8 IMPLANT
GLOVE BIO SURGEON STRL SZ7.5 (GLOVE) ×2 IMPLANT
GLOVE BIO SURGEON STRL SZ8 (GLOVE) ×2 IMPLANT
GLOVE BIOGEL PI IND STRL 8 (GLOVE) ×3 IMPLANT
GLOVE BIOGEL PI INDICATOR 8 (GLOVE) ×3
GLOVE SURG SS PI 6.5 STRL IVOR (GLOVE) ×4 IMPLANT
GOWN PREVENTION PLUS LG XLONG (DISPOSABLE) ×4 IMPLANT
GOWN STRL REIN XL XLG (GOWN DISPOSABLE) ×2 IMPLANT
IMMOBILIZER KNEE 20 (SOFTGOODS)
IMMOBILIZER KNEE 20 THIGH 36 (SOFTGOODS) IMPLANT
KIT BASIN OR (CUSTOM PROCEDURE TRAY) ×2 IMPLANT
MANIFOLD NEPTUNE II (INSTRUMENTS) ×2 IMPLANT
NDL SAFETY ECLIPSE 18X1.5 (NEEDLE) ×1 IMPLANT
NEEDLE HYPO 18GX1.5 SHARP (NEEDLE) ×2
NEEDLE HYPO 22GX1.5 SAFETY (NEEDLE) ×2 IMPLANT
NS IRRIG 1000ML POUR BTL (IV SOLUTION) ×2 IMPLANT
PACK TOTAL JOINT (CUSTOM PROCEDURE TRAY) ×2 IMPLANT
PASSER SUT SWANSON 36MM LOOP (INSTRUMENTS) ×2 IMPLANT
POSITIONER SURGICAL ARM (MISCELLANEOUS) ×2 IMPLANT
SPONGE GAUZE 4X4 12PLY (GAUZE/BANDAGES/DRESSINGS) ×1 IMPLANT
STRIP CLOSURE SKIN 1/2X4 (GAUZE/BANDAGES/DRESSINGS) ×3 IMPLANT
SUT ETHIBOND NAB CT1 #1 30IN (SUTURE) ×4 IMPLANT
SUT MNCRL AB 4-0 PS2 18 (SUTURE) ×2 IMPLANT
SUT VIC AB 2-0 CT1 27 (SUTURE) ×6
SUT VIC AB 2-0 CT1 TAPERPNT 27 (SUTURE) ×3 IMPLANT
SUT VLOC 180 0 24IN GS25 (SUTURE) ×4 IMPLANT
SYR 20CC LL (SYRINGE) ×2 IMPLANT
SYR 50ML LL SCALE MARK (SYRINGE) ×2 IMPLANT
TOWEL OR 17X26 10 PK STRL BLUE (TOWEL DISPOSABLE) ×4 IMPLANT
TOWEL OR NON WOVEN STRL DISP B (DISPOSABLE) ×2 IMPLANT
TRAY FOLEY CATH 14FRSI W/METER (CATHETERS) ×2 IMPLANT
WATER STERILE IRR 1500ML POUR (IV SOLUTION) ×2 IMPLANT

## 2013-01-01 NOTE — Anesthesia Procedure Notes (Signed)
Procedure Name: Intubation Date/Time: 01/01/2013 1:48 PM Performed by: [place], Justyce Baby C Pre-anesthesia Checklist: Patient identified, Emergency Drugs available, Suction available, Patient being monitored and Timeout performed Patient Re-evaluated:Patient Re-evaluated prior to inductionOxygen Delivery Method: Circle system utilized Preoxygenation: Pre-oxygenation with 100% oxygen Intubation Type: IV induction and Inhalational induction Ventilation: Two handed mask ventilation required, Oral airway inserted - appropriate to patient size and Mask ventilation without difficulty Laryngoscope Size: Mac and 4 Grade View: Grade I Tube type: Glide rite Tube size: 7.5 mm Number of attempts: 1 Airway Equipment and Method: Rigid stylet and Video-laryngoscopy Secured at: 21 cm Tube secured with: Tape Dental Injury: Teeth and Oropharynx as per pre-operative assessment  Difficulty Due To: Difficulty was anticipated, Difficult Airway- due to reduced neck mobility and Difficult Airway- due to limited oral opening Future Recommendations: Recommend- induction with short-acting agent, and alternative techniques readily available

## 2013-01-01 NOTE — Anesthesia Preprocedure Evaluation (Addendum)
Anesthesia Evaluation  Patient identified by MRN, date of birth, ID band Patient awake    Reviewed: Allergy & Precautions, H&P , NPO status , Patient's Chart, lab work & pertinent test results  Airway Mallampati: II TM Distance: >3 FB Neck ROM: Limited    Dental no notable dental hx.    Pulmonary asthma , pneumonia -,  breath sounds clear to auscultation  Pulmonary exam normal       Cardiovascular Exercise Tolerance: Good hypertension, Rhythm:Regular Rate:Normal     Neuro/Psych  Headaches, PSYCHIATRIC DISORDERS Depression CVA    GI/Hepatic negative GI ROS, Neg liver ROS,   Endo/Other  negative endocrine ROS  Renal/GU negative Renal ROS  negative genitourinary   Musculoskeletal negative musculoskeletal ROS (+)   Abdominal (+) + obese,   Peds negative pediatric ROS (+)  Hematology negative hematology ROS (+)   Anesthesia Other Findings   Reproductive/Obstetrics negative OB ROS                          Anesthesia Physical Anesthesia Plan  ASA: III  Anesthesia Plan: General   Post-op Pain Management:    Induction: Intravenous  Airway Management Planned: Oral ETT  Additional Equipment:   Intra-op Plan:   Post-operative Plan: Extubation in OR  Informed Consent: I have reviewed the patients History and Physical, chart, labs and discussed the procedure including the risks, benefits and alternatives for the proposed anesthesia with the patient or authorized representative who has indicated his/her understanding and acceptance.   Dental advisory given  Plan Discussed with: CRNA  Anesthesia Plan Comments: (Did fine with LMA proseal #4 for perianal procedure recently. He did have a sore throat after that surgery, but otherwise did well.   Did discuss spinal anesthesia, but this may be higher risk due to his symptomatic DISH.)       Anesthesia Quick Evaluation

## 2013-01-01 NOTE — Transfer of Care (Signed)
Immediate Anesthesia Transfer of Care Note  Patient: Luis Fisher  Procedure(s) Performed: Procedure(s): LEFT TOTAL HIP ARTHROPLASTY (Left)  Patient Location: PACU  Anesthesia Type:General  Level of Consciousness: sedated  Airway & Oxygen Therapy: Patient Spontanous Breathing and Patient connected to face mask oxygen  Post-op Assessment: Report given to PACU RN and Post -op Vital signs reviewed and stable  Post vital signs: Reviewed and stable  Complications: No apparent anesthesia complications

## 2013-01-01 NOTE — Anesthesia Postprocedure Evaluation (Signed)
  Anesthesia Post-op Note  Patient: Luis Fisher  Procedure(s) Performed: Procedure(s) (LRB): LEFT TOTAL HIP ARTHROPLASTY (Left)  Patient Location: PACU  Anesthesia Type: General  Level of Consciousness: awake and alert   Airway and Oxygen Therapy: Patient Spontanous Breathing  Post-op Pain: mild  Post-op Assessment: Post-op Vital signs reviewed, Patient's Cardiovascular Status Stable, Respiratory Function Stable, Patent Airway and No signs of Nausea or vomiting  Last Vitals:  Filed Vitals:   01/01/13 1706  BP: 113/77  Pulse: 86  Temp: 37.1 C  Resp: 14    Post-op Vital Signs: stable   Complications: No apparent anesthesia complications

## 2013-01-01 NOTE — Interval H&P Note (Signed)
History and Physical Interval Note:  01/01/2013 11:40 AM  Luis Fisher  has presented today for surgery, with the diagnosis of oa left hip  The various methods of treatment have been discussed with the patient and family. After consideration of risks, benefits and other options for treatment, the patient has consented to  Procedure(s): LEFT TOTAL HIP ARTHROPLASTY (Left) as a surgical intervention .  The patient's history has been reviewed, patient examined, no change in status, stable for surgery.  I have reviewed the patient's chart and labs.  Questions were answered to the patient's satisfaction.     Loanne Drilling

## 2013-01-01 NOTE — Plan of Care (Signed)
Problem: Consults Goal: Diagnosis- Total Joint Replacement Left total hip     

## 2013-01-01 NOTE — Op Note (Signed)
Pre-operative diagnosis- Osteoarthritis Left hip  Post-operative diagnosis- Osteoarthritis  Left hip  Procedure-  LeftTotal Hip Arthroplasty  Surgeon- Luis Fisher. Luis Gladman, MD  Assistant- Luis Peace, PA-C   Anesthesia  General  EBL- 450 ml   Drain Hemovac   Complication- None  Condition-PACU - hemodynamically stable.   Brief Clinical Note- Luis Fisher is a 57 y.o. male with end stage arthritis of his left hip with DISH syndrome and progressively worsening pain and dysfunction. Pain occurs with activity and rest including pain at night. He has tried analgesics, protected weight bearing and rest without benefit. Pain is too severe to attempt physical therapy. Radiographs demonstrate bone on bone arthritis with subchondral cyst formation. He presents now for left THA.  Procedure in detail-   The patient is brought into the operating room and placed on the operating table. After successful administration of General  anesthesia, the patient is placed in the  Right lateral decubitus position with the  Left side up and held in place with the hip positioner. The lower extremity is isolated from the perineum with plastic drapes and time-out is performed by the surgical team. The lower extremity is then prepped and draped in the usual sterile fashion. A short posterolateral incision is made with a ten blade through the subcutaneous tissue to the level of the fascia lata which is incised in line with the skin incision. The sciatic nerve is palpated and protected and the short external rotators and capsule are isolated from the femur. The hip is then dislocated and the center of the femoral head is marked. A trial prosthesis is placed such that the trial head corresponds to the center of the patients' native femoral head. The resection level is marked on the femoral neck and the resection is made with an oscillating saw. The femoral head is removed and femoral retractors placed to gain access to the  femoral canal.      The canal finder is passed into the femoral canal and the canal is thoroughly irrigated with sterile saline to remove the fatty contents. Axial reaming is performed to 15.5  mm, proximal reaming to 20 F  and the sleeve machined to a large. A 20 F large trial sleeve is placed into the proximal femur.      The femur is then retracted anteriorly to gain acetabular exposure. Acetabular retractors are placed and the labrum and osteophytes are removed, Acetabular reaming is performed to 53  mm and a 54  mm Pinnacle acetabular shell is placed in anatomic position with excellent purchase. Additional dome screws were not needed. The permanent 36 mm neutral + 4 Marathon liner is placed into the acetabular shell.      The trial femur is then placed into the femoral canal. The size is 20 x 15  stem with a 36 + 12  neck and a 36 + 0 head with the neck version matching  the patients' native anteversion. The hip is reduced with excellent stability with full extension and full external rotation, 70 degrees flexion with 40 degrees adduction and 90 degrees internal rotation and 90 degrees of flexion with 70 degrees of internal rotation. The operative leg is placed on top of the non-operative leg and the leg lengths are found to be equal. The trials are then removed and the permanent implant of the same size is impacted into the femoral canal. The  Ceramic femoral head of the same size as the trial is placed and the hip is reduced  with the same stability parameters. The operative leg is again placed on top of the non-operative leg and the leg lengths are found to be equal.      The wound is then copiously irrigated with saline solution and the capsule and short external rotators are re-attached to the femur through drill holes with Ethibond suture. The fascia lata is closed over a hemovac drain with #1 vicryl suture and the fascia lata, gluteal muscles and subcutaneous tissues are injected with Exparel 20ml  diluted with saline 30 ml plus 20 ml of .25% Marcaine. The subcutaneous tissues are closed with #1 and2-0 vicryl and the subcuticular layer closed with running 4-0 Monocryl. The drain is hooked to suction, incision cleaned and dried, and steri-srips and a bulky sterile dressing applied. The limb is placed into a knee immobilizer and the patient is awakened and transported to recovery in stable condition.      Please note that a surgical assistant was a medical necessity for this procedure in order to perform it in a safe and expeditious manner. The assistant was necessary to provide retraction to the vital neurovascular structures and to retract and position the limb to allow for anatomic placement of the prosthetic components.  Luis Fisher Luis Dibari, MD    01/01/2013, 3:07 PM

## 2013-01-02 ENCOUNTER — Encounter (HOSPITAL_COMMUNITY): Payer: Self-pay | Admitting: Orthopedic Surgery

## 2013-01-02 DIAGNOSIS — D62 Acute posthemorrhagic anemia: Secondary | ICD-10-CM | POA: Diagnosis not present

## 2013-01-02 LAB — BASIC METABOLIC PANEL
BUN: 11 mg/dL (ref 6–23)
Chloride: 100 mEq/L (ref 96–112)
GFR calc non Af Amer: >90 mL/min (ref >90)
Glucose, Bld: 164 mg/dL — ABNORMAL HIGH (ref 70–99)
Potassium: 4.2 mEq/L (ref 3.5–5.1)
Sodium: 135 mEq/L (ref 135–145)

## 2013-01-02 LAB — CBC
HCT: 31.5 % — ABNORMAL LOW (ref 39.0–52.0)
Hemoglobin: 11.1 g/dL — ABNORMAL LOW (ref 13.0–17.0)
MCHC: 35.2 g/dL (ref 30.0–36.0)
RBC: 3.49 MIL/uL — ABNORMAL LOW (ref 4.22–5.81)
WBC: 9.4 10*3/uL (ref 4.0–10.5)

## 2013-01-02 NOTE — Evaluation (Signed)
Occupational Therapy Evaluation Patient Details Name: Luis Fisher MRN: [id number] DOB: 15-Aug-1955 Today's Date: 01/02/2013 Time: 0930-1000 OT Time Calculation (min): 30 min  OT Assessment / Plan / Recommendation History of present illness L posterior THA on 01/01/13   Clinical Impression   Pt admitted or L THA and has deficits listed below. Pt would benefit from OT to maximize I with adl transfers so he can d/c home safely with his wife.    OT Assessment  Patient needs continued OT Services    Follow Up Recommendations  No OT follow up    Barriers to Discharge      Equipment Recommendations  3 in 1 bedside comode;Other (comment) (possibly a tub bench.  pt will get on own if needed.)    Recommendations for Other Services    Frequency  Min 2X/week    Precautions / Restrictions Precautions Precautions: Posterior Hip Restrictions Weight Bearing Restrictions: No   Pertinent Vitals/Pain Pt with 6/10 pain in hip.    ADL  Eating/Feeding: Performed;Independent Where Assessed - Eating/Feeding: Chair Grooming: Performed;Set up Where Assessed - Grooming: Supported sitting Upper Body Bathing: Simulated;Set up Where Assessed - Upper Body Bathing: Unsupported sitting Lower Body Bathing: Simulated;Moderate assistance Where Assessed - Lower Body Bathing: Supported sit to stand Upper Body Dressing: Simulated;Set up Where Assessed - Upper Body Dressing: Unsupported sitting Lower Body Dressing: Simulated;Minimal assistance Where Assessed - Lower Body Dressing: Supported sit to stand Toilet Transfer: Performed;Minimal assistance Toilet Transfer Method: Surveyor, minerals: Materials engineer and Hygiene: Performed;Minimal assistance Where Assessed - Engineer, mining and Hygiene: Sit to stand from 3-in-1 or toilet Equipment Used: Rolling walker;Reacher;Sock aid Transfers/Ambulation Related to ADLs: Pt walked in room with  min guard.  Min assist to transfer onto commode. ADL Comments: Pt has been having difficulty with LE adls for 12 years now. Pt has reacher, sock aid, shoe horn and knows how to use all of them.  Reinforced hip precautions in relation to adls.    OT Diagnosis: Generalized weakness;Acute pain  OT Problem List: Decreased strength;Decreased knowledge of use of DME or AE;Decreased knowledge of precautions;Pain OT Treatment Interventions: Self-care/ADL training;DME and/or AE instruction   OT Goals(Current goals can be found in the care plan section) Acute Rehab OT Goals Patient Stated Goal: to be independent again. OT Goal Formulation: With patient/family Time For Goal Achievement: 01/09/13 Potential to Achieve Goals: Good ADL Goals Pt Will Perform Tub/Shower Transfer: with min assist;ambulating;Tub transfer Additional ADL Goal #1: Pt will complete all toileting on 3:1 over commode with S  Visit Information  Last OT Received On: 01/02/13 Assistance Needed: +1 History of Present Illness: L posterior THA on 01/01/13       Prior Functioning     Home Living Family/patient expects to be discharged to:: Private residence Living Arrangements: Spouse/significant other Available Help at Discharge: Family Type of Home: House Home Access: Stairs to enter Secretary/administrator of Steps: 1 Entrance Stairs-Rails: None Home Layout: One level Home Equipment: Cane - single point Additional Comments: comfort height toilet. Prior Function Level of Independence: Needs assistance Gait / Transfers Assistance Needed: used cane most of the time, crossed his feet to stand up. L hip also w/ DJD ADL's / Homemaking Assistance Needed: assist with socks.  Has sock aid but states it does not work well for him.  Pt also has reacher/shoe horn etc. Communication Communication: No difficulties Dominant Hand: Right         Vision/Perception  Cognition  Cognition Arousal/Alertness:  Awake/alert Behavior During Therapy: WFL for tasks assessed/performed Overall Cognitive Status: Within Functional Limits for tasks assessed    Extremity/Trunk Assessment Upper Extremity Assessment Upper Extremity Assessment: Overall WFL for tasks assessed (pain in B shoulders due to cane use and Le hip issues.) Lower Extremity Assessment Lower Extremity Assessment: Defer to PT evaluation RLE Deficits / Details: unable to flex hil> 90 LLE Deficits / Details: required assistance to get LLE to edge of bed., able to advance Leg during gait. Cervical / Trunk Assessment Cervical / Trunk Assessment: Other exceptions Cervical / Trunk Exceptions: Neck is fused and has decr. flex/ext/rotation, Back also with decr. mobility     Mobility Bed Mobility Bed Mobility: Supine to Sit;Sitting - Scoot to Edge of Bed Supine to Sit: 2: Max assist;HOB elevated Sitting - Scoot to [place] of Bed: 2: Max assist Details for Bed Mobility Assistance: required verbal cues for moving LLE and buttocks to the edge of the bed. Pt is very rigid in trunk mobility. Pt reports difficulty PTA Transfers Transfers: Sit to Stand;Stand to Sit Sit to Stand: 3: Mod assist Stand to Sit: 4: Min assist Details for Transfer Assistance: Pt requires cues for hip precautions.  Pt gets shoulder pain when pusing up to stand.     Exercise     Balance Balance Balance Assessed: No   End of Session OT - End of Session Equipment Utilized During Treatment: Rolling walker Activity Tolerance: Patient tolerated treatment well Patient left: in chair;with call bell/phone within reach;with family/visitor present Nurse Communication: Mobility status  GO     Hope Budds 01/02/2013, 10:12 AM [phone]

## 2013-01-02 NOTE — Progress Notes (Signed)
[facility]   [facility] is providing the following services: RW and Commode  If patient discharges after hours, please call [phone].   Renard Hamper 01/02/2013, 12:25 PM
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
## 2013-01-02 NOTE — Progress Notes (Signed)
Physical Therapy Treatment Patient Details Name: Luis Fisher MRN: [id number] DOB: 02/21/1956 Today's Date: 01/02/2013 Time: 4098-1191 PT Time Calculation (min): 55 min  PT Assessment / Plan / Recommendation  History of Present Illness L posterior THA on 01/01/13   PT Comments   Pt continues to have most difficulty with bed mobility. Progressing with ROM L hip and ambulation.  Follow Up Recommendations  Home health PT     Does the patient have the potential to tolerate intense rehabilitation     Barriers to Discharge        Equipment Recommendations  Rolling walker with 5" wheels    Recommendations for Other Services    Frequency 7X/week   Progress towards PT Goals Progress towards PT goals: Progressing toward goals  Plan Current plan remains appropriate    Precautions / Restrictions Precautions Precautions: Posterior Hip Precaution Booklet Issued: Yes (comment)   Pertinent Vitals/Pain L hip 4-5. ice   Mobility  Bed Mobility Bed Mobility: Sit to Supine Sit to Supine: 3: Mod assist Details for Bed Mobility Assistance: verbal cues for hip precautions. Pt was unable to scoot onto bed so both legs supported to assist pt back onto bed, cues for precautions. Transfers Transfers: Sit to Stand;Stand to Sit Sit to Stand: 4: Min assist;From chair/3-in-1;With armrests;With upper extremity assist Stand to Sit: 4: Min assist;To bed;With upper extremity assist Details for Transfer Assistance: Pt requires cues for hip precautions.  Pt gets shoulder pain when pusing up to stand. Ambulation/Gait Ambulation/Gait Assistance: 4: Min guard Ambulation Distance (Feet): 160 Feet Assistive device: Rolling walker Ambulation/Gait Assistance Details: vebal cues for sequence, safe turns to R for precautions. Gait Pattern: Step-through pattern General Gait Details: Pt unable to straighten due tp prior neck and  back idiopathic fusions    Exercises Total Joint Exercises Ankle Circles/Pumps:  AROM;Both;10 reps;Seated;Supine Quad Sets: AROM;Left;10 reps;Seated;Supine Short Arc Quad: AROM;Left;10 reps;Supine Heel Slides: AAROM;Left;10 reps;Supine Hip ABduction/ADduction: AAROM;Left;10 reps;Supine   PT Diagnosis: Difficulty walking;Abnormality of gait;Acute pain  PT Problem List: Decreased strength;Decreased range of motion;Decreased activity tolerance;Decreased mobility;Decreased knowledge of precautions;Decreased safety awareness;Decreased knowledge of use of DME;Pain PT Treatment Interventions: DME instruction;Gait training;Stair training;Functional mobility training;Therapeutic activities;Therapeutic exercise;Patient/family education   PT Goals (current goals can now be found in the care plan section) Acute Rehab PT Goals Patient Stated Goal: to be independent again. PT Goal Formulation: With patient/family Time For Goal Achievement: 01/09/13 Potential to Achieve Goals: Good  Visit Information  Last PT Received On: 01/02/13 Assistance Needed: +1 History of Present Illness: L posterior THA on 01/01/13    Subjective Data  Patient Stated Goal: to be independent again.   Cognition  Cognition Arousal/Alertness: Awake/alert    Balance     End of Session PT - End of Session Activity Tolerance: Patient tolerated treatment well Patient left: in bed;with call bell/phone within reach Nurse Communication: Mobility status;Patient requests pain meds   GP     Rada Hay 01/02/2013, 2:31 PM

## 2013-01-02 NOTE — Evaluation (Signed)
Physical Therapy Evaluation Patient Details Name: Luis Fisher MRN: [id number] DOB: 01-02-1956 Today's Date: 01/02/2013 Time: 4132-4401 PT Time Calculation (min): 35 min  PT Assessment / Plan / Recommendation History of Present Illness  L posterior THA on 01/01/13  Clinical Impression  Pt tolerated ambulation in hall. Pt has much difficulty with bed mobility. Pt will benfit from PT while in acute care to improve ROM< strength, and functional mobility.    PT Assessment  Patient needs continued PT services    Follow Up Recommendations  Home health PT    Does the patient have the potential to tolerate intense rehabilitation      Barriers to Discharge        Equipment Recommendations  Rolling walker with 5" wheels    Recommendations for Other Services     Frequency 7X/week    Precautions / Restrictions Precautions Precautions: Posterior Hip Precaution Booklet Issued: Yes (comment) Restrictions Weight Bearing Restrictions: No   Pertinent Vitals/Pain L hip=3-4 Premedicated, ice.      Mobility  Bed Mobility Bed Mobility: Supine to Sit;Sitting - Scoot to Edge of Bed Supine to Sit: 2: Max assist;HOB elevated Sitting - Scoot to [place] of Bed: 2: Max assist Details for Bed Mobility Assistance: required verbal cues for moving LLE and buttocks to the edge of the bed. Pt is very rigid in trunk mobility. Pt reports difficulty PTA Transfers Sit to Stand: 3: Mod assist Stand to Sit: 4: Min assist Details for Transfer Assistance: Pt requires cues for hip precautions.  Pt gets shoulder pain when pusing up to stand. Ambulation/Gait Ambulation/Gait Assistance: 4: Min assist Ambulation Distance (Feet): 80 Feet Assistive device: Rolling walker Ambulation/Gait Assistance Details: verbal cues for safe use of RW and for sequence and positioninside of RW Gait Pattern: Step-to pattern;Trunk flexed General Gait Details: Pt unable to straighten due tp prior back fusions    Exercises Total  Joint Exercises Ankle Circles/Pumps: AROM;Both;10 reps;Seated Quad Sets: AROM;Left;10 reps;Seated Heel Slides: AROM;Left;5 reps;Seated   PT Diagnosis: Difficulty walking;Abnormality of gait;Acute pain  PT Problem List: Decreased strength;Decreased range of motion;Decreased activity tolerance;Decreased mobility;Decreased knowledge of precautions;Decreased safety awareness;Decreased knowledge of use of DME;Pain PT Treatment Interventions: DME instruction;Gait training;Stair training;Functional mobility training;Therapeutic activities;Therapeutic exercise;Patient/family education     PT Goals(Current goals can be found in the care plan section) Acute Rehab PT Goals Patient Stated Goal: to be independent again. PT Goal Formulation: With patient/family Time For Goal Achievement: 01/09/13 Potential to Achieve Goals: Good  Visit Information  Last PT Received On: 01/02/13 Assistance Needed: +1 History of Present Illness: L posterior THA on 01/01/13       Prior Functioning  Home Living Family/patient expects to be discharged to:: Private residence Living Arrangements: Spouse/significant other Available Help at Discharge: Family Type of Home: House Home Access: Stairs to enter Secretary/administrator of Steps: 1 Entrance Stairs-Rails: None Home Layout: One level Home Equipment: Cane - single point Additional Comments: comfort height toilet. Prior Function Level of Independence: Needs assistance Gait / Transfers Assistance Needed: used cane most of the time, crossed his feet to stand up. L hip also w/ DJD ADL's / Homemaking Assistance Needed: assist with socks.  Has sock aid but states it does not work well for him.  Pt also has reacher/shoe horn etc. Communication Communication: No difficulties Dominant Hand: Right    Cognition  Cognition Arousal/Alertness: Awake/alert Behavior During Therapy: WFL for tasks assessed/performed Overall Cognitive Status: Within Functional Limits for  tasks assessed    Extremity/Trunk Assessment Upper Extremity  Assessment Upper Extremity Assessment: Overall WFL for tasks assessed (pain in B shoulders due to cane use and Le hip issues.) Lower Extremity Assessment Lower Extremity Assessment: Defer to PT evaluation RLE Deficits / Details: unable to flex hil> 90 LLE Deficits / Details: required assistance to get LLE to edge of bed., able to advance Leg during gait. Cervical / Trunk Assessment Cervical / Trunk Assessment: Other exceptions Cervical / Trunk Exceptions: Neck is fused and has decr. flex/ext/rotation, Back also with decr. mobility   Balance Balance Balance Assessed: No  End of Session PT - End of Session Activity Tolerance: Patient tolerated treatment well Patient left: in chair;with family/visitor present Nurse Communication: Mobility status  GP     Rada Hay 01/02/2013, 11:31 AM

## 2013-01-02 NOTE — Progress Notes (Signed)
Utilization review completed.  

## 2013-01-03 LAB — BASIC METABOLIC PANEL
BUN: 16 mg/dL (ref 6–23)
CO2: 24 mEq/L (ref 19–32)
GFR calc non Af Amer: 90 mL/min — ABNORMAL LOW (ref >90)
Glucose, Bld: 133 mg/dL — ABNORMAL HIGH (ref 70–99)
Potassium: 3.9 mEq/L (ref 3.5–5.1)

## 2013-01-03 LAB — CBC
HCT: 31.7 % — ABNORMAL LOW (ref 39.0–52.0)
Hemoglobin: 11 g/dL — ABNORMAL LOW (ref 13.0–17.0)
MCH: 32 pg (ref 26.0–34.0)
MCHC: 34.7 g/dL (ref 30.0–36.0)
RBC: 3.44 MIL/uL — ABNORMAL LOW (ref 4.22–5.81)

## 2013-01-03 MED ORDER — METHOCARBAMOL 500 MG PO TABS: 500.0000 mg | ORAL_TABLET | Freq: Four times a day (QID) | ORAL | Status: DC | PRN

## 2013-01-03 MED ORDER — BISACODYL 10 MG RE SUPP: 10.0000 mg | Freq: Once | RECTAL | Status: DC

## 2013-01-03 MED ORDER — RIVAROXABAN 10 MG PO TABS: 10.0000 mg | ORAL_TABLET | Freq: Every day | ORAL | Status: DC

## 2013-01-03 MED ORDER — OXYCODONE HCL 5 MG PO TABS: 5.0000 mg | ORAL_TABLET | ORAL | Status: DC | PRN

## 2013-01-03 NOTE — Progress Notes (Signed)
Physical Therapy Treatment Patient Details Name: Luis Fisher MRN: [id number] DOB: 01/04/1956 Today's Date: 01/03/2013 Time: 1135-1208 PT Time Calculation (min): 33 min  PT Assessment / Plan / Recommendation  History of Present Illness L posterior THA on 01/01/13   PT Comments   Pt moves slowly but relates L hipis not painful with weightbearing. Pt and wife practiced steps. Ready for DC.  Follow Up Recommendations  Home health PT     Does the patient have the potential to tolerate intense rehabilitation     Barriers to Discharge        Equipment Recommendations  Rolling walker with 5" wheels    Recommendations for Other Services    Frequency     Progress towards PT Goals Progress towards PT goals: Progressing toward goals  Plan Current plan remains appropriate    Precautions / Restrictions Precautions Precautions: Posterior Hip;Fall Precaution Comments: Pt able to state 2/3 hip precautions. Reviewed all with pt/spouse Restrictions Weight Bearing Restrictions: No   Pertinent Vitals/Pain 3-4 L hip.    Mobility  Bed Mobility Bed Mobility: Supine to Sit Supine to Sit: HOB elevated;2: Max assist;With rails Sitting - Scoot to Edge of Bed: 3: Mod assist Details for Bed Mobility Assistance: verbal cues for technique for THPs. Needs assist for L LE over to EOB as significant assist for trunk to upright. Pt's wife states she had to help PTA Transfers Sit to Stand: 4: Min assist;With upper extremity assist;From bed;From chair/3-in-1 Stand to Sit: 4: Min guard;With upper extremity assist;With armrests Details for Transfer Assistance: Increased time and cues for THPs--hand placement and L LE management. Pt states UEs are very fatigued today. Ambulation/Gait Ambulation/Gait Assistance: 4: Min guard Ambulation Distance (Feet): 200 Feet Assistive device: Rolling walker Ambulation/Gait Assistance Details: extra time formdistance walking, cues for sequence and posture. Stairs:  Yes Stairs Assistance: 3: Mod assist Stairs Assistance Details (indicate cue type and reason): wife present fro instruction on steps. Pt does require assistance due to deconditioning and R hip DJD. practiced x 1/ Stair Management Technique: No rails;Step to pattern;Backwards;With walker Number of Stairs: 2    Exercises     PT Diagnosis:    PT Problem List:   PT Treatment Interventions:     PT Goals (current goals can now be found in the care plan section) Acute Rehab PT Goals Patient Stated Goal: to be independent again.  Visit Information  Last PT Received On: 01/03/13 Assistance Needed: +1 History of Present Illness: L posterior THA on 01/01/13    Subjective Data  Patient Stated Goal: to be independent again.   Cognition  Cognition Arousal/Alertness: Awake/alert Behavior During Therapy: WFL for tasks assessed/performed Overall Cognitive Status: Within Functional Limits for tasks assessed    Balance     End of Session PT - End of Session Activity Tolerance: Patient tolerated treatment well Patient left: in chair;with call bell/phone within reach;with family/visitor present Nurse Communication: Mobility status;Patient requests pain meds   GP     Rada Hay 01/03/2013, 3:24 PM

## 2013-01-03 NOTE — Progress Notes (Signed)
Occupational Therapy Treatment Patient Details Name: Luis Fisher MRN: [id number] DOB: 1955/06/23 Today's Date: 01/03/2013 Time: 4098-1191 OT Time Calculation (min): 42 min  OT Assessment / Plan / Recommendation  History of present illness L posterior THA on 01/01/13   OT comments  Pt states UEs are fatigued today and noted UEs shaky with functional transfers to toilet/chair. Wife present for all education. Feel he will benefit from [facility] to progress safety and independence with ADL at home, provide additional caregiver education and address tub transfer safety.   Follow Up Recommendations  Home health OT;Supervision/Assistance - 24 hour    Barriers to Discharge       Equipment Recommendations  3 in 1 bedside comode    Recommendations for Other Services    Frequency Min 2X/week   Progress towards OT Goals Progress towards OT goals:  (pt limited by fatigue)  Plan Discharge plan needs to be updated    Precautions / Restrictions Precautions Precautions: Posterior Hip;Fall; L LE Precaution Booklet Issued: Yes (comment) Precaution Comments: Pt able to state 2/3 hip precautions. Reviewed all with pt/spouse Restrictions Weight Bearing Restrictions: No   Pertinent Vitals/Pain 7/10; reposition, ice, had meds    ADL  Toilet Transfer: Performed;Minimal assistance (much increaesed time.) Toilet Transfer Method: Other (comment) (with walker into bathroom) Toilet Transfer Equipment: Raised toilet seat with arms (or 3-in-1 over toilet) Toileting - Clothing Manipulation and Hygiene: Simulated;Minimal assistance Where Assessed - Glass blower/designer Manipulation and Hygiene: Standing Equipment Used: Rolling walker;Gait belt ADL Comments: Pt states he has AE and wife states he is familiar with how to use AE and she can help also. Pt states UEs are very weak feeling today and fatigued. Noted pt's UEs very shaky during functional transfers. Required much increased time to transfer into bathroom and  back out to chair. Pt also needed mod verbal cues for THPs especially to not turn L foot inward. Discussed to stand to perform toilet hygiene and how to adjust 3in1 for appropriate height. Discussed also safety with tub transfer. Do NOT feel pt is safe to step over into tub currently as he fatigues easily in UEs and has 7/10 pain in hip with functional tranfers. Pt agreeable to let HHOT assess tub transfers and DME. He will sponge bathe initially. [name] on board with OT recommendation.    OT Diagnosis:    OT Problem List:   OT Treatment Interventions:     OT Goals(current goals can now be found in the care plan section) Acute Rehab OT Goals Patient Stated Goal: to be independent again.  Visit Information  Last OT Received On: 01/03/13 Assistance Needed: +1 History of Present Illness: L posterior THA on 01/01/13    Subjective Data      Prior Functioning       Cognition  Cognition Arousal/Alertness: Awake/alert Behavior During Therapy: WFL for tasks assessed/performed Overall Cognitive Status: Within Functional Limits for tasks assessed    Mobility  Bed Mobility Bed Mobility: Supine to Sit Supine to Sit: HOB elevated;2: Max assist;With rails Sitting - Scoot to Edge of Bed: 3: Mod assist Details for Bed Mobility Assistance: verbal cues for technique for THPs. Needs assist for L LE over to EOB as significant assist for trunk to upright. Pt's wife states she had to help PTA Transfers Transfers: Sit to Stand;Stand to Sit Sit to Stand: 4: Min assist;With upper extremity assist;From bed;From chair/3-in-1 Stand to Sit: 4: Min assist;With upper extremity assist;To chair/3-in-1 Details for Transfer Assistance: Increased time and cues for THPs--hand  placement and L LE management. Pt states UEs are very fatigued today.       Balance     End of Session OT - End of Session Equipment Utilized During Treatment: Rolling walker;Gait belt Activity Tolerance: Patient limited by pain;Patient  limited by fatigue Patient left: with call bell/phone within reach;in chair;with family/visitor present  GO     [name] [phone] 01/03/2013, 12:07 PM
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
## 2013-01-03 NOTE — Progress Notes (Signed)
Physical Therapy Treatment Patient Details Name: Coltan Spinello MRN: [id number] DOB: 27-Apr-1955 Today's Date: 01/03/2013 Time: 7829-5621 PT Time Calculation (min): 37 min  PT Assessment / Plan / Recommendation  History of Present Illness L posterior THA on 01/01/13   PT Comments   Pt has increased abduction of L hip today with  o pain. Pt is palnning Dc to home after PT session for stair training.  Follow Up Recommendations  Home health PT     Does the patient have the potential to tolerate intense rehabilitation     Barriers to Discharge        Equipment Recommendations       Recommendations for Other Services    Frequency 7X/week   Progress towards PT Goals Progress towards PT goals: Progressing toward goals  Plan Current plan remains appropriate    Precautions / Restrictions Precautions Precautions: Posterior Hip Precaution Booklet Issued: Yes (comment)   Pertinent Vitals/Pain ^ L hip   Mobility       Exercises Total Joint Exercises Quad Sets: AROM;Left;Supine;10 reps Short Arc Quad: AROM;Left;Supine;10 reps Heel Slides: AAROM;Left;10 reps;Supine Hip ABduction/ADduction: AAROM;Left;15 reps;Supine   PT Diagnosis:    PT Problem List:   PT Treatment Interventions:     PT Goals (current goals can now be found in the care plan section)    Visit Information  Last PT Received On: 01/03/13 Assistance Needed: +1 History of Present Illness: L posterior THA on 01/01/13    Subjective Data      Cognition  Cognition Arousal/Alertness: Awake/alert    Balance     End of Session PT - End of Session Activity Tolerance: Patient tolerated treatment well Patient left: in bed;with call bell/phone within reach   GP     Rada Hay 01/03/2013, 11:20 AM

## 2013-01-03 NOTE — Care Management Note (Signed)
    Page 1 of 2   01/03/2013     5:12:22 PM   CARE MANAGEMENT NOTE 01/03/2013  Patient:  Luis Fisher, Luis Fisher   Account Number:  [id number]  Date Initiated:  01/03/2013  Documentation initiated by:  Colleen Can  Subjective/Objective Assessment:   dx OA LEFT HIP; TOTAL HIP REPLACEMENT-ANTERIOR APPROACH     Action/Plan:   CM SPOKE WITH PATIENT. PLANS ARE FOR PT TO RETURN TO HIS HOME IN [place] WHERE SPOUSE WILL BE CAREGIVER. [facility] WILL PROVIDE HH SERVICES.   Anticipated DC Date:  01/03/2013   Anticipated DC Plan:  HOME W HOME HEALTH SERVICES      DC Planning Services  CM consult      [facility] Choice  HOME HEALTH  DURABLE MEDICAL EQUIPMENT   Choice offered to / List presented to:  C-1 Patient   DME arranged  3-N-1  Levan Hurst      DME agency  [facility]     HH arranged  HH-2 PT      [facility] agency  [facility]   Status of service:  Completed, signed off Medicare Important Message given?   (If response is "NO", the following Medicare IM given date fields will be blank) Date Medicare IM given:   Date Additional Medicare IM given:    Discharge Disposition:  HOME W HOME HEALTH SERVICES  Per UR Regulation:    If discussed at Long Length of Stay Meetings, dates discussed:    Comments:  01/03/2013 Colleen Can BSN RN CCM [phone] PT DISCHARGED WITH [facility] IN PLACE TO START TOMORROW.

## 2013-01-04 DIAGNOSIS — Z96649 Presence of unspecified artificial hip joint: Secondary | ICD-10-CM | POA: Diagnosis not present

## 2013-01-04 DIAGNOSIS — Z96659 Presence of unspecified artificial knee joint: Secondary | ICD-10-CM | POA: Diagnosis not present

## 2013-01-04 DIAGNOSIS — Z4801 Encounter for change or removal of surgical wound dressing: Secondary | ICD-10-CM | POA: Diagnosis not present

## 2013-01-04 DIAGNOSIS — Z471 Aftercare following joint replacement surgery: Secondary | ICD-10-CM | POA: Diagnosis not present

## 2013-01-04 DIAGNOSIS — I10 Essential (primary) hypertension: Secondary | ICD-10-CM | POA: Diagnosis not present

## 2013-01-04 DIAGNOSIS — Z7901 Long term (current) use of anticoagulants: Secondary | ICD-10-CM | POA: Diagnosis not present

## 2013-01-05 DIAGNOSIS — I10 Essential (primary) hypertension: Secondary | ICD-10-CM | POA: Diagnosis not present

## 2013-01-05 DIAGNOSIS — Z96649 Presence of unspecified artificial hip joint: Secondary | ICD-10-CM | POA: Diagnosis not present

## 2013-01-05 DIAGNOSIS — M4810 Ankylosing hyperostosis [Forestier], site unspecified: Secondary | ICD-10-CM | POA: Diagnosis not present

## 2013-01-05 DIAGNOSIS — Z4801 Encounter for change or removal of surgical wound dressing: Secondary | ICD-10-CM | POA: Diagnosis not present

## 2013-01-05 DIAGNOSIS — Z471 Aftercare following joint replacement surgery: Secondary | ICD-10-CM | POA: Diagnosis not present

## 2013-01-05 DIAGNOSIS — Z7901 Long term (current) use of anticoagulants: Secondary | ICD-10-CM | POA: Diagnosis not present

## 2013-01-05 DIAGNOSIS — M542 Cervicalgia: Secondary | ICD-10-CM | POA: Diagnosis not present

## 2013-01-08 DIAGNOSIS — Z7901 Long term (current) use of anticoagulants: Secondary | ICD-10-CM | POA: Diagnosis not present

## 2013-01-08 DIAGNOSIS — Z96649 Presence of unspecified artificial hip joint: Secondary | ICD-10-CM | POA: Diagnosis not present

## 2013-01-08 DIAGNOSIS — Z4801 Encounter for change or removal of surgical wound dressing: Secondary | ICD-10-CM | POA: Diagnosis not present

## 2013-01-08 DIAGNOSIS — I10 Essential (primary) hypertension: Secondary | ICD-10-CM | POA: Diagnosis not present

## 2013-01-08 DIAGNOSIS — Z471 Aftercare following joint replacement surgery: Secondary | ICD-10-CM | POA: Diagnosis not present

## 2013-01-09 DIAGNOSIS — Z7901 Long term (current) use of anticoagulants: Secondary | ICD-10-CM | POA: Diagnosis not present

## 2013-01-09 DIAGNOSIS — Z471 Aftercare following joint replacement surgery: Secondary | ICD-10-CM | POA: Diagnosis not present

## 2013-01-09 DIAGNOSIS — Z96649 Presence of unspecified artificial hip joint: Secondary | ICD-10-CM | POA: Diagnosis not present

## 2013-01-09 DIAGNOSIS — I10 Essential (primary) hypertension: Secondary | ICD-10-CM | POA: Diagnosis not present

## 2013-01-09 DIAGNOSIS — Z4801 Encounter for change or removal of surgical wound dressing: Secondary | ICD-10-CM | POA: Diagnosis not present

## 2013-01-10 DIAGNOSIS — Z7901 Long term (current) use of anticoagulants: Secondary | ICD-10-CM | POA: Diagnosis not present

## 2013-01-10 DIAGNOSIS — Z471 Aftercare following joint replacement surgery: Secondary | ICD-10-CM | POA: Diagnosis not present

## 2013-01-10 DIAGNOSIS — Z96649 Presence of unspecified artificial hip joint: Secondary | ICD-10-CM | POA: Diagnosis not present

## 2013-01-10 DIAGNOSIS — Z4801 Encounter for change or removal of surgical wound dressing: Secondary | ICD-10-CM | POA: Diagnosis not present

## 2013-01-10 DIAGNOSIS — I10 Essential (primary) hypertension: Secondary | ICD-10-CM | POA: Diagnosis not present

## 2013-01-11 DIAGNOSIS — Z4801 Encounter for change or removal of surgical wound dressing: Secondary | ICD-10-CM | POA: Diagnosis not present

## 2013-01-11 DIAGNOSIS — Z7901 Long term (current) use of anticoagulants: Secondary | ICD-10-CM | POA: Diagnosis not present

## 2013-01-11 DIAGNOSIS — Z471 Aftercare following joint replacement surgery: Secondary | ICD-10-CM | POA: Diagnosis not present

## 2013-01-11 DIAGNOSIS — Z96649 Presence of unspecified artificial hip joint: Secondary | ICD-10-CM | POA: Diagnosis not present

## 2013-01-11 DIAGNOSIS — I10 Essential (primary) hypertension: Secondary | ICD-10-CM | POA: Diagnosis not present

## 2013-01-12 DIAGNOSIS — Z96649 Presence of unspecified artificial hip joint: Secondary | ICD-10-CM | POA: Diagnosis not present

## 2013-01-12 DIAGNOSIS — Z471 Aftercare following joint replacement surgery: Secondary | ICD-10-CM | POA: Diagnosis not present

## 2013-01-12 DIAGNOSIS — Z4801 Encounter for change or removal of surgical wound dressing: Secondary | ICD-10-CM | POA: Diagnosis not present

## 2013-01-12 DIAGNOSIS — I10 Essential (primary) hypertension: Secondary | ICD-10-CM | POA: Diagnosis not present

## 2013-01-12 DIAGNOSIS — Z7901 Long term (current) use of anticoagulants: Secondary | ICD-10-CM | POA: Diagnosis not present

## 2013-01-15 DIAGNOSIS — Z96649 Presence of unspecified artificial hip joint: Secondary | ICD-10-CM | POA: Diagnosis not present

## 2013-01-15 DIAGNOSIS — Z471 Aftercare following joint replacement surgery: Secondary | ICD-10-CM | POA: Diagnosis not present

## 2013-01-15 DIAGNOSIS — I10 Essential (primary) hypertension: Secondary | ICD-10-CM | POA: Diagnosis not present

## 2013-01-15 DIAGNOSIS — Z4801 Encounter for change or removal of surgical wound dressing: Secondary | ICD-10-CM | POA: Diagnosis not present

## 2013-01-15 DIAGNOSIS — Z7901 Long term (current) use of anticoagulants: Secondary | ICD-10-CM | POA: Diagnosis not present

## 2013-01-16 DIAGNOSIS — Z7901 Long term (current) use of anticoagulants: Secondary | ICD-10-CM | POA: Diagnosis not present

## 2013-01-16 DIAGNOSIS — I10 Essential (primary) hypertension: Secondary | ICD-10-CM | POA: Diagnosis not present

## 2013-01-16 DIAGNOSIS — Z471 Aftercare following joint replacement surgery: Secondary | ICD-10-CM | POA: Diagnosis not present

## 2013-01-16 DIAGNOSIS — Z96649 Presence of unspecified artificial hip joint: Secondary | ICD-10-CM | POA: Diagnosis not present

## 2013-01-16 DIAGNOSIS — Z4801 Encounter for change or removal of surgical wound dressing: Secondary | ICD-10-CM | POA: Diagnosis not present

## 2013-01-18 DIAGNOSIS — I10 Essential (primary) hypertension: Secondary | ICD-10-CM | POA: Diagnosis not present

## 2013-01-18 DIAGNOSIS — Z471 Aftercare following joint replacement surgery: Secondary | ICD-10-CM | POA: Diagnosis not present

## 2013-01-18 DIAGNOSIS — Z7901 Long term (current) use of anticoagulants: Secondary | ICD-10-CM | POA: Diagnosis not present

## 2013-01-18 DIAGNOSIS — Z96649 Presence of unspecified artificial hip joint: Secondary | ICD-10-CM | POA: Diagnosis not present

## 2013-01-18 DIAGNOSIS — Z4801 Encounter for change or removal of surgical wound dressing: Secondary | ICD-10-CM | POA: Diagnosis not present

## 2013-01-19 DIAGNOSIS — Z7901 Long term (current) use of anticoagulants: Secondary | ICD-10-CM | POA: Diagnosis not present

## 2013-01-19 DIAGNOSIS — I10 Essential (primary) hypertension: Secondary | ICD-10-CM | POA: Diagnosis not present

## 2013-01-19 DIAGNOSIS — Z4801 Encounter for change or removal of surgical wound dressing: Secondary | ICD-10-CM | POA: Diagnosis not present

## 2013-01-19 DIAGNOSIS — Z96649 Presence of unspecified artificial hip joint: Secondary | ICD-10-CM | POA: Diagnosis not present

## 2013-01-19 DIAGNOSIS — Z471 Aftercare following joint replacement surgery: Secondary | ICD-10-CM | POA: Diagnosis not present

## 2013-01-22 DIAGNOSIS — I10 Essential (primary) hypertension: Secondary | ICD-10-CM | POA: Diagnosis not present

## 2013-01-22 DIAGNOSIS — Z471 Aftercare following joint replacement surgery: Secondary | ICD-10-CM | POA: Diagnosis not present

## 2013-01-22 DIAGNOSIS — Z4801 Encounter for change or removal of surgical wound dressing: Secondary | ICD-10-CM | POA: Diagnosis not present

## 2013-01-22 DIAGNOSIS — Z7901 Long term (current) use of anticoagulants: Secondary | ICD-10-CM | POA: Diagnosis not present

## 2013-01-22 DIAGNOSIS — Z96649 Presence of unspecified artificial hip joint: Secondary | ICD-10-CM | POA: Diagnosis not present

## 2013-01-23 DIAGNOSIS — Z7901 Long term (current) use of anticoagulants: Secondary | ICD-10-CM | POA: Diagnosis not present

## 2013-01-23 DIAGNOSIS — I10 Essential (primary) hypertension: Secondary | ICD-10-CM | POA: Diagnosis not present

## 2013-01-23 DIAGNOSIS — Z96649 Presence of unspecified artificial hip joint: Secondary | ICD-10-CM | POA: Diagnosis not present

## 2013-01-23 DIAGNOSIS — Z471 Aftercare following joint replacement surgery: Secondary | ICD-10-CM | POA: Diagnosis not present

## 2013-01-23 DIAGNOSIS — Z4801 Encounter for change or removal of surgical wound dressing: Secondary | ICD-10-CM | POA: Diagnosis not present

## 2013-01-24 DIAGNOSIS — Z96649 Presence of unspecified artificial hip joint: Secondary | ICD-10-CM | POA: Diagnosis not present

## 2013-01-24 DIAGNOSIS — Z7901 Long term (current) use of anticoagulants: Secondary | ICD-10-CM | POA: Diagnosis not present

## 2013-01-24 DIAGNOSIS — Z471 Aftercare following joint replacement surgery: Secondary | ICD-10-CM | POA: Diagnosis not present

## 2013-01-24 DIAGNOSIS — Z4801 Encounter for change or removal of surgical wound dressing: Secondary | ICD-10-CM | POA: Diagnosis not present

## 2013-01-24 DIAGNOSIS — I10 Essential (primary) hypertension: Secondary | ICD-10-CM | POA: Diagnosis not present

## 2013-01-25 DIAGNOSIS — Z4801 Encounter for change or removal of surgical wound dressing: Secondary | ICD-10-CM | POA: Diagnosis not present

## 2013-01-25 DIAGNOSIS — Z96649 Presence of unspecified artificial hip joint: Secondary | ICD-10-CM | POA: Diagnosis not present

## 2013-01-25 DIAGNOSIS — I10 Essential (primary) hypertension: Secondary | ICD-10-CM | POA: Diagnosis not present

## 2013-01-25 DIAGNOSIS — Z7901 Long term (current) use of anticoagulants: Secondary | ICD-10-CM | POA: Diagnosis not present

## 2013-01-25 DIAGNOSIS — Z471 Aftercare following joint replacement surgery: Secondary | ICD-10-CM | POA: Diagnosis not present

## 2013-01-26 DIAGNOSIS — Z4801 Encounter for change or removal of surgical wound dressing: Secondary | ICD-10-CM | POA: Diagnosis not present

## 2013-01-26 DIAGNOSIS — Z96649 Presence of unspecified artificial hip joint: Secondary | ICD-10-CM | POA: Diagnosis not present

## 2013-01-26 DIAGNOSIS — Z7901 Long term (current) use of anticoagulants: Secondary | ICD-10-CM | POA: Diagnosis not present

## 2013-01-26 DIAGNOSIS — I10 Essential (primary) hypertension: Secondary | ICD-10-CM | POA: Diagnosis not present

## 2013-01-26 DIAGNOSIS — Z471 Aftercare following joint replacement surgery: Secondary | ICD-10-CM | POA: Diagnosis not present

## 2013-01-29 DIAGNOSIS — Z7901 Long term (current) use of anticoagulants: Secondary | ICD-10-CM | POA: Diagnosis not present

## 2013-01-29 DIAGNOSIS — Z96649 Presence of unspecified artificial hip joint: Secondary | ICD-10-CM | POA: Diagnosis not present

## 2013-01-29 DIAGNOSIS — Z471 Aftercare following joint replacement surgery: Secondary | ICD-10-CM | POA: Diagnosis not present

## 2013-01-29 DIAGNOSIS — I10 Essential (primary) hypertension: Secondary | ICD-10-CM | POA: Diagnosis not present

## 2013-01-29 DIAGNOSIS — Z4801 Encounter for change or removal of surgical wound dressing: Secondary | ICD-10-CM | POA: Diagnosis not present

## 2013-01-30 DIAGNOSIS — Z96649 Presence of unspecified artificial hip joint: Secondary | ICD-10-CM | POA: Diagnosis not present

## 2013-01-30 DIAGNOSIS — Z4801 Encounter for change or removal of surgical wound dressing: Secondary | ICD-10-CM | POA: Diagnosis not present

## 2013-01-30 DIAGNOSIS — Z7901 Long term (current) use of anticoagulants: Secondary | ICD-10-CM | POA: Diagnosis not present

## 2013-01-30 DIAGNOSIS — I10 Essential (primary) hypertension: Secondary | ICD-10-CM | POA: Diagnosis not present

## 2013-01-30 DIAGNOSIS — Z471 Aftercare following joint replacement surgery: Secondary | ICD-10-CM | POA: Diagnosis not present

## 2013-01-31 DIAGNOSIS — Z7901 Long term (current) use of anticoagulants: Secondary | ICD-10-CM | POA: Diagnosis not present

## 2013-01-31 DIAGNOSIS — Z4801 Encounter for change or removal of surgical wound dressing: Secondary | ICD-10-CM | POA: Diagnosis not present

## 2013-01-31 DIAGNOSIS — Z96649 Presence of unspecified artificial hip joint: Secondary | ICD-10-CM | POA: Diagnosis not present

## 2013-01-31 DIAGNOSIS — I10 Essential (primary) hypertension: Secondary | ICD-10-CM | POA: Diagnosis not present

## 2013-01-31 DIAGNOSIS — Z471 Aftercare following joint replacement surgery: Secondary | ICD-10-CM | POA: Diagnosis not present

## 2013-02-01 DIAGNOSIS — Z471 Aftercare following joint replacement surgery: Secondary | ICD-10-CM | POA: Diagnosis not present

## 2013-02-01 DIAGNOSIS — Z4801 Encounter for change or removal of surgical wound dressing: Secondary | ICD-10-CM | POA: Diagnosis not present

## 2013-02-01 DIAGNOSIS — Z96649 Presence of unspecified artificial hip joint: Secondary | ICD-10-CM | POA: Diagnosis not present

## 2013-02-01 DIAGNOSIS — I10 Essential (primary) hypertension: Secondary | ICD-10-CM | POA: Diagnosis not present

## 2013-02-01 DIAGNOSIS — Z7901 Long term (current) use of anticoagulants: Secondary | ICD-10-CM | POA: Diagnosis not present

## 2013-02-02 DIAGNOSIS — Z7901 Long term (current) use of anticoagulants: Secondary | ICD-10-CM | POA: Diagnosis not present

## 2013-02-02 DIAGNOSIS — Z96649 Presence of unspecified artificial hip joint: Secondary | ICD-10-CM | POA: Diagnosis not present

## 2013-02-02 DIAGNOSIS — I10 Essential (primary) hypertension: Secondary | ICD-10-CM | POA: Diagnosis not present

## 2013-02-02 DIAGNOSIS — Z471 Aftercare following joint replacement surgery: Secondary | ICD-10-CM | POA: Diagnosis not present

## 2013-02-02 DIAGNOSIS — Z4801 Encounter for change or removal of surgical wound dressing: Secondary | ICD-10-CM | POA: Diagnosis not present

## 2013-02-05 ENCOUNTER — Ambulatory Visit (INDEPENDENT_AMBULATORY_CARE_PROVIDER_SITE_OTHER): Payer: Medicare Other | Admitting: Internal Medicine

## 2013-02-05 ENCOUNTER — Encounter: Payer: Self-pay | Admitting: Internal Medicine

## 2013-02-05 VITALS — BP 150/80 | HR 88 | Temp 98.3°F | Resp 20 | Wt 208.0 lb

## 2013-02-05 DIAGNOSIS — M1610 Unilateral primary osteoarthritis, unspecified hip: Secondary | ICD-10-CM

## 2013-02-05 DIAGNOSIS — M169 Osteoarthritis of hip, unspecified: Secondary | ICD-10-CM

## 2013-02-05 DIAGNOSIS — R7302 Impaired glucose tolerance (oral): Secondary | ICD-10-CM

## 2013-02-05 DIAGNOSIS — D649 Anemia, unspecified: Secondary | ICD-10-CM

## 2013-02-05 DIAGNOSIS — R7309 Other abnormal glucose: Secondary | ICD-10-CM | POA: Diagnosis not present

## 2013-02-05 DIAGNOSIS — M948X9 Other specified disorders of cartilage, unspecified sites: Secondary | ICD-10-CM | POA: Diagnosis not present

## 2013-02-05 DIAGNOSIS — R609 Edema, unspecified: Secondary | ICD-10-CM

## 2013-02-05 DIAGNOSIS — R350 Frequency of micturition: Secondary | ICD-10-CM | POA: Diagnosis not present

## 2013-02-05 DIAGNOSIS — Z96649 Presence of unspecified artificial hip joint: Secondary | ICD-10-CM | POA: Diagnosis not present

## 2013-02-05 DIAGNOSIS — R600 Localized edema: Secondary | ICD-10-CM

## 2013-02-05 DIAGNOSIS — Z4801 Encounter for change or removal of surgical wound dressing: Secondary | ICD-10-CM | POA: Diagnosis not present

## 2013-02-05 DIAGNOSIS — Z471 Aftercare following joint replacement surgery: Secondary | ICD-10-CM | POA: Diagnosis not present

## 2013-02-05 DIAGNOSIS — I10 Essential (primary) hypertension: Secondary | ICD-10-CM

## 2013-02-05 DIAGNOSIS — Z7901 Long term (current) use of anticoagulants: Secondary | ICD-10-CM | POA: Diagnosis not present

## 2013-02-05 LAB — CBC WITH DIFFERENTIAL/PLATELET
Basophils Absolute: 0 10*3/uL (ref 0.0–0.1)
Basophils Relative: 0.4 % (ref 0.0–3.0)
Eosinophils Absolute: 0 10*3/uL (ref 0.0–0.7)
HCT: 37.9 % — ABNORMAL LOW (ref 39.0–52.0)
Hemoglobin: 13 g/dL (ref 13.0–17.0)
Lymphs Abs: 2 10*3/uL (ref 0.7–4.0)
MCHC: 34.2 g/dL (ref 30.0–36.0)
Monocytes Relative: 6.8 % (ref 3.0–12.0)
Neutro Abs: 5.6 10*3/uL (ref 1.4–7.7)
RBC: 4.23 Mil/uL (ref 4.22–5.81)
RDW: 13.1 % (ref 11.5–14.6)

## 2013-02-05 LAB — COMPREHENSIVE METABOLIC PANEL
AST: 23 U/L (ref 0–37)
Albumin: 4.1 g/dL (ref 3.5–5.2)
BUN: 17 mg/dL (ref 6–23)
CO2: 28 mEq/L (ref 19–32)
Calcium: 10 mg/dL (ref 8.4–10.5)
Chloride: 103 mEq/L (ref 96–112)
Creatinine, Ser: 0.9 mg/dL (ref 0.4–1.5)
GFR: 87.11 mL/min (ref >60.00)
Glucose, Bld: 121 mg/dL — ABNORMAL HIGH (ref 70–99)
Potassium: 4.5 mEq/L (ref 3.5–5.1)

## 2013-02-05 LAB — POCT URINALYSIS DIPSTICK
Blood, UA: NEGATIVE
Glucose, UA: NEGATIVE
Ketones, UA: NEGATIVE
Leukocytes, UA: NEGATIVE
Spec Grav, UA: 1.025
Urobilinogen, UA: 0.2

## 2013-02-05 MED ORDER — METOPROLOL TARTRATE 100 MG PO TABS: 100.0000 mg | ORAL_TABLET | Freq: Two times a day (BID) | ORAL | Status: DC

## 2013-02-05 NOTE — Patient Instructions (Signed)
Limit your sodium (Salt) intake  Continue physical therapy and orthopedic followup  Take a supplemental  iron pill daily  Decrease metoprolol to 100 mg twice daily  Moderate pain medication if possible
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
## 2013-02-06 ENCOUNTER — Telehealth (INDEPENDENT_AMBULATORY_CARE_PROVIDER_SITE_OTHER): Payer: Self-pay

## 2013-02-06 ENCOUNTER — Ambulatory Visit (HOSPITAL_COMMUNITY): Payer: Medicare Other | Attending: Internal Medicine

## 2013-02-06 DIAGNOSIS — Z96649 Presence of unspecified artificial hip joint: Secondary | ICD-10-CM | POA: Diagnosis not present

## 2013-02-06 DIAGNOSIS — R609 Edema, unspecified: Secondary | ICD-10-CM | POA: Diagnosis not present

## 2013-02-06 DIAGNOSIS — I10 Essential (primary) hypertension: Secondary | ICD-10-CM | POA: Insufficient documentation

## 2013-02-06 DIAGNOSIS — E785 Hyperlipidemia, unspecified: Secondary | ICD-10-CM | POA: Diagnosis not present

## 2013-02-06 DIAGNOSIS — Z87891 Personal history of nicotine dependence: Secondary | ICD-10-CM | POA: Diagnosis not present

## 2013-02-06 DIAGNOSIS — R600 Localized edema: Secondary | ICD-10-CM

## 2013-02-06 DIAGNOSIS — Z471 Aftercare following joint replacement surgery: Secondary | ICD-10-CM | POA: Diagnosis not present

## 2013-02-06 DIAGNOSIS — M7989 Other specified soft tissue disorders: Secondary | ICD-10-CM

## 2013-02-06 DIAGNOSIS — Z7901 Long term (current) use of anticoagulants: Secondary | ICD-10-CM | POA: Diagnosis not present

## 2013-02-06 DIAGNOSIS — Z4801 Encounter for change or removal of surgical wound dressing: Secondary | ICD-10-CM | POA: Diagnosis not present

## 2013-02-06 NOTE — Telephone Encounter (Signed)
Patient states perirectal abscess has returned , offered [facility] appointment today but patient wanted to wait to see DR. Daphine Deutscher if possible. Scheduled 02/07/13 @ 220p if not good please call patient

## 2013-02-07 ENCOUNTER — Telehealth (INDEPENDENT_AMBULATORY_CARE_PROVIDER_SITE_OTHER): Payer: Self-pay | Admitting: General Surgery

## 2013-02-07 ENCOUNTER — Encounter (INDEPENDENT_AMBULATORY_CARE_PROVIDER_SITE_OTHER): Payer: Medicare Other | Admitting: Surgery

## 2013-02-07 DIAGNOSIS — Z4801 Encounter for change or removal of surgical wound dressing: Secondary | ICD-10-CM | POA: Diagnosis not present

## 2013-02-07 DIAGNOSIS — Z96649 Presence of unspecified artificial hip joint: Secondary | ICD-10-CM | POA: Diagnosis not present

## 2013-02-07 DIAGNOSIS — Z7901 Long term (current) use of anticoagulants: Secondary | ICD-10-CM | POA: Diagnosis not present

## 2013-02-07 DIAGNOSIS — Z471 Aftercare following joint replacement surgery: Secondary | ICD-10-CM | POA: Diagnosis not present

## 2013-02-07 DIAGNOSIS — I10 Essential (primary) hypertension: Secondary | ICD-10-CM | POA: Diagnosis not present

## 2013-02-07 NOTE — Telephone Encounter (Signed)
Pt has appt this afternoon for perirectal abscess.  Has developed diarrhea today.  Can he be seen tomorrow instead.  Please call him ASAP:  [phone].

## 2013-02-07 NOTE — Telephone Encounter (Signed)
Called pt with re-appt for tomorrow at 11:00, arrive 10:45. He was very Adult nurse.

## 2013-02-08 ENCOUNTER — Ambulatory Visit (INDEPENDENT_AMBULATORY_CARE_PROVIDER_SITE_OTHER): Payer: Medicare Other | Admitting: Surgery

## 2013-02-08 ENCOUNTER — Encounter (INDEPENDENT_AMBULATORY_CARE_PROVIDER_SITE_OTHER): Payer: Self-pay | Admitting: Surgery

## 2013-02-08 VITALS — BP 126/74 | HR 84 | Temp 97.8°F | Resp 18 | Ht 69.0 in | Wt 208.0 lb

## 2013-02-08 DIAGNOSIS — M1610 Unilateral primary osteoarthritis, unspecified hip: Secondary | ICD-10-CM | POA: Diagnosis not present

## 2013-02-08 DIAGNOSIS — K612 Anorectal abscess: Secondary | ICD-10-CM

## 2013-02-08 DIAGNOSIS — M169 Osteoarthritis of hip, unspecified: Secondary | ICD-10-CM | POA: Diagnosis not present

## 2013-02-08 HISTORY — DX: Anorectal abscess: K61.2

## 2013-02-08 NOTE — Patient Instructions (Signed)
See Dr. Daphine Deutscher when needed

## 2013-02-08 NOTE — Progress Notes (Signed)
Luis Fisher 57 y.o.  Body mass index is 30.7 kg/(m^2).  Patient Active Problem List   Diagnosis Date Noted  . Postoperative anemia due to acute blood loss 01/02/2013  . OA (osteoarthritis) of hip 01/01/2013  . HYPOGONADISM 06/18/2009  . DEPRESSION 02/01/2008  . URI 08/28/2007  . DIFFUSE IDIOPATHIC SKELETAL HYPEROSTOSIS 06/16/2007  . COLONIC POLYPS, HX OF 06/16/2007  . HYPERTENSION 01/16/2007    No Known Allergies  Past Surgical History  Procedure Laterality Date  . Tonsillectomy    . Colonoscopy    . Cyst excision      in office  . Tooth extraction    . Incision and drainage perirectal abscess N/A 11/27/2012    Procedure: EXCISION OF PERIANAL NODULE;  Surgeon: Valarie Merino, MD;  Location: [facility];  Service: General;  Laterality: N/A;  . Total hip arthroplasty Left 01/01/2013    Procedure: LEFT TOTAL HIP ARTHROPLASTY;  Surgeon: Loanne Drilling, MD;  Location: [facility];  Service: Orthopedics;  Laterality: Left;   [name], MD No diagnosis found.  The area on his right buttocks posteriorly where we had opened and debrided this area spontaneously opened and drained some bloody fluid several days ago. Since that time it stopped draining completely and on exam it feels soft. I am unable to express any material from this little hole. I told and to feel free to come back and see me at any time regarding this. [name], MD, [facility] [phone] beeper [phone]  02/08/2013 11:46 AM

## 2013-02-14 ENCOUNTER — Telehealth: Payer: Self-pay | Admitting: Internal Medicine

## 2013-02-14 NOTE — Telephone Encounter (Signed)
Pt needs blood work results °

## 2013-02-15 ENCOUNTER — Other Ambulatory Visit: Payer: Medicare Other

## 2013-02-15 NOTE — Telephone Encounter (Signed)
Please call/notify patient that lab/test/procedure is normal except for mildly elevated blood sugar

## 2013-02-15 NOTE — Telephone Encounter (Signed)
Left message on voicemail to call office.  

## 2013-02-16 NOTE — Telephone Encounter (Signed)
Decrease metoprolol to 50 mg twice daily

## 2013-02-16 NOTE — Telephone Encounter (Signed)
Left a message for return call.  

## 2013-02-16 NOTE — Telephone Encounter (Signed)
Spoke with patient.

## 2013-02-16 NOTE — Telephone Encounter (Signed)
Received a vm from pt advising to leave vm with results on phone.  Pt states he is still cold and wearing long pants when the weather is 73 degrees.  Left detailed message for pt on designated cell phone number.  Pls advise if further action needs to be taken with pt still being cold.

## 2013-02-22 ENCOUNTER — Encounter: Payer: Medicare Other | Admitting: Internal Medicine

## 2013-02-22 ENCOUNTER — Other Ambulatory Visit: Payer: Self-pay

## 2013-03-04 ENCOUNTER — Other Ambulatory Visit: Payer: Self-pay | Admitting: Internal Medicine

## 2013-03-05 ENCOUNTER — Encounter: Payer: Self-pay | Admitting: Internal Medicine

## 2013-03-06 ENCOUNTER — Other Ambulatory Visit (INDEPENDENT_AMBULATORY_CARE_PROVIDER_SITE_OTHER): Payer: Medicare Other

## 2013-03-06 DIAGNOSIS — N401 Enlarged prostate with lower urinary tract symptoms: Secondary | ICD-10-CM

## 2013-03-06 DIAGNOSIS — N138 Other obstructive and reflux uropathy: Secondary | ICD-10-CM | POA: Diagnosis not present

## 2013-03-06 DIAGNOSIS — I10 Essential (primary) hypertension: Secondary | ICD-10-CM

## 2013-03-06 DIAGNOSIS — Z79899 Other long term (current) drug therapy: Secondary | ICD-10-CM | POA: Diagnosis not present

## 2013-03-06 DIAGNOSIS — E039 Hypothyroidism, unspecified: Secondary | ICD-10-CM

## 2013-03-06 DIAGNOSIS — E785 Hyperlipidemia, unspecified: Secondary | ICD-10-CM

## 2013-03-06 DIAGNOSIS — D62 Acute posthemorrhagic anemia: Secondary | ICD-10-CM

## 2013-03-06 LAB — HEPATIC FUNCTION PANEL
ALT: 30 U/L (ref 0–53)
AST: 24 U/L (ref 0–37)
Albumin: 4.2 g/dL (ref 3.5–5.2)
Bilirubin, Direct: 0.1 mg/dL (ref 0.0–0.3)
Total Bilirubin: 0.7 mg/dL (ref 0.3–1.2)

## 2013-03-06 LAB — BASIC METABOLIC PANEL
BUN: 12 mg/dL (ref 6–23)
CO2: 28 mEq/L (ref 19–32)
Chloride: 104 mEq/L (ref 96–112)
GFR: 111.3 mL/min (ref >60.00)
Glucose, Bld: 91 mg/dL (ref 70–99)
Potassium: 4.5 mEq/L (ref 3.5–5.1)
Sodium: 139 mEq/L (ref 135–145)

## 2013-03-06 LAB — LIPID PANEL
HDL: 52.8 mg/dL (ref >39.00)
Total CHOL/HDL Ratio: 5
Triglycerides: 278 mg/dL — ABNORMAL HIGH (ref 0.0–149.0)
VLDL: 55.6 mg/dL — ABNORMAL HIGH (ref 0.0–40.0)

## 2013-03-06 LAB — POCT URINALYSIS DIPSTICK
Bilirubin, UA: NEGATIVE
Blood, UA: NEGATIVE
Glucose, UA: NEGATIVE
Ketones, UA: NEGATIVE
Spec Grav, UA: 1.025

## 2013-03-06 LAB — CBC WITH DIFFERENTIAL/PLATELET
Basophils Absolute: 0 10*3/uL (ref 0.0–0.1)
Eosinophils Absolute: 0.1 10*3/uL (ref 0.0–0.7)
Eosinophils Relative: 1.5 % (ref 0.0–5.0)
HCT: 40.8 % (ref 39.0–52.0)
Hemoglobin: 14 g/dL (ref 13.0–17.0)
Lymphs Abs: 1.7 10*3/uL (ref 0.7–4.0)
MCHC: 34.3 g/dL (ref 30.0–36.0)
MCV: 89.6 fl (ref 78.0–100.0)
Monocytes Absolute: 0.4 10*3/uL (ref 0.1–1.0)
Neutro Abs: 2.7 10*3/uL (ref 1.4–7.7)
Platelets: 250 10*3/uL (ref 150.0–400.0)
RBC: 4.55 Mil/uL (ref 4.22–5.81)
RDW: 14.5 % (ref 11.5–14.6)
WBC: 4.9 10*3/uL (ref 4.5–10.5)

## 2013-03-06 LAB — LDL CHOLESTEROL, DIRECT: Direct LDL: 158.6 mg/dL

## 2013-03-06 LAB — PSA: PSA: 3.12 ng/mL (ref 0.10–4.00)

## 2013-03-06 LAB — TSH: TSH: 1.24 u[IU]/mL (ref 0.35–5.50)

## 2013-03-12 ENCOUNTER — Encounter: Payer: Self-pay | Admitting: Internal Medicine

## 2013-03-12 ENCOUNTER — Ambulatory Visit (INDEPENDENT_AMBULATORY_CARE_PROVIDER_SITE_OTHER): Payer: Medicare Other | Admitting: Internal Medicine

## 2013-03-12 VITALS — BP 150/86 | HR 92 | Temp 97.8°F | Resp 20 | Ht 69.25 in | Wt 215.0 lb

## 2013-03-12 DIAGNOSIS — E291 Testicular hypofunction: Secondary | ICD-10-CM

## 2013-03-12 DIAGNOSIS — I10 Essential (primary) hypertension: Secondary | ICD-10-CM

## 2013-03-12 DIAGNOSIS — M1610 Unilateral primary osteoarthritis, unspecified hip: Secondary | ICD-10-CM | POA: Diagnosis not present

## 2013-03-12 DIAGNOSIS — M169 Osteoarthritis of hip, unspecified: Secondary | ICD-10-CM | POA: Diagnosis not present

## 2013-03-12 DIAGNOSIS — M948X9 Other specified disorders of cartilage, unspecified sites: Secondary | ICD-10-CM

## 2013-03-12 DIAGNOSIS — Z0000 Encounter for general adult medical examination without abnormal findings: Secondary | ICD-10-CM

## 2013-03-12 MED ORDER — TESTOSTERONE 20.25 MG/ACT (1.62%) TD GEL: 2.0000 | Freq: Every day | TRANSDERMAL | Status: DC

## 2013-03-12 MED ORDER — ATORVASTATIN CALCIUM 20 MG PO TABS: 20.0000 mg | ORAL_TABLET | Freq: Every day | ORAL | Status: DC

## 2013-03-12 NOTE — Progress Notes (Signed)
Pre-visit discussion using our clinic review tool. No additional management support is needed unless otherwise documented below in the visit note.  

## 2013-03-12 NOTE — Patient Instructions (Signed)
Limit your sodium (Salt) intake  Please check your blood pressure on a regular basis.  If it is consistently greater than 150/90, please make an office appointment.  Return in 6 months for follow-up  Schedule your colonoscopy to help detect colon cancer.   
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
## 2013-04-04 ENCOUNTER — Encounter: Payer: Self-pay | Admitting: Internal Medicine

## 2013-04-16 DIAGNOSIS — G894 Chronic pain syndrome: Secondary | ICD-10-CM | POA: Diagnosis not present

## 2013-04-16 DIAGNOSIS — M1610 Unilateral primary osteoarthritis, unspecified hip: Secondary | ICD-10-CM | POA: Diagnosis not present

## 2013-04-16 DIAGNOSIS — M169 Osteoarthritis of hip, unspecified: Secondary | ICD-10-CM | POA: Diagnosis not present

## 2013-04-16 DIAGNOSIS — M533 Sacrococcygeal disorders, not elsewhere classified: Secondary | ICD-10-CM | POA: Diagnosis not present

## 2013-04-20 DIAGNOSIS — M533 Sacrococcygeal disorders, not elsewhere classified: Secondary | ICD-10-CM | POA: Diagnosis not present

## 2013-04-20 DIAGNOSIS — M25559 Pain in unspecified hip: Secondary | ICD-10-CM | POA: Diagnosis not present

## 2013-04-24 DIAGNOSIS — H43399 Other vitreous opacities, unspecified eye: Secondary | ICD-10-CM | POA: Diagnosis not present

## 2013-04-24 DIAGNOSIS — H2510 Age-related nuclear cataract, unspecified eye: Secondary | ICD-10-CM | POA: Diagnosis not present

## 2013-05-01 ENCOUNTER — Telehealth: Payer: Self-pay | Admitting: Internal Medicine

## 2013-05-01 MED ORDER — FLUTICASONE PROPIONATE 50 MCG/ACT NA SUSP: 1.0000 | Freq: Every day | NASAL | Status: DC

## 2013-05-01 MED ORDER — TRAMADOL HCL 50 MG PO TABS: 50.0000 mg | ORAL_TABLET | Freq: Four times a day (QID) | ORAL | Status: DC | PRN

## 2013-05-01 NOTE — Telephone Encounter (Signed)
[facility] requesting refill of:  traMADol (ULTRAM) 50 MG tablet fluticasone (FLONASE) 50 MCG/ACT nasal spray  States pt will be going out of town today.

## 2013-05-01 NOTE — Telephone Encounter (Signed)
Rxs done. 

## 2013-05-15 DIAGNOSIS — M533 Sacrococcygeal disorders, not elsewhere classified: Secondary | ICD-10-CM | POA: Diagnosis not present

## 2013-05-15 DIAGNOSIS — G894 Chronic pain syndrome: Secondary | ICD-10-CM | POA: Diagnosis not present

## 2013-05-15 DIAGNOSIS — M25559 Pain in unspecified hip: Secondary | ICD-10-CM | POA: Diagnosis not present

## 2013-06-18 ENCOUNTER — Telehealth: Payer: Self-pay | Admitting: *Deleted

## 2013-06-18 DIAGNOSIS — Z96649 Presence of unspecified artificial hip joint: Secondary | ICD-10-CM | POA: Diagnosis not present

## 2013-06-18 DIAGNOSIS — Z471 Aftercare following joint replacement surgery: Secondary | ICD-10-CM | POA: Diagnosis not present

## 2013-06-18 DIAGNOSIS — M25559 Pain in unspecified hip: Secondary | ICD-10-CM | POA: Diagnosis not present

## 2013-06-18 NOTE — Telephone Encounter (Signed)
Left message on voicemail to call office. Regarding forms to be filled out, Dr. Raliegh Ip is out of the office till 3/17.

## 2013-06-18 NOTE — Telephone Encounter (Signed)
Spoke to pt told him Dr. Raliegh Ip is out of the office till the 17th so he will not get to the form till then. Pt verbalized understanding.

## 2013-09-20 ENCOUNTER — Ambulatory Visit: Payer: Medicare Other | Admitting: Internal Medicine

## 2013-09-21 ENCOUNTER — Ambulatory Visit (INDEPENDENT_AMBULATORY_CARE_PROVIDER_SITE_OTHER): Payer: Medicare Other | Admitting: Internal Medicine

## 2013-09-21 ENCOUNTER — Other Ambulatory Visit: Payer: Self-pay | Admitting: *Deleted

## 2013-09-21 ENCOUNTER — Encounter: Payer: Self-pay | Admitting: Internal Medicine

## 2013-09-21 VITALS — BP 150/90 | HR 74 | Temp 98.8°F | Resp 20 | Ht 69.25 in | Wt 219.0 lb

## 2013-09-21 DIAGNOSIS — F329 Major depressive disorder, single episode, unspecified: Secondary | ICD-10-CM | POA: Diagnosis not present

## 2013-09-21 DIAGNOSIS — E291 Testicular hypofunction: Secondary | ICD-10-CM

## 2013-09-21 DIAGNOSIS — I10 Essential (primary) hypertension: Secondary | ICD-10-CM | POA: Diagnosis not present

## 2013-09-21 DIAGNOSIS — M948X9 Other specified disorders of cartilage, unspecified sites: Secondary | ICD-10-CM

## 2013-09-21 DIAGNOSIS — F3289 Other specified depressive episodes: Secondary | ICD-10-CM

## 2013-09-21 MED ORDER — FLUTICASONE PROPIONATE 50 MCG/ACT NA SUSP: 1.0000 | Freq: Every day | NASAL | Status: DC

## 2013-09-21 MED ORDER — ZOLPIDEM TARTRATE 10 MG PO TABS: 10.0000 mg | ORAL_TABLET | Freq: Every evening | ORAL | Status: DC | PRN

## 2013-09-21 MED ORDER — TRAMADOL HCL 50 MG PO TABS: 50.0000 mg | ORAL_TABLET | Freq: Four times a day (QID) | ORAL | Status: DC | PRN

## 2013-09-21 MED ORDER — TESTOSTERONE 20.25 MG/ACT (1.62%) TD GEL
2.0000 | Freq: Every day | TRANSDERMAL | Status: DC
Start: 2013-09-21 — End: 2014-03-05

## 2013-09-21 NOTE — Patient Instructions (Signed)
Limit your sodium (Salt) intake  Please check your blood pressure on a regular basis.  If it is consistently greater than 150/90, please make an office appointment.  You need to lose weight.  Consider a lower calorie diet and regular exercise.  Return in 6 months for follow-up  
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
## 2013-09-21 NOTE — Progress Notes (Signed)
Pre-visit discussion using our clinic review tool. No additional management support is needed unless otherwise documented below in the visit note.  

## 2013-09-22 ENCOUNTER — Other Ambulatory Visit: Payer: Self-pay | Admitting: Internal Medicine

## 2013-09-24 ENCOUNTER — Telehealth: Payer: Self-pay | Admitting: Internal Medicine

## 2013-09-24 MED ORDER — FUROSEMIDE 40 MG PO TABS: 40.0000 mg | ORAL_TABLET | Freq: Every day | ORAL | Status: DC | PRN

## 2013-09-24 MED ORDER — METOPROLOL TARTRATE 100 MG PO TABS: 100.0000 mg | ORAL_TABLET | Freq: Two times a day (BID) | ORAL | Status: DC

## 2013-09-24 MED ORDER — MELOXICAM 7.5 MG PO TABS: 7.5000 mg | ORAL_TABLET | Freq: Every day | ORAL | Status: DC | PRN

## 2013-09-24 NOTE — Telephone Encounter (Signed)
Pt states methocarbamol (ROBAXIN) 500 MG tablet is also needed on the re-fill list

## 2013-09-24 NOTE — Telephone Encounter (Signed)
Relevant patient education assigned to patient using Emmi. ° °

## 2013-09-24 NOTE — Telephone Encounter (Signed)
Pt notified Rx's sent to pharmacy. Asked pt if taking Furosemide 40 mg? Pt said yes as needed. Told him okay. Rx sent. Pt verbalized understanding.

## 2013-09-24 NOTE — Telephone Encounter (Signed)
[facility], [address] Is requesting re-fill on metoprolol (LOPRESSOR) 100 MG tablet

## 2013-09-24 NOTE — Telephone Encounter (Signed)
[facility] is requesting re-fill on furosemide (LASIX) 40 MG tablet

## 2013-09-24 NOTE — Telephone Encounter (Signed)
Pt also stated that this is the 3rd time he has had an appointment and his re-fills were not sent to pharmacy per his request.

## 2013-12-13 ENCOUNTER — Ambulatory Visit (INDEPENDENT_AMBULATORY_CARE_PROVIDER_SITE_OTHER): Payer: Medicare Other | Admitting: Family Medicine

## 2013-12-13 ENCOUNTER — Telehealth: Payer: Self-pay | Admitting: Internal Medicine

## 2013-12-13 ENCOUNTER — Encounter: Payer: Self-pay | Admitting: Family Medicine

## 2013-12-13 VITALS — BP 132/88 | HR 142 | Temp 102.6°F | Ht 69.25 in | Wt 215.0 lb

## 2013-12-13 DIAGNOSIS — R509 Fever, unspecified: Secondary | ICD-10-CM | POA: Diagnosis not present

## 2013-12-13 DIAGNOSIS — N410 Acute prostatitis: Secondary | ICD-10-CM

## 2013-12-13 DIAGNOSIS — R300 Dysuria: Secondary | ICD-10-CM

## 2013-12-13 LAB — CBC WITH DIFFERENTIAL/PLATELET
BASOS ABS: 0 10*3/uL (ref 0.0–0.1)
Basophils Relative: 0 % (ref 0.0–3.0)
Eosinophils Absolute: 0 10*3/uL (ref 0.0–0.7)
Eosinophils Relative: 0 % (ref 0.0–5.0)
HEMATOCRIT: 45.2 % (ref 39.0–52.0)
HEMOGLOBIN: 15.3 g/dL (ref 13.0–17.0)
LYMPHS ABS: 1 10*3/uL (ref 0.7–4.0)
Lymphocytes Relative: 7 % — ABNORMAL LOW (ref 12.0–46.0)
MCHC: 33.8 g/dL (ref 30.0–36.0)
MCV: 94.6 fl (ref 78.0–100.0)
MONO ABS: 1.1 10*3/uL — AB (ref 0.1–1.0)
MONOS PCT: 8.2 % (ref 3.0–12.0)
NEUTROS ABS: 11.7 10*3/uL — AB (ref 1.4–7.7)
Neutrophils Relative %: 84.8 % — ABNORMAL HIGH (ref 43.0–77.0)
Platelets: 188 10*3/uL (ref 150.0–400.0)
RBC: 4.78 Mil/uL (ref 4.22–5.81)
RDW: 13.1 % (ref 11.5–15.5)
WBC: 13.8 10*3/uL — ABNORMAL HIGH (ref 4.0–10.5)

## 2013-12-13 LAB — POCT URINALYSIS DIPSTICK
Bilirubin, UA: NEGATIVE
Glucose, UA: NEGATIVE
Ketones, UA: 80
Nitrite, UA: NEGATIVE
PROTEIN UA: 100
UROBILINOGEN UA: 0.2
pH, UA: 6

## 2013-12-13 LAB — BASIC METABOLIC PANEL
BUN: 9 mg/dL (ref 6–23)
CO2: 26 mEq/L (ref 19–32)
Calcium: 9.4 mg/dL (ref 8.4–10.5)
Chloride: 100 mEq/L (ref 96–112)
Creatinine, Ser: 1.1 mg/dL (ref 0.4–1.5)
GFR: 74.8 mL/min (ref >60.00)
GLUCOSE: 114 mg/dL — AB (ref 70–99)
Potassium: 4 mEq/L (ref 3.5–5.1)
SODIUM: 137 meq/L (ref 135–145)

## 2013-12-13 MED ORDER — CIPROFLOXACIN HCL 500 MG PO TABS: 500.0000 mg | ORAL_TABLET | Freq: Two times a day (BID) | ORAL | Status: DC

## 2013-12-13 MED ORDER — ONDANSETRON HCL 4 MG PO TABS: 4.0000 mg | ORAL_TABLET | Freq: Three times a day (TID) | ORAL | Status: DC | PRN

## 2013-12-13 MED ORDER — CEFTRIAXONE SODIUM 1 G IJ SOLR
1.0000 g | Freq: Once | INTRAMUSCULAR | Status: AC
  Administered 2013-12-13: 1 g via INTRAMUSCULAR

## 2013-12-13 NOTE — Patient Instructions (Signed)
-  start the antibiotic  -plenty of fluids  -seek care immediately if worsening, unable to tolerate or keep down fluids by mouth, feeling worse, unable to pee or other concerns  -follow up with me or Dr. Burnice Logan tomorrow

## 2013-12-13 NOTE — Telephone Encounter (Signed)
Noted  
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
## 2013-12-13 NOTE — Telephone Encounter (Signed)
Patient Information:  Caller Name: Luis Fisher  Phone: [phone]  Patient: Luis Fisher, Luis Fisher  Gender: Male  DOB: 09-13-1955  Age: 58 Years  PCP: Bluford Kaufmann (Family Practice > 58yrs old)  Office Follow Up:  Does the office need to follow up with this patient?: No  Instructions For The Office: N/A   Symptoms  Reason For Call & Symptoms: Patient calling about urinary symptoms  12/12/13 onset and fever 102.2 by mouth onset 12/13/13.  Frequency, urgency, voiding small amounts.  Go to ED Now or to Office with PCP Approval per Urinatinon Pain - Male guideline due to Unable to urinate and bladder feels very full.  Reviewed Health History In EMR: Yes  Reviewed Medications In EMR: Yes  Reviewed Allergies In EMR: Yes  Reviewed Surgeries / Procedures: Yes  Date of Onset of Symptoms: 12/12/2013  Treatments Tried: Aspirin at 05:30 and 12:00  Treatments Tried Worked: No  Any Fever: Yes  Fever Taken: Oral  Fever Time Of Reading: 12:45:00  Fever Last Reading: 102.2  Guideline(s) Used:  Urination Pain - Male  Disposition Per Guideline:   Go to ED Now (or to Office with PCP Approval)  Reason For Disposition Reached:   Unable to urinate (or only a few drops) and bladder feels very full  Advice Given:  Call Back If:  You become worse.  Fluids  : Drink extra fluids (Reason: to produce a dilute, nonirritating urine).  RN Overrode Recommendation:  Make Appointment  Office open; patient has acute symptoms and no history of prostate problems.  Scheduled appointent at office.  Appointment Scheduled:  12/13/2013 13:30:00 Appointment Scheduled Provider:  Maudie Mercury (TEXT 1st, after 20 mins can call), Jarrett Soho [facility])

## 2013-12-13 NOTE — Progress Notes (Signed)
Pre visit review using our clinic review tool, if applicable. No additional management support is needed unless otherwise documented below in the visit note. 

## 2013-12-13 NOTE — Addendum Note (Signed)
Addended by: Agnes Lawrence on: 12/13/2013 02:44 PM   Modules accepted: Orders

## 2013-12-14 ENCOUNTER — Ambulatory Visit (INDEPENDENT_AMBULATORY_CARE_PROVIDER_SITE_OTHER): Payer: Medicare Other | Admitting: Family Medicine

## 2013-12-14 ENCOUNTER — Encounter: Payer: Self-pay | Admitting: Family Medicine

## 2013-12-14 VITALS — BP 122/78 | HR 93 | Temp 99.9°F | Ht 69.25 in | Wt 212.0 lb

## 2013-12-14 DIAGNOSIS — N410 Acute prostatitis: Secondary | ICD-10-CM

## 2013-12-14 DIAGNOSIS — R300 Dysuria: Secondary | ICD-10-CM

## 2013-12-14 LAB — GRAM STAIN: Gram Stain: NONE SEEN

## 2013-12-14 NOTE — Progress Notes (Signed)
Pre visit review using our clinic review tool, if applicable. No additional management support is needed unless otherwise documented below in the visit note. 

## 2013-12-14 NOTE — Patient Instructions (Signed)
-  we are so glad you are feeling better!  -complete antibiotic course  -follow up with your doctor in about 1 week  -seek care immediately if worsening or relapsing symptoms
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
## 2013-12-16 LAB — CULTURE, URINE COMPREHENSIVE: Colony Count: >=100000

## 2013-12-20 ENCOUNTER — Other Ambulatory Visit (HOSPITAL_COMMUNITY): Payer: Self-pay | Admitting: Cardiology

## 2013-12-20 DIAGNOSIS — I6529 Occlusion and stenosis of unspecified carotid artery: Secondary | ICD-10-CM

## 2013-12-26 ENCOUNTER — Ambulatory Visit (HOSPITAL_COMMUNITY): Payer: Medicare Other | Attending: Internal Medicine | Admitting: *Deleted

## 2013-12-26 DIAGNOSIS — Z8673 Personal history of transient ischemic attack (TIA), and cerebral infarction without residual deficits: Secondary | ICD-10-CM | POA: Insufficient documentation

## 2013-12-26 DIAGNOSIS — I10 Essential (primary) hypertension: Secondary | ICD-10-CM | POA: Diagnosis not present

## 2013-12-26 DIAGNOSIS — I6529 Occlusion and stenosis of unspecified carotid artery: Secondary | ICD-10-CM | POA: Insufficient documentation

## 2013-12-26 DIAGNOSIS — Z87891 Personal history of nicotine dependence: Secondary | ICD-10-CM | POA: Insufficient documentation

## 2013-12-26 DIAGNOSIS — E785 Hyperlipidemia, unspecified: Secondary | ICD-10-CM | POA: Insufficient documentation

## 2013-12-26 NOTE — Progress Notes (Signed)
Carotid Duplex Complete 

## 2013-12-28 DIAGNOSIS — Z96649 Presence of unspecified artificial hip joint: Secondary | ICD-10-CM | POA: Diagnosis not present

## 2013-12-31 DIAGNOSIS — M7660 Achilles tendinitis, unspecified leg: Secondary | ICD-10-CM | POA: Diagnosis not present

## 2013-12-31 DIAGNOSIS — M722 Plantar fascial fibromatosis: Secondary | ICD-10-CM | POA: Diagnosis not present

## 2014-02-01 ENCOUNTER — Encounter: Payer: Self-pay | Admitting: Family Medicine

## 2014-02-01 ENCOUNTER — Ambulatory Visit (INDEPENDENT_AMBULATORY_CARE_PROVIDER_SITE_OTHER): Payer: Medicare Other | Admitting: Family Medicine

## 2014-02-01 VITALS — BP 140/86 | HR 71 | Temp 98.4°F | Ht 69.25 in | Wt 213.0 lb

## 2014-02-01 DIAGNOSIS — I6529 Occlusion and stenosis of unspecified carotid artery: Secondary | ICD-10-CM

## 2014-02-01 DIAGNOSIS — H8113 Benign paroxysmal vertigo, bilateral: Secondary | ICD-10-CM

## 2014-02-01 MED ORDER — MECLIZINE HCL 25 MG PO TABS: 25.0000 mg | ORAL_TABLET | ORAL | Status: DC | PRN

## 2014-02-01 NOTE — Progress Notes (Signed)
   Subjective:    Patient ID: Luis Fisher, male    DOB: [birth year], 58 y.o.   MRN: [id number]  HPI Here for 3 days of intermittent dizziness that makes it seem as if the room is spinning. This occurs when moves, such as getting up out of bed or turning around. It last form 30 seconds to a couple minutes and then goes away. He is often nauseated with these spells. No vision changes or other neurologic deficits. He recently had carotid dopplers showing only 1-30% blockages on each side. His fall allergies have been very active for the past 3 weeks.    Review of Systems  Constitutional: Negative.   HENT: Positive for congestion. Negative for ear pain, postnasal drip, rhinorrhea and sinus pressure.   Eyes: Negative.   Respiratory: Negative.   Cardiovascular: Negative.   Neurological: Positive for dizziness. Negative for tremors, seizures, syncope, facial asymmetry, speech difficulty, weakness, light-headedness, numbness and headaches.       Objective:   Physical Exam  Constitutional: He is oriented to person, place, and time. He appears well-developed and well-nourished. No distress.  HENT:  Head: Normocephalic and atraumatic.  Right Ear: External ear normal.  Left Ear: External ear normal.  Mouth/Throat: Oropharynx is clear and moist.  Eyes: Conjunctivae and EOM are normal. Pupils are equal, round, and reactive to light.  Neck: No thyromegaly present.  Cardiovascular: Normal rate, regular rhythm, normal heart sounds and intact distal pulses.   Pulmonary/Chest: Effort normal and breath sounds normal.  Lymphadenopathy:    He has no cervical adenopathy.  Neurological: He is alert and oriented to person, place, and time. He has normal reflexes. No cranial nerve deficit. He exhibits normal muscle tone. Coordination normal.          Assessment & Plan:  Benign positional vertigo. Rest and drink fluids. Use Flonase daily. Add Allegra daily and use Meclizine prn. Recheck prn

## 2014-02-01 NOTE — Progress Notes (Signed)
Pre visit review using our clinic review tool, if applicable. No additional management support is needed unless otherwise documented below in the visit note. 

## 2014-03-05 ENCOUNTER — Other Ambulatory Visit: Payer: Self-pay | Admitting: Internal Medicine

## 2014-03-27 ENCOUNTER — Encounter: Payer: Medicare Other | Admitting: Internal Medicine

## 2014-04-04 ENCOUNTER — Other Ambulatory Visit: Payer: Self-pay | Admitting: Internal Medicine

## 2014-04-05 ENCOUNTER — Ambulatory Visit: Payer: Medicare Other | Admitting: *Deleted

## 2014-04-05 ENCOUNTER — Ambulatory Visit (INDEPENDENT_AMBULATORY_CARE_PROVIDER_SITE_OTHER): Payer: Medicare Other | Admitting: *Deleted

## 2014-04-05 DIAGNOSIS — Z23 Encounter for immunization: Secondary | ICD-10-CM | POA: Diagnosis not present

## 2014-05-02 ENCOUNTER — Encounter (HOSPITAL_COMMUNITY): Payer: Self-pay | Admitting: Surgery

## 2014-05-03 ENCOUNTER — Encounter: Payer: Self-pay | Admitting: Internal Medicine

## 2014-05-03 ENCOUNTER — Encounter: Payer: Self-pay | Admitting: *Deleted

## 2014-05-03 ENCOUNTER — Ambulatory Visit (INDEPENDENT_AMBULATORY_CARE_PROVIDER_SITE_OTHER): Payer: Medicare Other | Admitting: Internal Medicine

## 2014-05-03 VITALS — BP 140/80 | HR 71 | Temp 98.1°F | Resp 20 | Ht 69.0 in | Wt 214.0 lb

## 2014-05-03 DIAGNOSIS — N400 Enlarged prostate without lower urinary tract symptoms: Secondary | ICD-10-CM

## 2014-05-03 DIAGNOSIS — Z86010 Personal history of colonic polyps: Secondary | ICD-10-CM

## 2014-05-03 DIAGNOSIS — M4810 Ankylosing hyperostosis [Forestier], site unspecified: Secondary | ICD-10-CM

## 2014-05-03 DIAGNOSIS — E349 Endocrine disorder, unspecified: Secondary | ICD-10-CM

## 2014-05-03 DIAGNOSIS — I10 Essential (primary) hypertension: Secondary | ICD-10-CM

## 2014-05-03 DIAGNOSIS — Z0000 Encounter for general adult medical examination without abnormal findings: Secondary | ICD-10-CM

## 2014-05-03 DIAGNOSIS — E785 Hyperlipidemia, unspecified: Secondary | ICD-10-CM

## 2014-05-03 DIAGNOSIS — E291 Testicular hypofunction: Secondary | ICD-10-CM | POA: Diagnosis not present

## 2014-05-03 LAB — POCT URINALYSIS DIPSTICK
Bilirubin, UA: NEGATIVE
Glucose, UA: NEGATIVE
Ketones, UA: NEGATIVE
Leukocytes, UA: NEGATIVE
NITRITE UA: NEGATIVE
Spec Grav, UA: 1.02
Urobilinogen, UA: 0.2
pH, UA: 5.5

## 2014-05-03 LAB — LIPID PANEL
CHOL/HDL RATIO: 4
Cholesterol: 225 mg/dL — ABNORMAL HIGH (ref 0–200)
HDL: 59.7 mg/dL (ref >39.00)
NonHDL: 165.3
TRIGLYCERIDES: 327 mg/dL — AB (ref 0.0–149.0)
VLDL: 65.4 mg/dL — ABNORMAL HIGH (ref 0.0–40.0)

## 2014-05-03 LAB — COMPREHENSIVE METABOLIC PANEL
ALT: 35 U/L (ref 0–53)
AST: 29 U/L (ref 0–37)
Albumin: 4.4 g/dL (ref 3.5–5.2)
Alkaline Phosphatase: 19 U/L — ABNORMAL LOW (ref 39–117)
BUN: 20 mg/dL (ref 6–23)
CHLORIDE: 99 meq/L (ref 96–112)
CO2: 27 mEq/L (ref 19–32)
Calcium: 10 mg/dL (ref 8.4–10.5)
Creatinine, Ser: 1.05 mg/dL (ref 0.40–1.50)
GFR: 76.35 mL/min (ref >60.00)
Glucose, Bld: 95 mg/dL (ref 70–99)
POTASSIUM: 4 meq/L (ref 3.5–5.1)
Sodium: 138 mEq/L (ref 135–145)
Total Bilirubin: 0.8 mg/dL (ref 0.2–1.2)
Total Protein: 7.1 g/dL (ref 6.0–8.3)

## 2014-05-03 LAB — CBC WITH DIFFERENTIAL/PLATELET
Basophils Absolute: 0 10*3/uL (ref 0.0–0.1)
Basophils Relative: 0.6 % (ref 0.0–3.0)
Eosinophils Absolute: 0.1 10*3/uL (ref 0.0–0.7)
Eosinophils Relative: 1 % (ref 0.0–5.0)
HEMATOCRIT: 44.4 % (ref 39.0–52.0)
HEMOGLOBIN: 15.2 g/dL (ref 13.0–17.0)
Lymphocytes Relative: 30.5 % (ref 12.0–46.0)
Lymphs Abs: 2 10*3/uL (ref 0.7–4.0)
MCHC: 34.2 g/dL (ref 30.0–36.0)
MCV: 94.3 fl (ref 78.0–100.0)
Monocytes Absolute: 0.6 10*3/uL (ref 0.1–1.0)
Monocytes Relative: 9.8 % (ref 3.0–12.0)
Neutro Abs: 3.8 10*3/uL (ref 1.4–7.7)
Neutrophils Relative %: 58.1 % (ref 43.0–77.0)
Platelets: 230 10*3/uL (ref 150.0–400.0)
RBC: 4.7 Mil/uL (ref 4.22–5.81)
RDW: 12.2 % (ref 11.5–15.5)
WBC: 6.5 10*3/uL (ref 4.0–10.5)

## 2014-05-03 LAB — PSA: PSA: 5.4 ng/mL — AB (ref 0.10–4.00)

## 2014-05-03 LAB — TESTOSTERONE: TESTOSTERONE: 139.55 ng/dL — AB (ref 300.00–890.00)

## 2014-05-03 LAB — LDL CHOLESTEROL, DIRECT: Direct LDL: 135 mg/dL

## 2014-05-03 LAB — TSH: TSH: 3.34 u[IU]/mL (ref 0.35–4.50)

## 2014-05-03 MED ORDER — METOPROLOL TARTRATE 100 MG PO TABS: 100.0000 mg | ORAL_TABLET | Freq: Two times a day (BID) | ORAL | Status: DC

## 2014-05-03 MED ORDER — AMLODIPINE BESYLATE 10 MG PO TABS: 10.0000 mg | ORAL_TABLET | Freq: Every day | ORAL | Status: DC

## 2014-05-03 MED ORDER — TRAMADOL HCL 50 MG PO TABS: 50.0000 mg | ORAL_TABLET | Freq: Four times a day (QID) | ORAL | Status: DC | PRN
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
## 2014-05-03 NOTE — Patient Instructions (Signed)
Limit your sodium (Salt) intake  Health Maintenance A healthy lifestyle and preventative care can promote health and wellness.  Maintain regular health, dental, and eye exams.  Eat a healthy diet. Foods like vegetables, fruits, whole grains, low-fat dairy products, and lean protein foods contain the nutrients you need and are low in calories. Decrease your intake of foods high in solid fats, added sugars, and salt. Get information about a proper diet from your health care provider, if necessary.  Regular physical exercise is one of the most important things you can do for your health. Most adults should get at least 150 minutes of moderate-intensity exercise (any activity that increases your heart rate and causes you to sweat) each week. In addition, most adults need muscle-strengthening exercises on 2 or more days a week.   Maintain a healthy weight. The body mass index (BMI) is a screening tool to identify possible weight problems. It provides an estimate of body fat based on height and weight. Your health care provider can find your BMI and can help you achieve or maintain a healthy weight. For males 20 years and older:  A BMI below 18.5 is considered underweight.  A BMI of 18.5 to 24.9 is normal.  A BMI of 25 to 29.9 is considered overweight.  A BMI of 30 and above is considered obese.  Maintain normal blood lipids and cholesterol by exercising and minimizing your intake of saturated fat. Eat a balanced diet with plenty of fruits and vegetables. Blood tests for lipids and cholesterol should begin at age 20 and be repeated every 5 years. If your lipid or cholesterol levels are high, you are over age 50, or you are at high risk for heart disease, you may need your cholesterol levels checked more frequently.Ongoing high lipid and cholesterol levels should be treated with medicines if diet and exercise are not working.  If you smoke, find out from your health care provider how to quit. If  you do not use tobacco, do not start.  Lung cancer screening is recommended for adults aged 55-80 years who are at high risk for developing lung cancer because of a history of smoking. A yearly low-dose CT scan of the lungs is recommended for people who have at least a 30-pack-year history of smoking and are current smokers or have quit within the past 15 years. A pack year of smoking is smoking an average of 1 pack of cigarettes a day for 1 year (for example, a 30-pack-year history of smoking could mean smoking 1 pack a day for 30 years or 2 packs a day for 15 years). Yearly screening should continue until the smoker has stopped smoking for at least 15 years. Yearly screening should be stopped for people who develop a health problem that would prevent them from having lung cancer treatment.  If you choose to drink alcohol, do not have more than 2 drinks per day. One drink is considered to be 12 oz (360 mL) of beer, 5 oz (150 mL) of wine, or 1.5 oz (45 mL) of liquor.  Avoid the use of street drugs. Do not share needles with anyone. Ask for help if you need support or instructions about stopping the use of drugs.  High blood pressure causes heart disease and increases the risk of stroke. Blood pressure should be checked at least every 1-2 years. Ongoing high blood pressure should be treated with medicines if weight loss and exercise are not effective.  If you are 45-79 years old,   ask your health care provider if you should take aspirin to prevent heart disease.  Diabetes screening involves taking a blood sample to check your fasting blood sugar level. This should be done once every 3 years after age 45 if you are at a normal weight and without risk factors for diabetes. Testing should be considered at a younger age or be carried out more frequently if you are overweight and have at least 1 risk factor for diabetes.  Colorectal cancer can be detected and often prevented. Most routine colorectal cancer  screening begins at the age of 50 and continues through age 75. However, your health care provider may recommend screening at an earlier age if you have risk factors for colon cancer. On a yearly basis, your health care provider may provide home test kits to check for hidden blood in the stool. A small camera at the end of a tube may be used to directly examine the colon (sigmoidoscopy or colonoscopy) to detect the earliest forms of colorectal cancer. Talk to your health care provider about this at age 50 when routine screening begins. A direct exam of the colon should be repeated every 5-10 years through age 75, unless early forms of precancerous polyps or small growths are found.  People who are at an increased risk for hepatitis B should be screened for this virus. You are considered at high risk for hepatitis B if:  You were born in a country where hepatitis B occurs often. Talk with your health care provider about which countries are considered high risk.  Your parents were born in a high-risk country and you have not received a shot to protect against hepatitis B (hepatitis B vaccine).  You have HIV or AIDS.  You use needles to inject street drugs.  You live with, or have sex with, someone who has hepatitis B.  You are a man who has sex with other men (MSM).  You get hemodialysis treatment.  You take certain medicines for conditions like cancer, organ transplantation, and autoimmune conditions.  Hepatitis C blood testing is recommended for all people born from 1945 through 1965 and any individual with known risk factors for hepatitis C.  Healthy men should no longer receive prostate-specific antigen (PSA) blood tests as part of routine cancer screening. Talk to your health care provider about prostate cancer screening.  Testicular cancer screening is not recommended for adolescents or adult males who have no symptoms. Screening includes self-exam, a health care provider exam, and other  screening tests. Consult with your health care provider about any symptoms you have or any concerns you have about testicular cancer.  Practice safe sex. Use condoms and avoid high-risk sexual practices to reduce the spread of sexually transmitted infections (STIs).  You should be screened for STIs, including gonorrhea and chlamydia if:  You are sexually active and are younger than 24 years.  You are older than 24 years, and your health care provider tells you that you are at risk for this type of infection.  Your sexual activity has changed since you were last screened, and you are at an increased risk for chlamydia or gonorrhea. Ask your health care provider if you are at risk.  If you are at risk of being infected with HIV, it is recommended that you take a prescription medicine daily to prevent HIV infection. This is called pre-exposure prophylaxis (PrEP). You are considered at risk if:  You are a man who has sex with other men (MSM).    You are a heterosexual man who is sexually active with multiple partners.  You take drugs by injection.  You are sexually active with a partner who has HIV.  Talk with your health care provider about whether you are at high risk of being infected with HIV. If you choose to begin PrEP, you should first be tested for HIV. You should then be tested every 3 months for as long as you are taking PrEP.  Use sunscreen. Apply sunscreen liberally and repeatedly throughout the day. You should seek shade when your shadow is shorter than you. Protect yourself by wearing long sleeves, pants, a wide-brimmed hat, and sunglasses year round whenever you are outdoors.  Tell your health care provider of new moles or changes in moles, especially if there is a change in shape or color. Also, tell your health care provider if a mole is larger than the size of a pencil eraser.  A one-time screening for abdominal aortic aneurysm (AAA) and surgical repair of large AAAs by  ultrasound is recommended for men aged 65-75 years who are current or former smokers.  Stay current with your vaccines (immunizations). Document Released: 10/02/2007 Document Revised: 04/10/2013 Document Reviewed: 08/31/2010 [employer] Patient Information 2015 [employer]. This information is not intended to replace advice given to you by your health care provider. Make sure you discuss any questions you have with your health care provider.  
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
## 2014-05-12 IMAGING — CR DG CHEST 2V
2 series · 2 of 2 positions shown · non-contrast
Comparison: None.

CLINICAL DATA: Preoperative evaluation.  Recent bronchitis, cough

CHEST - 2 VIEW

[w chest pa]
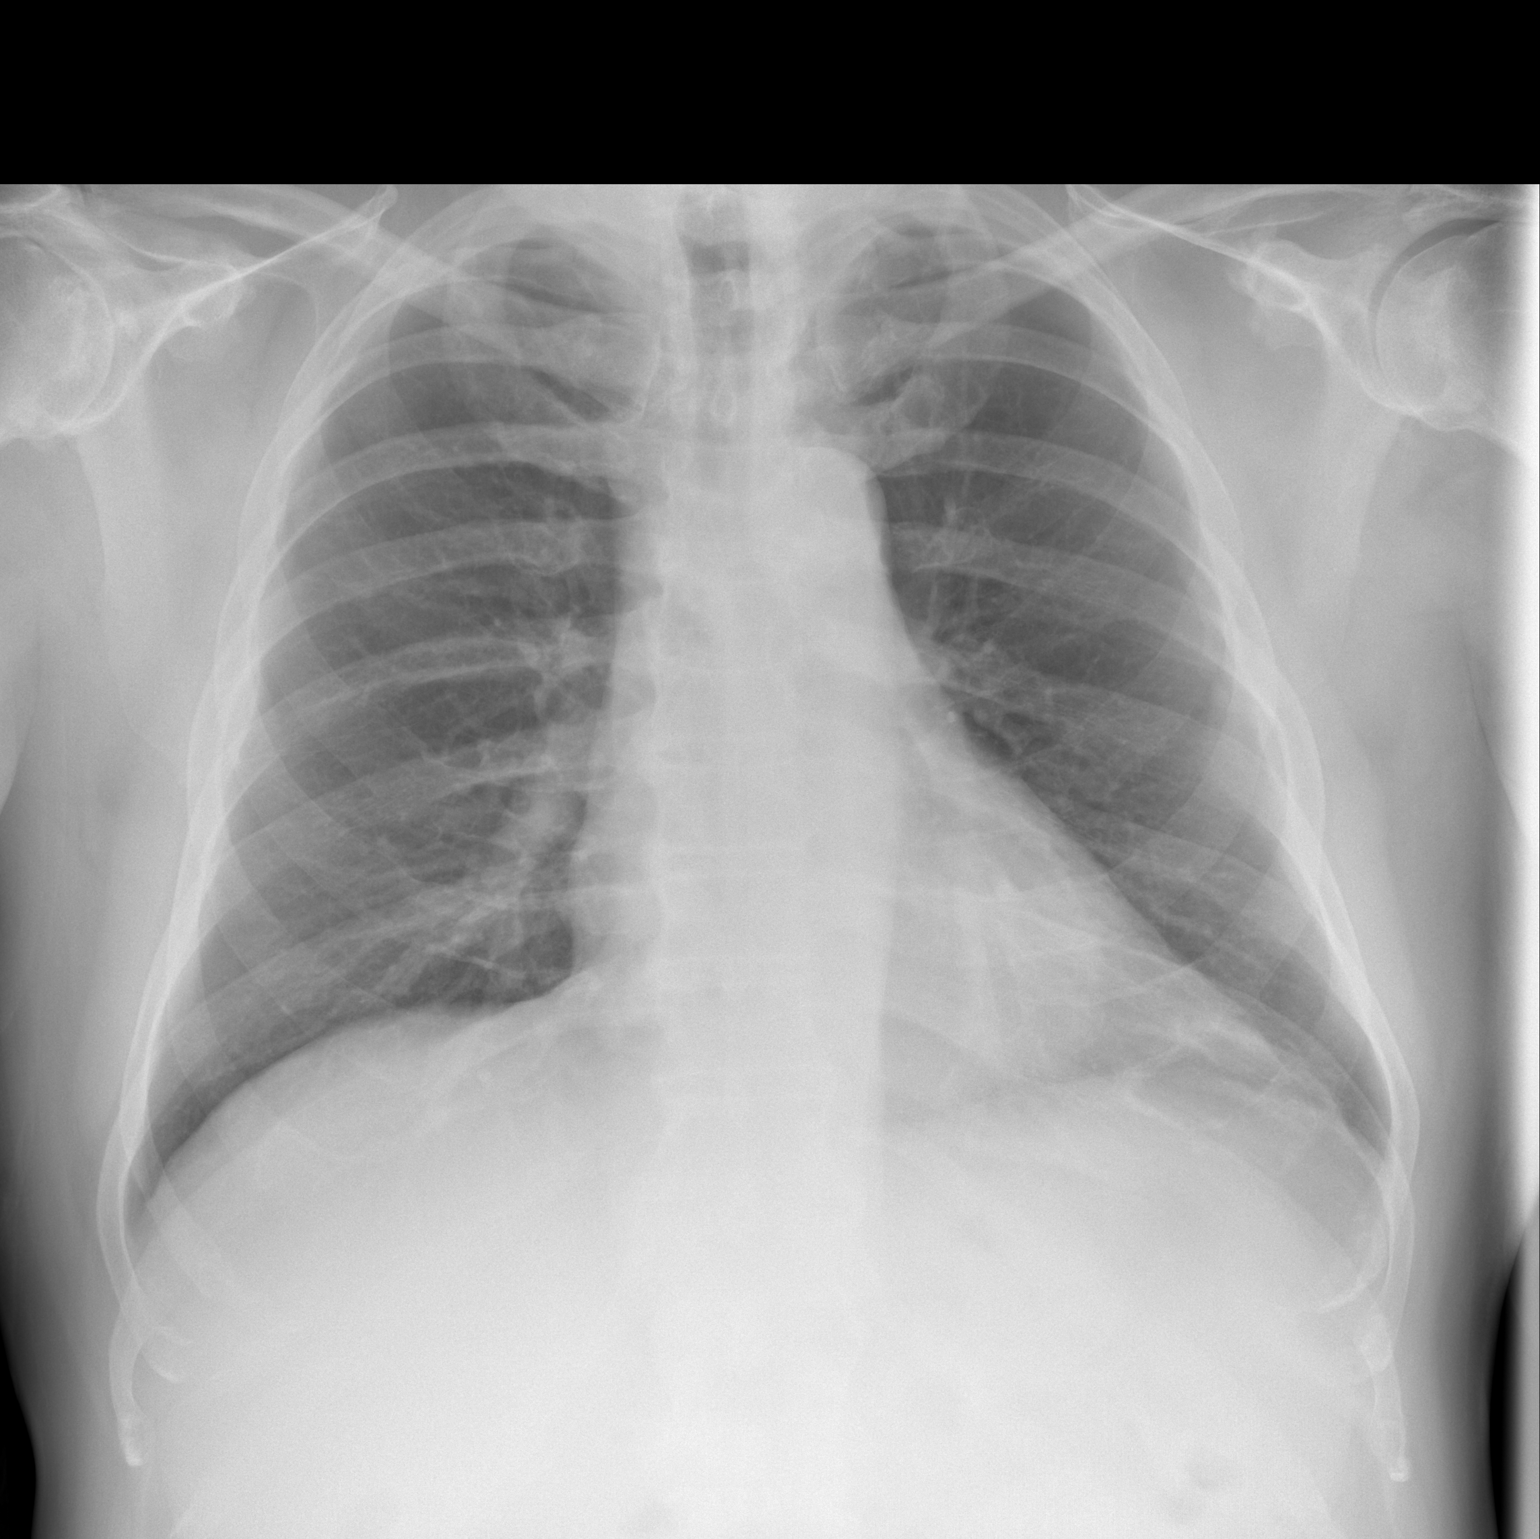

[w chest lat]
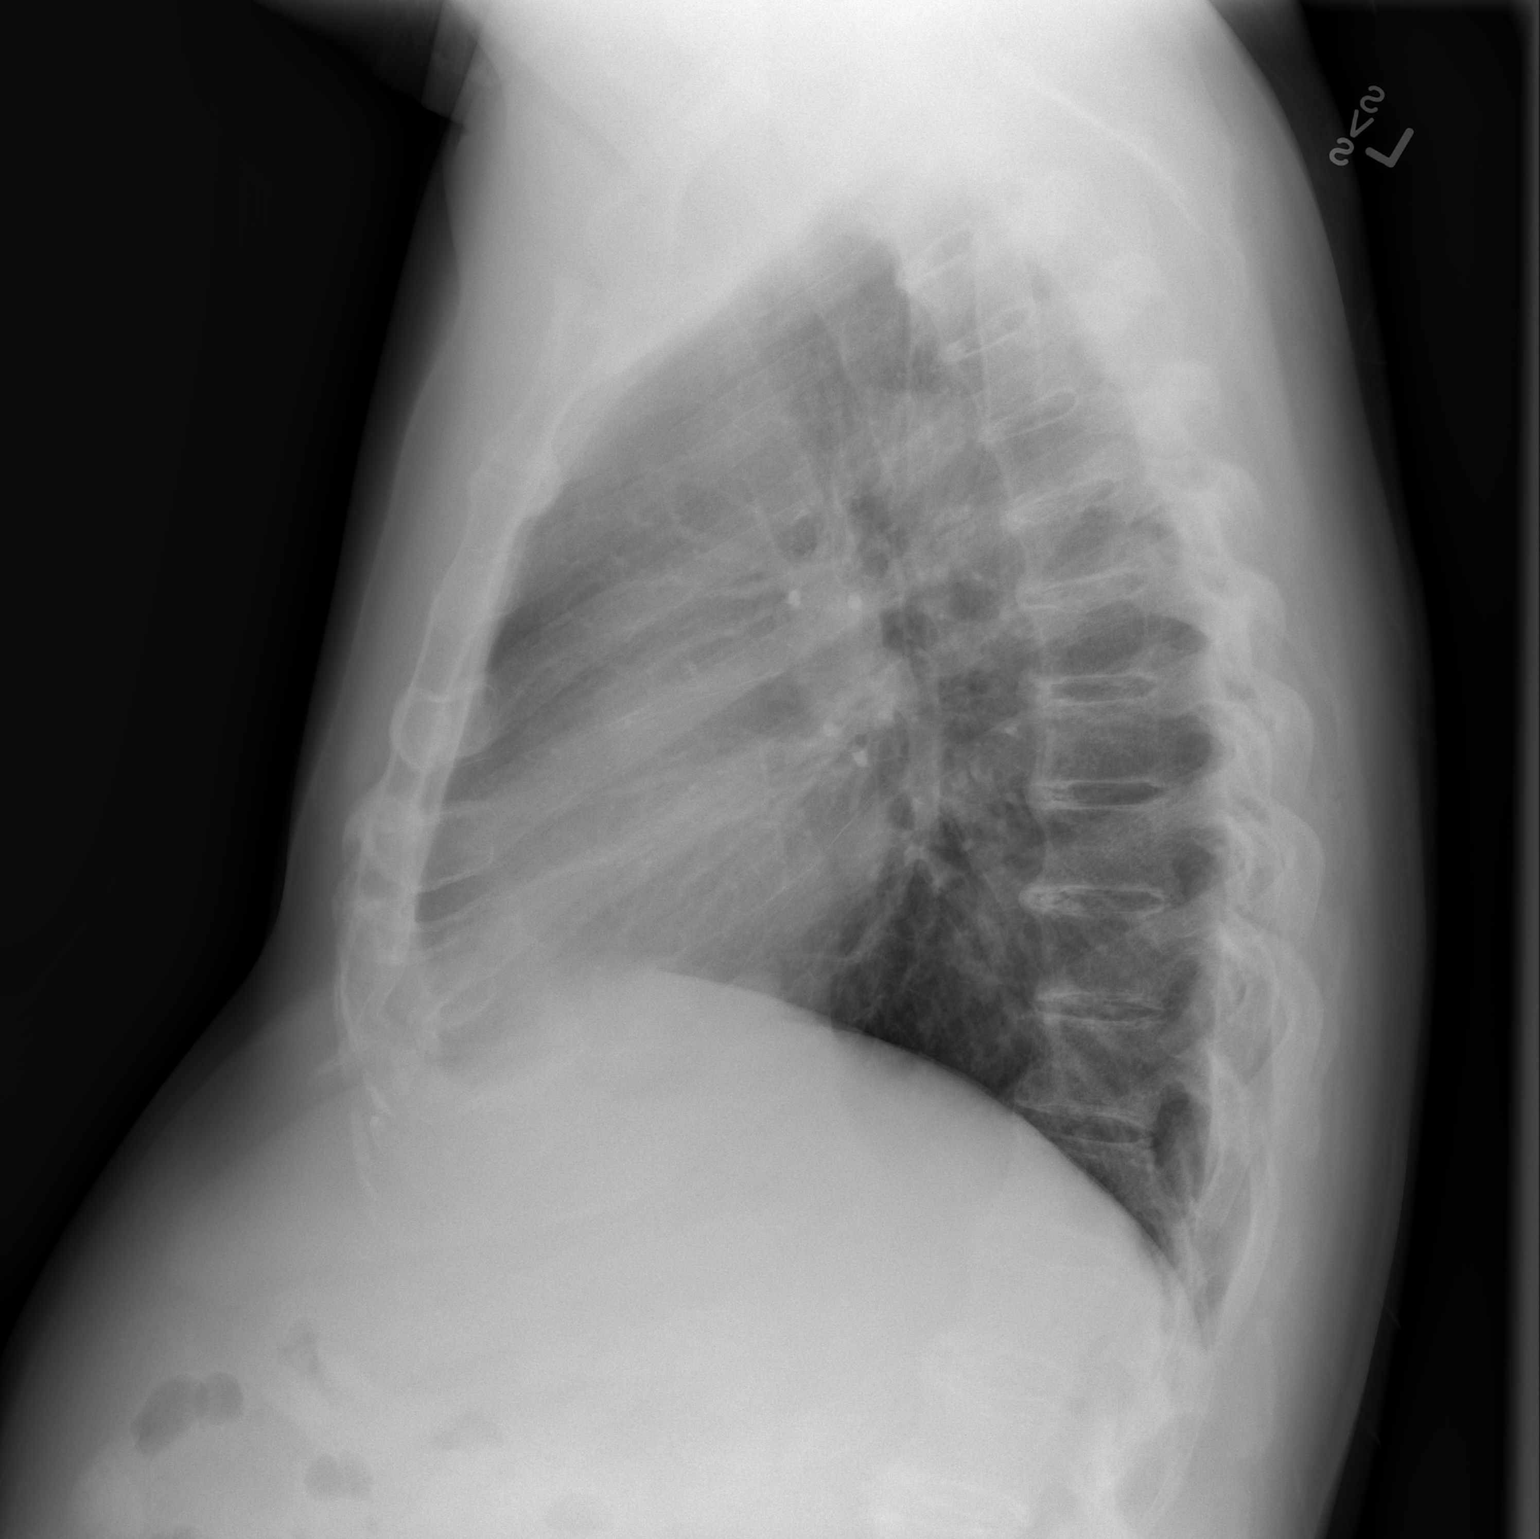

[2 of 2 positions shown; findings below may reference images not displayed]

FINDINGS: Minimal streaky left base opacity compatible with
atelectasis or scarring.  No focal airspace process or pneumonia.
Negative for collapse, consolidation, edema, effusion, or
pneumothorax.  Normal heart size and vascularity.  Trachea is
midline.  Degenerative changes of the spine and shoulders.
IMPRESSION: Left base atelectasis versus scarring.  No acute chest process.

## 2014-06-29 ENCOUNTER — Other Ambulatory Visit: Payer: Self-pay | Admitting: Internal Medicine

## 2014-07-07 IMAGING — CR DG PORTABLE PELVIS
1 series · 1 of 1 positions shown · non-contrast
Comparison: None

CLINICAL DATA: Left total hip replacement.

EXAM:
PORTABLE PELVIS

[AP]
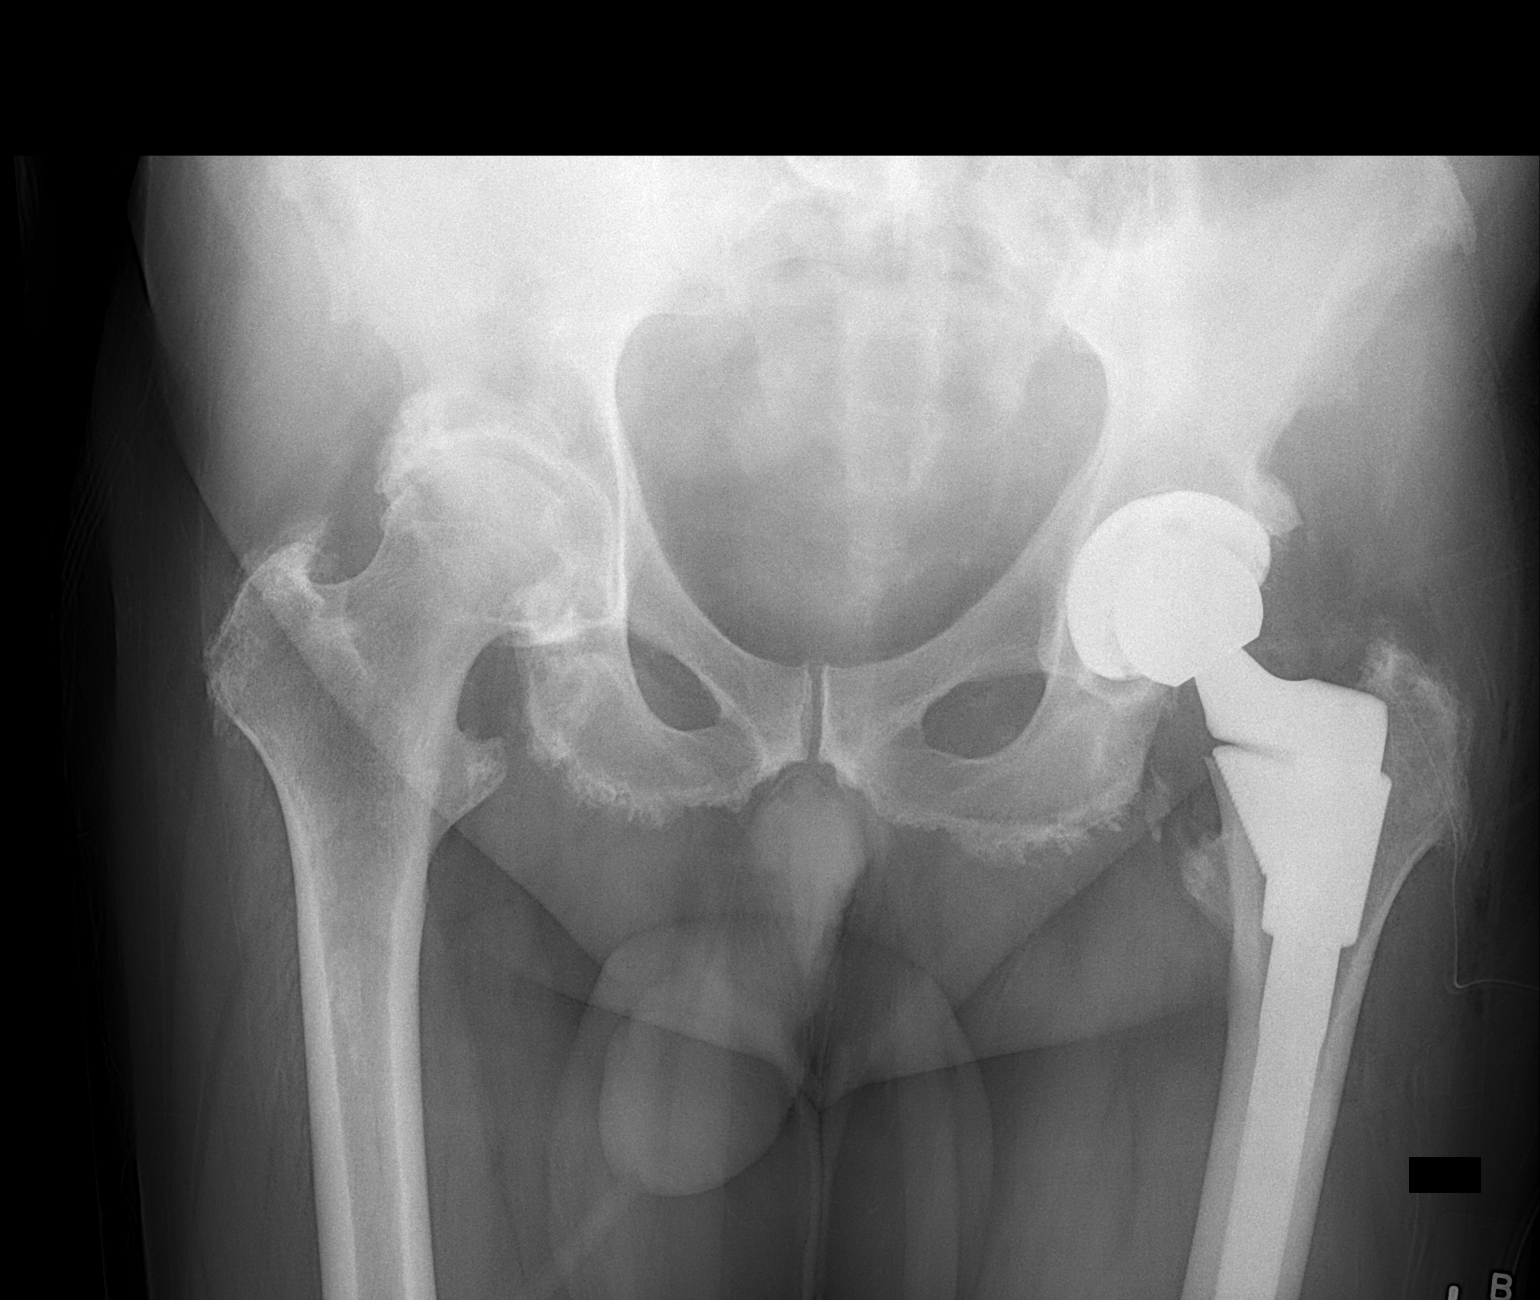

[1 of 1 positions shown; findings below may reference images not displayed]

FINDINGS: Changes of left total hip replacement. Normal AP alignment. No
hardware or bony complicating feature. Moderate degenerative changes
in the right hip. Extensive enthesopathic changes throughout the
pelvis.
IMPRESSION: Left hip replacement without visible complicating feature.

## 2014-07-19 ENCOUNTER — Encounter: Payer: Self-pay | Admitting: Internal Medicine

## 2014-07-28 ENCOUNTER — Other Ambulatory Visit: Payer: Self-pay | Admitting: Internal Medicine

## 2014-08-22 ENCOUNTER — Other Ambulatory Visit: Payer: Self-pay | Admitting: Internal Medicine

## 2014-08-23 DIAGNOSIS — Z471 Aftercare following joint replacement surgery: Secondary | ICD-10-CM | POA: Diagnosis not present

## 2014-08-23 DIAGNOSIS — Z96642 Presence of left artificial hip joint: Secondary | ICD-10-CM | POA: Diagnosis not present

## 2014-09-26 ENCOUNTER — Other Ambulatory Visit: Payer: Self-pay | Admitting: Internal Medicine

## 2014-10-12 ENCOUNTER — Emergency Department (HOSPITAL_COMMUNITY): Payer: Medicare Other

## 2014-10-12 ENCOUNTER — Encounter (HOSPITAL_COMMUNITY): Payer: Self-pay | Admitting: Emergency Medicine

## 2014-10-12 ENCOUNTER — Emergency Department (HOSPITAL_COMMUNITY)
Admission: EM | Admit: 2014-10-12 | Discharge: 2014-10-12 | Disposition: A | Discharge status: Home / Self Care | Payer: Medicare Other | Attending: Emergency Medicine | Admitting: Emergency Medicine

## 2014-10-12 DIAGNOSIS — R109 Unspecified abdominal pain: Secondary | ICD-10-CM

## 2014-10-12 DIAGNOSIS — Z7289 Other problems related to lifestyle: Secondary | ICD-10-CM

## 2014-10-12 DIAGNOSIS — Z8739 Personal history of other diseases of the musculoskeletal system and connective tissue: Secondary | ICD-10-CM | POA: Insufficient documentation

## 2014-10-12 DIAGNOSIS — Z8673 Personal history of transient ischemic attack (TIA), and cerebral infarction without residual deficits: Secondary | ICD-10-CM | POA: Insufficient documentation

## 2014-10-12 DIAGNOSIS — Z87891 Personal history of nicotine dependence: Secondary | ICD-10-CM | POA: Diagnosis not present

## 2014-10-12 DIAGNOSIS — Z8719 Personal history of other diseases of the digestive system: Secondary | ICD-10-CM | POA: Insufficient documentation

## 2014-10-12 DIAGNOSIS — F329 Major depressive disorder, single episode, unspecified: Secondary | ICD-10-CM | POA: Insufficient documentation

## 2014-10-12 DIAGNOSIS — Z8701 Personal history of pneumonia (recurrent): Secondary | ICD-10-CM | POA: Diagnosis not present

## 2014-10-12 DIAGNOSIS — Z8619 Personal history of other infectious and parasitic diseases: Secondary | ICD-10-CM | POA: Diagnosis not present

## 2014-10-12 DIAGNOSIS — Z8669 Personal history of other diseases of the nervous system and sense organs: Secondary | ICD-10-CM | POA: Insufficient documentation

## 2014-10-12 DIAGNOSIS — F1099 Alcohol use, unspecified with unspecified alcohol-induced disorder: Secondary | ICD-10-CM | POA: Insufficient documentation

## 2014-10-12 DIAGNOSIS — Z789 Other specified health status: Secondary | ICD-10-CM

## 2014-10-12 DIAGNOSIS — Z87828 Personal history of other (healed) physical injury and trauma: Secondary | ICD-10-CM | POA: Insufficient documentation

## 2014-10-12 DIAGNOSIS — Z7982 Long term (current) use of aspirin: Secondary | ICD-10-CM | POA: Insufficient documentation

## 2014-10-12 DIAGNOSIS — R740 Nonspecific elevation of levels of transaminase and lactic acid dehydrogenase [LDH]: Secondary | ICD-10-CM | POA: Insufficient documentation

## 2014-10-12 DIAGNOSIS — Z7951 Long term (current) use of inhaled steroids: Secondary | ICD-10-CM | POA: Insufficient documentation

## 2014-10-12 DIAGNOSIS — I10 Essential (primary) hypertension: Secondary | ICD-10-CM | POA: Diagnosis not present

## 2014-10-12 DIAGNOSIS — R197 Diarrhea, unspecified: Secondary | ICD-10-CM

## 2014-10-12 DIAGNOSIS — K5900 Constipation, unspecified: Secondary | ICD-10-CM | POA: Diagnosis not present

## 2014-10-12 DIAGNOSIS — Z86010 Personal history of colonic polyps: Secondary | ICD-10-CM | POA: Insufficient documentation

## 2014-10-12 DIAGNOSIS — R7401 Elevation of levels of liver transaminase levels: Secondary | ICD-10-CM

## 2014-10-12 DIAGNOSIS — R1012 Left upper quadrant pain: Secondary | ICD-10-CM | POA: Diagnosis not present

## 2014-10-12 DIAGNOSIS — J45909 Unspecified asthma, uncomplicated: Secondary | ICD-10-CM | POA: Diagnosis not present

## 2014-10-12 DIAGNOSIS — K5732 Diverticulitis of large intestine without perforation or abscess without bleeding: Secondary | ICD-10-CM | POA: Insufficient documentation

## 2014-10-12 DIAGNOSIS — M1990 Unspecified osteoarthritis, unspecified site: Secondary | ICD-10-CM | POA: Diagnosis not present

## 2014-10-12 DIAGNOSIS — R1031 Right lower quadrant pain: Secondary | ICD-10-CM | POA: Diagnosis not present

## 2014-10-12 DIAGNOSIS — R1032 Left lower quadrant pain: Secondary | ICD-10-CM | POA: Diagnosis not present

## 2014-10-12 LAB — URINALYSIS, ROUTINE W REFLEX MICROSCOPIC
Bilirubin Urine: NEGATIVE
Glucose, UA: NEGATIVE mg/dL
Ketones, ur: NEGATIVE mg/dL
Leukocytes, UA: NEGATIVE
Nitrite: NEGATIVE
Protein, ur: NEGATIVE mg/dL
Specific Gravity, Urine: 1.004 — ABNORMAL LOW (ref 1.005–1.030)
Urobilinogen, UA: 0.2 mg/dL (ref 0.0–1.0)
pH: 6 (ref 5.0–8.0)

## 2014-10-12 LAB — CBC WITH DIFFERENTIAL/PLATELET
Basophils Absolute: 0 10*3/uL (ref 0.0–0.1)
Basophils Relative: 0 % (ref 0–1)
EOS ABS: 0.1 10*3/uL (ref 0.0–0.7)
EOS PCT: 1 % (ref 0–5)
HEMATOCRIT: 43.5 % (ref 39.0–52.0)
HEMOGLOBIN: 15 g/dL (ref 13.0–17.0)
LYMPHS PCT: 23 % (ref 12–46)
Lymphs Abs: 1.5 10*3/uL (ref 0.7–4.0)
MCH: 31.8 pg (ref 26.0–34.0)
MCHC: 34.5 g/dL (ref 30.0–36.0)
MCV: 92.2 fL (ref 78.0–100.0)
MONOS PCT: 10 % (ref 3–12)
Monocytes Absolute: 0.6 10*3/uL (ref 0.1–1.0)
NEUTROS ABS: 4.4 10*3/uL (ref 1.7–7.7)
Neutrophils Relative %: 66 % (ref 43–77)
Platelets: 268 10*3/uL (ref 150–400)
RBC: 4.72 MIL/uL (ref 4.22–5.81)
RDW: 12.3 % (ref 11.5–15.5)
WBC: 6.6 10*3/uL (ref 4.0–10.5)

## 2014-10-12 LAB — URINE MICROSCOPIC-ADD ON

## 2014-10-12 LAB — COMPREHENSIVE METABOLIC PANEL
ALBUMIN: 4.2 g/dL (ref 3.5–5.0)
ALK PHOS: 19 U/L — AB (ref 38–126)
ALT: 69 U/L — AB (ref 17–63)
ANION GAP: 13 (ref 5–15)
AST: 56 U/L — ABNORMAL HIGH (ref 15–41)
BILIRUBIN TOTAL: 0.9 mg/dL (ref 0.3–1.2)
BUN: 9 mg/dL (ref 6–20)
CHLORIDE: 102 mmol/L (ref 101–111)
CO2: 23 mmol/L (ref 22–32)
CREATININE: 0.98 mg/dL (ref 0.61–1.24)
Calcium: 9.1 mg/dL (ref 8.9–10.3)
GFR calc non Af Amer: >60 mL/min (ref >60)
Glucose, Bld: 115 mg/dL — ABNORMAL HIGH (ref 65–99)
POTASSIUM: 3.7 mmol/L (ref 3.5–5.1)
SODIUM: 138 mmol/L (ref 135–145)
TOTAL PROTEIN: 7.4 g/dL (ref 6.5–8.1)

## 2014-10-12 LAB — POC OCCULT BLOOD, ED: FECAL OCCULT BLD: NEGATIVE

## 2014-10-12 LAB — LIPASE, BLOOD: Lipase: 27 U/L (ref 22–51)

## 2014-10-12 MED ORDER — CIPROFLOXACIN HCL 500 MG PO TABS: 500.0000 mg | ORAL_TABLET | Freq: Two times a day (BID) | ORAL | Status: DC

## 2014-10-12 MED ORDER — METRONIDAZOLE 500 MG PO TABS
500.0000 mg | ORAL_TABLET | Freq: Three times a day (TID) | ORAL | Status: DC
Start: 2014-10-12 — End: 2014-10-12

## 2014-10-12 MED ORDER — DIPHENHYDRAMINE HCL 50 MG/ML IJ SOLN
25.0000 mg | Freq: Once | INTRAMUSCULAR | Status: AC
  Administered 2014-10-12: 25 mg via INTRAVENOUS
  Filled 2014-10-12: qty 1

## 2014-10-12 MED ORDER — IOHEXOL 300 MG/ML  SOLN
50.0000 mL | Freq: Once | INTRAMUSCULAR | Status: AC | PRN
  Administered 2014-10-12: 50 mL via ORAL

## 2014-10-12 MED ORDER — METRONIDAZOLE 500 MG PO TABS
500.0000 mg | ORAL_TABLET | Freq: Once | ORAL | Status: AC
  Administered 2014-10-12: 500 mg via ORAL
  Filled 2014-10-12: qty 1

## 2014-10-12 MED ORDER — CIPROFLOXACIN HCL 500 MG PO TABS
500.0000 mg | ORAL_TABLET | Freq: Once | ORAL | Status: AC
  Administered 2014-10-12: 500 mg via ORAL
  Filled 2014-10-12: qty 1

## 2014-10-12 MED ORDER — IOHEXOL 300 MG/ML  SOLN: 100.0000 mL | Freq: Once | INTRAMUSCULAR | Status: DC | PRN

## 2014-10-12 MED ORDER — ONDANSETRON HCL 8 MG PO TABS: 8.0000 mg | ORAL_TABLET | Freq: Three times a day (TID) | ORAL | Status: DC | PRN

## 2014-10-12 MED ORDER — LORAZEPAM 2 MG/ML IJ SOLN
1.0000 mg | Freq: Once | INTRAMUSCULAR | Status: AC
  Administered 2014-10-12: 1 mg via INTRAVENOUS
  Filled 2014-10-12: qty 1

## 2014-10-12 MED ORDER — SODIUM CHLORIDE 0.9 % IV SOLN
Freq: Once | INTRAVENOUS | Status: AC
  Administered 2014-10-12: 17:00:00 via INTRAVENOUS

## 2014-10-12 MED ORDER — METRONIDAZOLE 500 MG PO TABS: 500.0000 mg | ORAL_TABLET | Freq: Three times a day (TID) | ORAL | Status: DC

## 2014-10-12 NOTE — ED Notes (Signed)
MD at bedside. 
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
## 2014-10-12 NOTE — ED Notes (Signed)
Pt reports bloating and constipation for 5 days. Pt denies nausea or vomiting. Pt has tried several over the counter meds with little relief. Able to move "some watery stuff" but not able to have a complete bowel movement. Pt ambulatory.

## 2014-10-12 NOTE — ED Notes (Signed)
Dr Ashok Cordia in room with patient.

## 2014-10-12 NOTE — ED Notes (Signed)
Called to patients room.  Pts entire body is shivering.  He has no other complaints, but feels he is having a reaction to the contrast.  Pt is A&O, no other complaints, he has no complaints of breathing difficulty.  BP is elevated.  [place], [place] notified and is bedside

## 2014-10-12 NOTE — Discharge Instructions (Signed)
Your pain and diarrhea are caused from diverticulitis. Increase the fiber in your diet. Use zofran as prescribed, as needed for nausea. Stay well hydrated with small sips of fluids throughout the day. Follow a BRAT (banana-rice-applesauce-toast) diet as described below for the next 24-48 hours. The 'BRAT' diet is suggested, then progress to diet as tolerated as symptoms abate. Use your home pain medications for any pain. Take antibiotics as directed and DO NOT CONSUME ALCOHOL WHILE TAKING THESE. Call your doctor if bloody stools, persistent diarrhea, vomiting, fever or worsening abdominal pain. Follow up with your doctor in 5-7 days for recheck.  Return to ER for changing or worsening of symptoms.  Food Choices to Help Relieve Diarrhea When you have diarrhea, the foods you eat and your eating habits are very important. Choosing the right foods and drinks can help relieve diarrhea. Also, because diarrhea can last up to 7 days, you need to replace lost fluids and electrolytes (such as sodium, potassium, and chloride) in order to help prevent dehydration.  WHAT GENERAL GUIDELINES DO I NEED TO FOLLOW?  Slowly drink 1 cup (8 oz) of fluid for each episode of diarrhea. If you are getting enough fluid, your urine will be clear or pale yellow.  Eat starchy foods. Some good choices include white rice, white toast, pasta, low-fiber cereal, baked potatoes (without the skin), saltine crackers, and bagels.  Avoid large servings of any cooked vegetables.  Limit fruit to two servings per day. A serving is  cup or 1 small piece.  Choose foods with less than 2 g of fiber per serving.  Limit fats to less than 8 tsp (38 g) per day.  Avoid fried foods.  Eat foods that have probiotics in them. Probiotics can be found in certain dairy products.  Avoid foods and beverages that may increase the speed at which food moves through the stomach and intestines (gastrointestinal tract). Things to avoid include:  High-fiber  foods, such as dried fruit, raw fruits and vegetables, nuts, seeds, and whole grain foods.  Spicy foods and high-fat foods.  Foods and beverages sweetened with high-fructose corn syrup, honey, or sugar alcohols such as xylitol, sorbitol, and mannitol. WHAT FOODS ARE RECOMMENDED? Grains White rice. White, [place], or pita breads (fresh or toasted), including plain rolls, buns, or bagels. White pasta. Saltine, soda, or graham crackers. Pretzels. Low-fiber cereal. Cooked cereals made with water (such as cornmeal, farina, or cream cereals). Plain muffins. Matzo. Melba toast. Zwieback.  Vegetables Potatoes (without the skin). Strained tomato and vegetable juices. Most well-cooked and canned vegetables without seeds. Tender lettuce. Fruits Cooked or canned applesauce, apricots, cherries, fruit cocktail, grapefruit, peaches, pears, or plums. Fresh bananas, apples without skin, cherries, grapes, cantaloupe, grapefruit, peaches, oranges, or plums.  Meat and Other Protein Products Baked or boiled chicken. Eggs. Tofu. Fish. Seafood. Smooth peanut butter. Ground or well-cooked tender beef, ham, veal, lamb, pork, or poultry.  Dairy Plain yogurt, kefir, and unsweetened liquid yogurt. Lactose-free milk, buttermilk, or soy milk. Plain hard cheese. Beverages Sport drinks. Clear broths. Diluted fruit juices (except prune). Regular, caffeine-free sodas such as ginger ale. Water. Decaffeinated teas. Oral rehydration solutions. Sugar-free beverages not sweetened with sugar alcohols. Other Bouillon, broth, or soups made from recommended foods.  The items listed above may not be a complete list of recommended foods or beverages. Contact your dietitian for more options. WHAT FOODS ARE NOT RECOMMENDED? Grains Whole grain, whole wheat, bran, or rye breads, rolls, pastas, crackers, and cereals. Wild or brown rice. Cereals  that contain more than 2 g of fiber per serving. Corn tortillas or taco shells. Cooked or dry  oatmeal. Granola. Popcorn. Vegetables Raw vegetables. Cabbage, broccoli, Brussels sprouts, artichokes, baked beans, beet greens, corn, kale, legumes, peas, sweet potatoes, and yams. Potato skins. Cooked spinach and cabbage. Fruits Dried fruit, including raisins and dates. Raw fruits. Stewed or dried prunes. Fresh apples with skin, apricots, mangoes, pears, raspberries, and strawberries.  Meat and Other Protein Products Chunky peanut butter. Nuts and seeds. Beans and lentils. Berniece Salines.  Dairy High-fat cheeses. Milk, chocolate milk, and beverages made with milk, such as milk shakes. Cream. Ice cream. Sweets and Desserts Sweet rolls, doughnuts, and sweet breads. Pancakes and waffles. Fats and Oils Butter. Cream sauces. Margarine. Salad oils. Plain salad dressings. Olives. Avocados.  Beverages Caffeinated beverages (such as coffee, tea, soda, or energy drinks). Alcoholic beverages. Fruit juices with pulp. Prune juice. Soft drinks sweetened with high-fructose corn syrup or sugar alcohols. Other Coconut. Hot sauce. Chili powder. Mayonnaise. Gravy. Cream-based or milk-based soups.  The items listed above may not be a complete list of foods and beverages to avoid. Contact your dietitian for more information. WHAT SHOULD I DO IF I BECOME DEHYDRATED? Diarrhea can sometimes lead to dehydration. Signs of dehydration include dark urine and dry mouth and skin. If you think you are dehydrated, you should rehydrate with an oral rehydration solution. These solutions can be purchased at pharmacies, retail stores, or online.  Drink -1 cup (120-240 mL) of oral rehydration solution each time you have an episode of diarrhea. If drinking this amount makes your diarrhea worse, try drinking smaller amounts more often. For example, drink 1-3 tsp (5-15 mL) every 5-10 minutes.  A general rule for staying hydrated is to drink 1-2 L of fluid per day. Talk to your health care provider about the specific amount you should be  drinking each day. Drink enough fluids to keep your urine clear or pale yellow. Document Released: 06/26/2003 Document Revised: 04/10/2013 Document Reviewed: 02/26/2013 [facility] Patient Information 2015 [place], [place]. This information is not intended to replace advice given to you by your health care provider. Make sure you discuss any questions you have with your health care provider.   Abdominal Pain Many things can cause abdominal pain. Usually, abdominal pain is not caused by a disease and will improve without treatment. It can often be observed and treated at home. Your health care provider will do a physical exam and possibly order blood tests and X-rays to help determine the seriousness of your pain. However, in many cases, more time must pass before a clear cause of the pain can be found. Before that point, your health care provider may not know if you need more testing or further treatment. HOME CARE INSTRUCTIONS  Monitor your abdominal pain for any changes. The following actions may help to alleviate any discomfort you are experiencing:  Only take over-the-counter or prescription medicines as directed by your health care provider.  Do not take laxatives unless directed to do so by your health care provider.  Try a clear liquid diet (broth, tea, or water) as directed by your health care provider. Slowly move to a bland diet as tolerated. SEEK MEDICAL CARE IF:  You have unexplained abdominal pain.  You have abdominal pain associated with nausea or diarrhea.  You have pain when you urinate or have a bowel movement.  You experience abdominal pain that wakes you in the night.  You have abdominal pain that is worsened or improved  by eating food.  You have abdominal pain that is worsened with eating fatty foods.  You have a fever. SEEK IMMEDIATE MEDICAL CARE IF:   Your pain does not go away within 2 hours.  You keep throwing up (vomiting).  Your pain is felt only in portions  of the abdomen, such as the right side or the left lower portion of the abdomen.  You pass bloody or black tarry stools. MAKE SURE YOU:  Understand these instructions.   Will watch your condition.   Will get help right away if you are not doing well or get worse.  Document Released: 01/13/2005 Document Revised: 04/10/2013 Document Reviewed: 12/13/2012 [facility] Patient Information 2015 [place], [place]. This information is not intended to replace advice given to you by your health care provider. Make sure you discuss any questions you have with your health care provider.  Diverticulitis Diverticulitis is when small pockets that have formed in your colon (large intestine) become infected or swollen. HOME CARE  Follow your doctor's instructions.  Follow a special diet if told by your doctor.  When you feel better, your doctor may tell you to change your diet. You may be told to eat a lot of fiber. Fruits and vegetables are good sources of fiber. Fiber makes it easier to poop (have bowel movements).  Take supplements or probiotics as told by your doctor.  Only take medicines as told by your doctor.  Keep all follow-up visits with your doctor. GET HELP IF:  Your pain does not get better.  You have a hard time eating food.  You are not pooping like normal. GET HELP RIGHT AWAY IF:  Your pain gets worse.  Your problems do not get better.  Your problems suddenly get worse.  You have a fever.  You keep throwing up (vomiting).  You have bloody or black, tarry poop (stool). MAKE SURE YOU:   Understand these instructions.  Will watch your condition.  Will get help right away if you are not doing well or get worse. Document Released: 09/22/2007 Document Revised: 04/10/2013 Document Reviewed: 02/28/2013 [facility] Patient Information 2015 [place], [place]. This information is not intended to replace advice given to you by your health care provider. Make sure you discuss any  questions you have with your health care provider.

## 2014-11-04 ENCOUNTER — Other Ambulatory Visit: Payer: Self-pay | Admitting: *Deleted

## 2014-11-04 ENCOUNTER — Ambulatory Visit (INDEPENDENT_AMBULATORY_CARE_PROVIDER_SITE_OTHER): Payer: Medicare Other | Admitting: Internal Medicine

## 2014-11-04 ENCOUNTER — Encounter: Payer: Self-pay | Admitting: Internal Medicine

## 2014-11-04 VITALS — BP 138/80 | HR 75 | Temp 99.2°F | Resp 20 | Ht 69.0 in | Wt 222.0 lb

## 2014-11-04 DIAGNOSIS — M4810 Ankylosing hyperostosis [Forestier], site unspecified: Secondary | ICD-10-CM

## 2014-11-04 DIAGNOSIS — I10 Essential (primary) hypertension: Secondary | ICD-10-CM | POA: Diagnosis not present

## 2014-11-04 DIAGNOSIS — M160 Bilateral primary osteoarthritis of hip: Secondary | ICD-10-CM | POA: Diagnosis not present

## 2014-11-04 DIAGNOSIS — Z860100 Personal history of colon polyps, unspecified: Secondary | ICD-10-CM

## 2014-11-04 MED ORDER — TESTOSTERONE 20.25 MG/ACT (1.62%) TD GEL: TRANSDERMAL | Status: DC

## 2014-11-04 NOTE — Telephone Encounter (Signed)
Error

## 2014-11-04 NOTE — Patient Instructions (Signed)
Limit your sodium (Salt) intake  Diverticulosis Diverticulosis is the condition that develops when small pouches (diverticula) form in the wall of your colon. Your colon, or large intestine, is where water is absorbed and stool is formed. The pouches form when the inside layer of your colon pushes through weak spots in the outer layers of your colon. CAUSES  No one knows exactly what causes diverticulosis. RISK FACTORS  Being older than 72. Your risk for this condition increases with age. Diverticulosis is rare in people younger than 40 years. By age 57, almost everyone has it.  Eating a low-fiber diet.  Being frequently constipated.  Being overweight.  Not getting enough exercise.  Smoking.  Taking over-the-counter pain medicines, like aspirin and ibuprofen. SYMPTOMS  Most people with diverticulosis do not have symptoms. DIAGNOSIS  Because diverticulosis often has no symptoms, health care providers often discover the condition during an exam for other colon problems. In many cases, a health care provider will diagnose diverticulosis while using a flexible scope to examine the colon (colonoscopy). TREATMENT  If you have never developed an infection related to diverticulosis, you may not need treatment. If you have had an infection before, treatment may include:  Eating more fruits, vegetables, and grains.  Taking a fiber supplement.  Taking a live bacteria supplement (probiotic).  Taking medicine to relax your colon. HOME CARE INSTRUCTIONS   Drink at least 6-8 glasses of water each day to prevent constipation.  Try not to strain when you have a bowel movement.  Keep all follow-up appointments. If you have had an infection before:  Increase the fiber in your diet as directed by your health care provider or dietitian.  Take a dietary fiber supplement if your health care provider approves.  Only take medicines as directed by your health care provider. SEEK MEDICAL CARE  IF:   You have abdominal pain.  You have bloating.  You have cramps.  You have not gone to the bathroom in 3 days. SEEK IMMEDIATE MEDICAL CARE IF:   Your pain gets worse.  Yourbloating becomes very bad.  You have a fever or chills, and your symptoms suddenly get worse.  You begin vomiting.  You have bowel movements that are bloody or black. MAKE SURE YOU:  Understand these instructions.  Will watch your condition.  Will get help right away if you are not doing well or get worse. Document Released: 01/01/2004 Document Revised: 04/10/2013 Document Reviewed: 02/28/2013 [facility] Patient Information 2015 [place], [place]. This information is not intended to replace advice given to you by your health care provider. Make sure you discuss any questions you have with your health care provider.

## 2014-11-04 NOTE — Progress Notes (Signed)
Pre visit review using our clinic review tool, if applicable. No additional management support is needed unless otherwise documented below in the visit note. 
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
## 2014-11-05 DIAGNOSIS — H25813 Combined forms of age-related cataract, bilateral: Secondary | ICD-10-CM | POA: Diagnosis not present

## 2014-11-21 ENCOUNTER — Other Ambulatory Visit: Payer: Self-pay | Admitting: Internal Medicine

## 2015-01-03 ENCOUNTER — Other Ambulatory Visit: Payer: Self-pay | Admitting: Internal Medicine

## 2015-01-03 DIAGNOSIS — I6523 Occlusion and stenosis of bilateral carotid arteries: Secondary | ICD-10-CM

## 2015-01-06 ENCOUNTER — Ambulatory Visit (HOSPITAL_COMMUNITY)
Admission: RE | Admit: 2015-01-06 | Discharge: 2015-01-06 | Disposition: A | Discharge status: Home / Self Care | Payer: Medicare Other | Source: Ambulatory Visit | Attending: Cardiovascular Disease | Admitting: Cardiovascular Disease

## 2015-01-06 DIAGNOSIS — Z87891 Personal history of nicotine dependence: Secondary | ICD-10-CM | POA: Diagnosis not present

## 2015-01-06 DIAGNOSIS — I10 Essential (primary) hypertension: Secondary | ICD-10-CM | POA: Diagnosis not present

## 2015-01-06 DIAGNOSIS — I6523 Occlusion and stenosis of bilateral carotid arteries: Secondary | ICD-10-CM | POA: Insufficient documentation

## 2015-01-06 DIAGNOSIS — E785 Hyperlipidemia, unspecified: Secondary | ICD-10-CM | POA: Insufficient documentation

## 2015-01-10 ENCOUNTER — Telehealth: Payer: Self-pay | Admitting: *Deleted

## 2015-01-10 MED ORDER — LIDOCAINE 5 % EX PTCH: 1.0000 | MEDICATED_PATCH | CUTANEOUS | Status: DC

## 2015-01-10 NOTE — Telephone Encounter (Signed)
Okay for prescription for lidocaine patch trial

## 2015-01-10 NOTE — Telephone Encounter (Signed)
Pt called and would like to know if you would prescribed Lidocaine patch for his back pain. Pt does not want to take Hydrocodone all the time an get addicted. Told pt I will get back to him. Please advise.

## 2015-01-10 NOTE — Telephone Encounter (Signed)
Spoke to pt, told him Dr.K said okay to try Lidocaine Patch, Rx sent to pharmacy. Pt verbalized understanding.

## 2015-01-27 ENCOUNTER — Other Ambulatory Visit: Payer: Self-pay | Admitting: Internal Medicine

## 2015-03-04 ENCOUNTER — Other Ambulatory Visit: Payer: Self-pay | Admitting: Internal Medicine

## 2015-03-10 ENCOUNTER — Encounter: Payer: Self-pay | Admitting: Adult Health

## 2015-03-10 ENCOUNTER — Ambulatory Visit (INDEPENDENT_AMBULATORY_CARE_PROVIDER_SITE_OTHER): Payer: Medicare Other | Admitting: Adult Health

## 2015-03-10 VITALS — BP 130/72 | Temp 98.6°F | Ht 69.0 in | Wt 229.6 lb

## 2015-03-10 DIAGNOSIS — M4810 Ankylosing hyperostosis [Forestier], site unspecified: Secondary | ICD-10-CM

## 2015-03-10 DIAGNOSIS — I6523 Occlusion and stenosis of bilateral carotid arteries: Secondary | ICD-10-CM | POA: Diagnosis not present

## 2015-03-10 MED ORDER — PREDNISONE 20 MG PO TABS: ORAL_TABLET | ORAL | Status: DC

## 2015-03-10 NOTE — Patient Instructions (Signed)
It was great meeting you today!  I am sorry you are feeling bad.   Use the prednisone as directed  40 mg x 7 days 20 mg x 3 days 10 mg x 4 days.
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
## 2015-03-10 NOTE — Progress Notes (Signed)
Pre visit review using our clinic review tool, if applicable. No additional management support is needed unless otherwise documented below in the visit note. 

## 2015-03-12 ENCOUNTER — Telehealth: Payer: Self-pay | Admitting: Internal Medicine

## 2015-03-12 DIAGNOSIS — M4810 Ankylosing hyperostosis [Forestier], site unspecified: Secondary | ICD-10-CM

## 2015-03-12 MED ORDER — PREDNISONE 20 MG PO TABS: ORAL_TABLET | ORAL | Status: DC

## 2015-03-12 NOTE — Telephone Encounter (Signed)
Spoke to pt, I need correct address for pharmacy. Pt said it is in [facility]. Told him okay I found it. Rx sent to pharmacy. Pt verbalized understanding.

## 2015-03-12 NOTE — Telephone Encounter (Signed)
Pt said [place] the following [place]    predniSONE (DELTASONE) 20 MG tablet   Pharmacy [facility] [address] [place]

## 2015-03-31 ENCOUNTER — Other Ambulatory Visit: Payer: Self-pay | Admitting: Internal Medicine

## 2015-05-06 ENCOUNTER — Other Ambulatory Visit: Payer: Self-pay | Admitting: Internal Medicine

## 2015-05-08 ENCOUNTER — Other Ambulatory Visit: Payer: Self-pay | Admitting: Internal Medicine

## 2015-06-22 ENCOUNTER — Other Ambulatory Visit: Payer: Self-pay | Admitting: Internal Medicine

## 2015-06-24 ENCOUNTER — Ambulatory Visit (INDEPENDENT_AMBULATORY_CARE_PROVIDER_SITE_OTHER): Payer: Medicare Other | Admitting: Internal Medicine

## 2015-06-24 ENCOUNTER — Encounter: Payer: Self-pay | Admitting: Internal Medicine

## 2015-06-24 VITALS — BP 140/90 | HR 74 | Temp 99.1°F | Resp 20 | Ht 69.0 in | Wt 227.0 lb

## 2015-06-24 DIAGNOSIS — E291 Testicular hypofunction: Secondary | ICD-10-CM

## 2015-06-24 DIAGNOSIS — E785 Hyperlipidemia, unspecified: Secondary | ICD-10-CM | POA: Diagnosis not present

## 2015-06-24 DIAGNOSIS — N400 Enlarged prostate without lower urinary tract symptoms: Secondary | ICD-10-CM

## 2015-06-24 DIAGNOSIS — E349 Endocrine disorder, unspecified: Secondary | ICD-10-CM

## 2015-06-24 DIAGNOSIS — Z8719 Personal history of other diseases of the digestive system: Secondary | ICD-10-CM | POA: Diagnosis not present

## 2015-06-24 DIAGNOSIS — N200 Calculus of kidney: Secondary | ICD-10-CM | POA: Insufficient documentation

## 2015-06-24 DIAGNOSIS — Z86010 Personal history of colonic polyps: Secondary | ICD-10-CM | POA: Diagnosis not present

## 2015-06-24 DIAGNOSIS — Z0000 Encounter for general adult medical examination without abnormal findings: Secondary | ICD-10-CM

## 2015-06-24 DIAGNOSIS — M4810 Ankylosing hyperostosis [Forestier], site unspecified: Secondary | ICD-10-CM | POA: Diagnosis not present

## 2015-06-24 DIAGNOSIS — I10 Essential (primary) hypertension: Secondary | ICD-10-CM | POA: Diagnosis not present

## 2015-06-24 LAB — COMPREHENSIVE METABOLIC PANEL
ALBUMIN: 4.4 g/dL (ref 3.5–5.2)
ALT: 41 U/L (ref 0–53)
AST: 30 U/L (ref 0–37)
Alkaline Phosphatase: 18 U/L — ABNORMAL LOW (ref 39–117)
BUN: 14 mg/dL (ref 6–23)
CALCIUM: 9.7 mg/dL (ref 8.4–10.5)
CHLORIDE: 98 meq/L (ref 96–112)
CO2: 26 mEq/L (ref 19–32)
CREATININE: 1.04 mg/dL (ref 0.40–1.50)
GFR: 76.91 mL/min (ref >60.00)
Glucose, Bld: 98 mg/dL (ref 70–99)
Potassium: 4.3 mEq/L (ref 3.5–5.1)
Sodium: 138 mEq/L (ref 135–145)
Total Bilirubin: 0.9 mg/dL (ref 0.2–1.2)
Total Protein: 6.6 g/dL (ref 6.0–8.3)

## 2015-06-24 LAB — LIPID PANEL
CHOLESTEROL: 220 mg/dL — AB (ref 0–200)
HDL: 52.9 mg/dL (ref >39.00)
NonHDL: 166.68
Total CHOL/HDL Ratio: 4
Triglycerides: 250 mg/dL — ABNORMAL HIGH (ref 0.0–149.0)
VLDL: 50 mg/dL — AB (ref 0.0–40.0)

## 2015-06-24 LAB — CBC WITH DIFFERENTIAL/PLATELET
BASOS PCT: 0.3 % (ref 0.0–3.0)
Basophils Absolute: 0 10*3/uL (ref 0.0–0.1)
EOS ABS: 0.1 10*3/uL (ref 0.0–0.7)
Eosinophils Relative: 1.3 % (ref 0.0–5.0)
HCT: 43.6 % (ref 39.0–52.0)
Hemoglobin: 15.1 g/dL (ref 13.0–17.0)
LYMPHS ABS: 1.7 10*3/uL (ref 0.7–4.0)
LYMPHS PCT: 23.9 % (ref 12.0–46.0)
MCHC: 34.5 g/dL (ref 30.0–36.0)
MCV: 93.7 fl (ref 78.0–100.0)
MONO ABS: 0.6 10*3/uL (ref 0.1–1.0)
Monocytes Relative: 8.1 % (ref 3.0–12.0)
NEUTROS PCT: 66.4 % (ref 43.0–77.0)
Neutro Abs: 4.8 10*3/uL (ref 1.4–7.7)
PLATELETS: 244 10*3/uL (ref 150.0–400.0)
RBC: 4.66 Mil/uL (ref 4.22–5.81)
RDW: 12.7 % (ref 11.5–15.5)
WBC: 7.2 10*3/uL (ref 4.0–10.5)

## 2015-06-24 LAB — LDL CHOLESTEROL, DIRECT: LDL DIRECT: 136 mg/dL

## 2015-06-24 LAB — TSH: TSH: 1.98 u[IU]/mL (ref 0.35–4.50)

## 2015-06-24 LAB — PSA: PSA: 5.15 ng/mL — AB (ref 0.10–4.00)

## 2015-06-24 MED ORDER — CLOBETASOL PROPIONATE 0.05 % EX CREA: TOPICAL_CREAM | CUTANEOUS | Status: DC

## 2015-06-24 MED ORDER — FUROSEMIDE 40 MG PO TABS: 40.0000 mg | ORAL_TABLET | Freq: Every day | ORAL | Status: DC | PRN

## 2015-06-24 MED ORDER — METOPROLOL TARTRATE 100 MG PO TABS: 100.0000 mg | ORAL_TABLET | Freq: Two times a day (BID) | ORAL | Status: DC

## 2015-06-24 MED ORDER — MELOXICAM 7.5 MG PO TABS: ORAL_TABLET | ORAL | Status: DC

## 2015-06-24 MED ORDER — LIDOCAINE 5 % EX PTCH: MEDICATED_PATCH | CUTANEOUS | Status: DC

## 2015-06-24 MED ORDER — FLUTICASONE PROPIONATE 50 MCG/ACT NA SUSP: NASAL | Status: DC

## 2015-06-24 MED ORDER — AMLODIPINE BESYLATE 10 MG PO TABS: 10.0000 mg | ORAL_TABLET | Freq: Every day | ORAL | Status: DC

## 2015-06-24 MED ORDER — TRAMADOL HCL 50 MG PO TABS: ORAL_TABLET | ORAL | Status: DC

## 2015-06-24 MED ORDER — TESTOSTERONE 20.25 MG/ACT (1.62%) TD GEL: TRANSDERMAL | Status: DC

## 2015-06-24 NOTE — Patient Instructions (Signed)
Limit your sodium (Salt) intake  You need to lose weight.  Consider a lower calorie diet and regular exercise.  Schedule your colonoscopy to help detect colon cancer.  Return in 6 months for follow-up  Dermatology evaluation for the pigmented lesion of your right lower lip
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
## 2015-06-24 NOTE — Progress Notes (Signed)
Pre visit review using our clinic review tool, if applicable. No additional management support is needed unless otherwise documented below in the visit note. 

## 2015-06-25 ENCOUNTER — Other Ambulatory Visit: Payer: Self-pay | Admitting: Internal Medicine

## 2015-06-25 DIAGNOSIS — E349 Endocrine disorder, unspecified: Secondary | ICD-10-CM

## 2015-06-25 DIAGNOSIS — R972 Elevated prostate specific antigen [PSA]: Secondary | ICD-10-CM

## 2015-07-31 ENCOUNTER — Encounter: Payer: Self-pay | Admitting: Internal Medicine

## 2015-08-11 ENCOUNTER — Ambulatory Visit (INDEPENDENT_AMBULATORY_CARE_PROVIDER_SITE_OTHER): Payer: Medicare Other | Admitting: Internal Medicine

## 2015-08-11 ENCOUNTER — Encounter: Payer: Self-pay | Admitting: Internal Medicine

## 2015-08-11 VITALS — BP 130/86 | HR 78 | Temp 98.9°F | Resp 20 | Ht 69.0 in | Wt 225.0 lb

## 2015-08-11 DIAGNOSIS — R0602 Shortness of breath: Secondary | ICD-10-CM | POA: Diagnosis not present

## 2015-08-11 DIAGNOSIS — R5383 Other fatigue: Secondary | ICD-10-CM | POA: Diagnosis not present

## 2015-08-11 DIAGNOSIS — I10 Essential (primary) hypertension: Secondary | ICD-10-CM
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
## 2015-08-11 NOTE — Patient Instructions (Signed)
.    Nuclear stress test as discussed .  Continue present medical regimen including daily aspirin .  Report any new or worsening symptoms  .  Follow-up PSA as discussed

## 2015-08-11 NOTE — Progress Notes (Signed)
Pre visit review using our clinic review tool, if applicable. No additional management support is needed unless otherwise documented below in the visit note. 

## 2015-08-11 NOTE — Progress Notes (Signed)
   Subjective:    Patient ID: Luis Fisher, male    DOB: 02-Mar-1956, 60 y.o.   MRN: [id number]  HPI  EKG reviewed.  This revealed a normal sinus rhythm with occasional PVCs Poor R-wave progression.  Prior anteroseptal MI could not be excluded Low voltage No acute changes  Review of Systems     Objective:   Physical Exam        Assessment & Plan:

## 2015-08-18 ENCOUNTER — Encounter: Payer: Self-pay | Admitting: Internal Medicine

## 2015-08-25 ENCOUNTER — Encounter (HOSPITAL_COMMUNITY): Payer: Medicare Other

## 2015-08-27 ENCOUNTER — Encounter (HOSPITAL_COMMUNITY): Payer: Medicare Other

## 2015-09-24 ENCOUNTER — Encounter: Payer: Medicare Other | Admitting: Internal Medicine

## 2015-09-25 ENCOUNTER — Other Ambulatory Visit (INDEPENDENT_AMBULATORY_CARE_PROVIDER_SITE_OTHER): Payer: Medicare Other

## 2015-09-25 DIAGNOSIS — R972 Elevated prostate specific antigen [PSA]: Secondary | ICD-10-CM

## 2015-09-25 DIAGNOSIS — E349 Endocrine disorder, unspecified: Secondary | ICD-10-CM

## 2015-09-25 DIAGNOSIS — E291 Testicular hypofunction: Secondary | ICD-10-CM

## 2015-09-25 LAB — TESTOSTERONE: Testosterone: 198.57 ng/dL — ABNORMAL LOW (ref 300.00–890.00)

## 2015-09-26 LAB — PSA, TOTAL AND FREE
PSA, Free Pct: 12 % — ABNORMAL LOW (ref >25)
PSA, Free: 0.67 ng/mL
PSA: 5.63 ng/mL — AB (ref <=4.00)

## 2015-10-01 ENCOUNTER — Encounter: Payer: Self-pay | Admitting: Internal Medicine

## 2015-10-02 ENCOUNTER — Other Ambulatory Visit: Payer: Self-pay | Admitting: Internal Medicine

## 2015-10-02 DIAGNOSIS — R972 Elevated prostate specific antigen [PSA]: Secondary | ICD-10-CM

## 2015-11-19 HISTORY — PX: BIOPSY PROSTATE: PRO28

## 2015-11-24 DIAGNOSIS — R972 Elevated prostate specific antigen [PSA]: Secondary | ICD-10-CM | POA: Diagnosis not present

## 2015-11-24 DIAGNOSIS — N400 Enlarged prostate without lower urinary tract symptoms: Secondary | ICD-10-CM | POA: Diagnosis not present

## 2015-12-15 DIAGNOSIS — C61 Malignant neoplasm of prostate: Secondary | ICD-10-CM | POA: Diagnosis not present

## 2015-12-15 DIAGNOSIS — R972 Elevated prostate specific antigen [PSA]: Secondary | ICD-10-CM | POA: Diagnosis not present

## 2015-12-15 HISTORY — PX: TRANSRECTAL ULTRASOUND: SHX5146

## 2015-12-23 DIAGNOSIS — C61 Malignant neoplasm of prostate: Secondary | ICD-10-CM | POA: Diagnosis not present

## 2015-12-23 DIAGNOSIS — R972 Elevated prostate specific antigen [PSA]: Secondary | ICD-10-CM | POA: Diagnosis not present

## 2015-12-29 ENCOUNTER — Other Ambulatory Visit: Payer: Self-pay | Admitting: Internal Medicine

## 2015-12-31 ENCOUNTER — Ambulatory Visit (INDEPENDENT_AMBULATORY_CARE_PROVIDER_SITE_OTHER): Payer: Medicare Other | Admitting: Adult Health

## 2015-12-31 ENCOUNTER — Encounter: Payer: Self-pay | Admitting: Adult Health

## 2015-12-31 VITALS — BP 160/80 | Temp 98.2°F | Ht 69.0 in

## 2015-12-31 DIAGNOSIS — M4810 Ankylosing hyperostosis [Forestier], site unspecified: Secondary | ICD-10-CM | POA: Diagnosis not present

## 2015-12-31 MED ORDER — PREDNISONE 20 MG PO TABS: ORAL_TABLET | ORAL | 0 refills | Status: DC
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
## 2015-12-31 NOTE — Patient Instructions (Signed)
It was great seeing you today!  I am sorry you are feeling bad.   Use the prednisone as directed  40 mg x 7 days 20 mg x 3 days 10 mg x 4 days.   Please let me know if you need anything

## 2016-02-06 ENCOUNTER — Telehealth: Payer: Self-pay | Admitting: Internal Medicine

## 2016-02-06 DIAGNOSIS — M4810 Ankylosing hyperostosis [Forestier], site unspecified: Secondary | ICD-10-CM

## 2016-02-06 MED ORDER — PREDNISONE 20 MG PO TABS: 20.0000 mg | ORAL_TABLET | Freq: Two times a day (BID) | ORAL | 0 refills | Status: DC

## 2016-02-06 NOTE — Telephone Encounter (Signed)
Spoke to pt, asked him what pharmacy he would like this sent to. Pt said [facility] in [facility]. Told him okay I will send Rx for Prednisone to pharmacy. Pt verbalized understanding. Rx sent

## 2016-02-06 NOTE — Telephone Encounter (Signed)
Prednisone 20 mg #12 one twice a day

## 2016-02-06 NOTE — Telephone Encounter (Signed)
Please see message and advise 

## 2016-02-06 NOTE — Telephone Encounter (Signed)
Pt is out of town and having a bone flare up disease and would like some prednisone. Pt would like donna to return his call

## 2016-02-16 ENCOUNTER — Encounter: Payer: Self-pay | Admitting: Internal Medicine

## 2016-02-16 ENCOUNTER — Ambulatory Visit (INDEPENDENT_AMBULATORY_CARE_PROVIDER_SITE_OTHER): Payer: Medicare Other | Admitting: Internal Medicine

## 2016-02-16 VITALS — BP 130/70 | HR 75 | Temp 98.7°F | Resp 20 | Ht 69.0 in | Wt 222.2 lb

## 2016-02-16 DIAGNOSIS — C61 Malignant neoplasm of prostate: Secondary | ICD-10-CM | POA: Insufficient documentation

## 2016-02-16 DIAGNOSIS — M25572 Pain in left ankle and joints of left foot: Secondary | ICD-10-CM

## 2016-02-16 DIAGNOSIS — M160 Bilateral primary osteoarthritis of hip: Secondary | ICD-10-CM

## 2016-02-16 DIAGNOSIS — M4810 Ankylosing hyperostosis [Forestier], site unspecified: Secondary | ICD-10-CM | POA: Diagnosis not present

## 2016-02-16 DIAGNOSIS — Z86010 Personal history of colonic polyps: Secondary | ICD-10-CM

## 2016-02-16 DIAGNOSIS — I10 Essential (primary) hypertension: Secondary | ICD-10-CM

## 2016-02-16 LAB — URIC ACID: URIC ACID, SERUM: 7.1 mg/dL (ref 4.0–7.8)

## 2016-02-16 MED ORDER — AMLODIPINE BESYLATE 10 MG PO TABS: 10.0000 mg | ORAL_TABLET | Freq: Every day | ORAL | 1 refills | Status: DC

## 2016-02-16 MED ORDER — METOPROLOL TARTRATE 100 MG PO TABS: 100.0000 mg | ORAL_TABLET | Freq: Two times a day (BID) | ORAL | 3 refills | Status: DC

## 2016-02-16 NOTE — Progress Notes (Signed)
Pre visit review using our clinic review tool, if applicable. No additional management support is needed unless otherwise documented below in the visit note. 

## 2016-02-16 NOTE — Patient Instructions (Signed)
Urology follow-up as scheduled  Limit your sodium (Salt) intake  Please check your blood pressure on a regular basis.  If it is consistently greater than 150/90, please make an office appointment.  Return in 6 months for follow-up
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
## 2016-03-30 DIAGNOSIS — R972 Elevated prostate specific antigen [PSA]: Secondary | ICD-10-CM | POA: Diagnosis not present

## 2016-03-30 DIAGNOSIS — C61 Malignant neoplasm of prostate: Secondary | ICD-10-CM | POA: Diagnosis not present

## 2016-03-30 HISTORY — PX: TRANSRECTAL ULTRASOUND: SHX5146

## 2016-03-30 HISTORY — PX: BIOPSY PROSTATE: PRO28

## 2016-04-30 ENCOUNTER — Telehealth: Payer: Self-pay | Admitting: Internal Medicine

## 2016-04-30 MED ORDER — OSELTAMIVIR PHOSPHATE 75 MG PO CAPS: 75.0000 mg | ORAL_CAPSULE | Freq: Two times a day (BID) | ORAL | 0 refills | Status: DC

## 2016-04-30 NOTE — Telephone Encounter (Signed)
Tamiflu 75 mg #10.  One tablet twice daily for flu symptoms

## 2016-04-30 NOTE — Telephone Encounter (Signed)
Pt traveling to [place], will be gone a month. Would like a rx for Tamiflu to take with him just in case. p [facility]

## 2016-04-30 NOTE — Telephone Encounter (Signed)
Tamiflu 75 mg #10 was called in to [facility]. Pt is to take one tablet twice daily for flu symptoms.  Spoke to pt and informed. Pt verbalized understanding.

## 2016-04-30 NOTE — Telephone Encounter (Signed)
See message below  Please advise

## 2016-05-03 DIAGNOSIS — C61 Malignant neoplasm of prostate: Secondary | ICD-10-CM | POA: Diagnosis not present

## 2016-05-03 DIAGNOSIS — R972 Elevated prostate specific antigen [PSA]: Secondary | ICD-10-CM | POA: Diagnosis not present

## 2016-07-27 ENCOUNTER — Other Ambulatory Visit: Payer: Self-pay | Admitting: Internal Medicine

## 2016-09-03 ENCOUNTER — Other Ambulatory Visit: Payer: Self-pay | Admitting: Internal Medicine

## 2016-10-04 ENCOUNTER — Other Ambulatory Visit: Payer: Self-pay | Admitting: Internal Medicine

## 2016-10-04 ENCOUNTER — Ambulatory Visit (INDEPENDENT_AMBULATORY_CARE_PROVIDER_SITE_OTHER): Payer: Medicare Other | Admitting: Internal Medicine

## 2016-10-04 ENCOUNTER — Encounter: Payer: Self-pay | Admitting: Internal Medicine

## 2016-10-04 VITALS — BP 140/78 | HR 73 | Temp 98.2°F | Ht 67.0 in | Wt 229.6 lb

## 2016-10-04 DIAGNOSIS — E785 Hyperlipidemia, unspecified: Secondary | ICD-10-CM | POA: Diagnosis not present

## 2016-10-04 DIAGNOSIS — I10 Essential (primary) hypertension: Secondary | ICD-10-CM

## 2016-10-04 DIAGNOSIS — Z0000 Encounter for general adult medical examination without abnormal findings: Secondary | ICD-10-CM

## 2016-10-04 DIAGNOSIS — C61 Malignant neoplasm of prostate: Secondary | ICD-10-CM

## 2016-10-04 DIAGNOSIS — Z86010 Personal history of colonic polyps: Secondary | ICD-10-CM | POA: Diagnosis not present

## 2016-10-04 DIAGNOSIS — M4810 Ankylosing hyperostosis [Forestier], site unspecified: Secondary | ICD-10-CM

## 2016-10-04 LAB — COMPREHENSIVE METABOLIC PANEL
ALBUMIN: 4.4 g/dL (ref 3.5–5.2)
ALK PHOS: 18 U/L — AB (ref 39–117)
ALT: 25 U/L (ref 0–53)
AST: 18 U/L (ref 0–37)
BILIRUBIN TOTAL: 0.5 mg/dL (ref 0.2–1.2)
BUN: 14 mg/dL (ref 6–23)
CALCIUM: 9.8 mg/dL (ref 8.4–10.5)
CHLORIDE: 105 meq/L (ref 96–112)
CO2: 26 mEq/L (ref 19–32)
CREATININE: 0.91 mg/dL (ref 0.40–1.50)
GFR: 89.35 mL/min (ref >60.00)
Glucose, Bld: 101 mg/dL — ABNORMAL HIGH (ref 70–99)
Potassium: 4.3 mEq/L (ref 3.5–5.1)
Sodium: 140 mEq/L (ref 135–145)
TOTAL PROTEIN: 6.6 g/dL (ref 6.0–8.3)

## 2016-10-04 LAB — LIPID PANEL
CHOLESTEROL: 206 mg/dL — AB (ref 0–200)
HDL: 47.1 mg/dL (ref >39.00)
LDL CALC: 119 mg/dL — AB (ref 0–99)
NonHDL: 158.73
TRIGLYCERIDES: 198 mg/dL — AB (ref 0.0–149.0)
Total CHOL/HDL Ratio: 4
VLDL: 39.6 mg/dL (ref 0.0–40.0)

## 2016-10-04 LAB — CBC WITH DIFFERENTIAL/PLATELET
Basophils Absolute: 0 10*3/uL (ref 0.0–0.1)
Basophils Relative: 0.8 % (ref 0.0–3.0)
EOS ABS: 0.1 10*3/uL (ref 0.0–0.7)
Eosinophils Relative: 1.5 % (ref 0.0–5.0)
HEMATOCRIT: 43.4 % (ref 39.0–52.0)
HEMOGLOBIN: 15 g/dL (ref 13.0–17.0)
LYMPHS PCT: 27.6 % (ref 12.0–46.0)
Lymphs Abs: 1.8 10*3/uL (ref 0.7–4.0)
MCHC: 34.6 g/dL (ref 30.0–36.0)
MCV: 93 fl (ref 78.0–100.0)
MONO ABS: 0.5 10*3/uL (ref 0.1–1.0)
Monocytes Relative: 7.3 % (ref 3.0–12.0)
Neutro Abs: 4 10*3/uL (ref 1.4–7.7)
Neutrophils Relative %: 62.8 % (ref 43.0–77.0)
Platelets: 268 10*3/uL (ref 150.0–400.0)
RBC: 4.67 Mil/uL (ref 4.22–5.81)
RDW: 12.7 % (ref 11.5–15.5)
WBC: 6.4 10*3/uL (ref 4.0–10.5)

## 2016-10-04 LAB — TSH: TSH: 2.74 u[IU]/mL (ref 0.35–4.50)

## 2016-10-04 MED ORDER — TRAMADOL HCL 50 MG PO TABS: ORAL_TABLET | ORAL | 0 refills | Status: DC

## 2016-10-04 MED ORDER — PREDNISONE 20 MG PO TABS: 20.0000 mg | ORAL_TABLET | Freq: Two times a day (BID) | ORAL | 2 refills | Status: DC
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
## 2016-10-04 NOTE — Patient Instructions (Addendum)
WE NOW OFFER   [facility] FAST TRACK!!!  SAME DAY Appointments for ACUTE CARE  Such as: Sprains, Injuries, cuts, abrasions, rashes, muscle pain, joint pain, back pain Colds, flu, sore throats, headache, allergies, cough, fever  Ear pain, sinus and eye infections Abdominal pain, nausea, vomiting, diarrhea, upset stomach Animal/insect bites  3 Easy Ways to Schedule: Walk-In Scheduling Call in scheduling Mychart Sign-up: [email or web address]   Limit your sodium (Salt) intake  Please check your blood pressure on a regular basis.  If it is consistently greater than 150/90, please make an office appointment.  You need to lose weight.  Consider a lower calorie diet and regular exercise.  Return in 6 months for follow-up  Schedule your colonoscopy to help detect colon cancer.

## 2016-10-05 LAB — HEPATITIS C ANTIBODY: HCV AB: NEGATIVE

## 2016-10-27 DIAGNOSIS — C61 Malignant neoplasm of prostate: Secondary | ICD-10-CM | POA: Diagnosis not present

## 2016-11-03 DIAGNOSIS — N400 Enlarged prostate without lower urinary tract symptoms: Secondary | ICD-10-CM | POA: Diagnosis not present

## 2016-11-03 DIAGNOSIS — R972 Elevated prostate specific antigen [PSA]: Secondary | ICD-10-CM | POA: Diagnosis not present

## 2016-11-05 ENCOUNTER — Encounter: Payer: Self-pay | Admitting: Internal Medicine

## 2016-11-06 ENCOUNTER — Other Ambulatory Visit: Payer: Self-pay | Admitting: Internal Medicine

## 2016-11-08 ENCOUNTER — Other Ambulatory Visit: Payer: Self-pay | Admitting: Internal Medicine

## 2016-11-08 MED ORDER — METOPROLOL TARTRATE 100 MG PO TABS: 100.0000 mg | ORAL_TABLET | Freq: Two times a day (BID) | ORAL | 1 refills | Status: DC

## 2016-11-08 MED ORDER — AMLODIPINE BESYLATE 10 MG PO TABS: 10.0000 mg | ORAL_TABLET | Freq: Every day | ORAL | 1 refills | Status: DC

## 2016-11-09 ENCOUNTER — Encounter: Payer: Self-pay | Admitting: Internal Medicine

## 2016-11-09 ENCOUNTER — Other Ambulatory Visit: Payer: Self-pay | Admitting: Internal Medicine

## 2016-11-09 ENCOUNTER — Other Ambulatory Visit: Payer: Self-pay

## 2016-11-09 MED ORDER — FUROSEMIDE 40 MG PO TABS: 40.0000 mg | ORAL_TABLET | Freq: Every day | ORAL | 5 refills | Status: DC | PRN

## 2016-11-09 MED ORDER — AMLODIPINE BESYLATE 10 MG PO TABS: 10.0000 mg | ORAL_TABLET | Freq: Every day | ORAL | 5 refills | Status: DC

## 2016-11-09 MED ORDER — LIDOCAINE 5 % EX PTCH: MEDICATED_PATCH | CUTANEOUS | 5 refills | Status: DC

## 2016-11-09 MED ORDER — CLOBETASOL PROPIONATE 0.05 % EX CREA: TOPICAL_CREAM | Freq: Two times a day (BID) | CUTANEOUS | 5 refills | Status: DC | PRN

## 2016-11-09 MED ORDER — MELOXICAM 7.5 MG PO TABS: 7.5000 mg | ORAL_TABLET | Freq: Every day | ORAL | 5 refills | Status: DC | PRN

## 2016-11-09 MED ORDER — METOPROLOL TARTRATE 100 MG PO TABS: 100.0000 mg | ORAL_TABLET | Freq: Two times a day (BID) | ORAL | 5 refills | Status: DC

## 2016-11-09 MED ORDER — FLUTICASONE PROPIONATE 50 MCG/ACT NA SUSP: NASAL | 5 refills | Status: DC

## 2017-01-06 ENCOUNTER — Encounter: Payer: Self-pay | Admitting: Internal Medicine

## 2017-01-11 DIAGNOSIS — H2513 Age-related nuclear cataract, bilateral: Secondary | ICD-10-CM | POA: Diagnosis not present

## 2017-01-11 DIAGNOSIS — H43813 Vitreous degeneration, bilateral: Secondary | ICD-10-CM | POA: Diagnosis not present

## 2017-01-11 DIAGNOSIS — H524 Presbyopia: Secondary | ICD-10-CM | POA: Diagnosis not present

## 2017-01-11 DIAGNOSIS — H5202 Hypermetropia, left eye: Secondary | ICD-10-CM | POA: Diagnosis not present

## 2017-01-13 ENCOUNTER — Encounter: Payer: Self-pay | Admitting: Internal Medicine

## 2017-01-17 ENCOUNTER — Encounter: Payer: Self-pay | Admitting: Gastroenterology

## 2017-01-24 ENCOUNTER — Other Ambulatory Visit: Payer: Self-pay | Admitting: Internal Medicine

## 2017-01-24 DIAGNOSIS — I6523 Occlusion and stenosis of bilateral carotid arteries: Secondary | ICD-10-CM

## 2017-02-08 ENCOUNTER — Encounter: Payer: Self-pay | Admitting: Internal Medicine

## 2017-02-14 ENCOUNTER — Ambulatory Visit (HOSPITAL_COMMUNITY)
Admission: RE | Admit: 2017-02-14 | Discharge: 2017-02-14 | Disposition: A | Discharge status: Home / Self Care | Payer: Medicare Other | Source: Ambulatory Visit | Attending: Cardiovascular Disease | Admitting: Cardiovascular Disease

## 2017-02-14 DIAGNOSIS — E785 Hyperlipidemia, unspecified: Secondary | ICD-10-CM | POA: Diagnosis not present

## 2017-02-14 DIAGNOSIS — I10 Essential (primary) hypertension: Secondary | ICD-10-CM | POA: Insufficient documentation

## 2017-02-14 DIAGNOSIS — Z87891 Personal history of nicotine dependence: Secondary | ICD-10-CM | POA: Insufficient documentation

## 2017-02-14 DIAGNOSIS — I6523 Occlusion and stenosis of bilateral carotid arteries: Secondary | ICD-10-CM | POA: Insufficient documentation

## 2017-02-15 ENCOUNTER — Other Ambulatory Visit: Payer: Self-pay | Admitting: Internal Medicine

## 2017-02-15 MED ORDER — AMLODIPINE BESYLATE 10 MG PO TABS: 10.0000 mg | ORAL_TABLET | Freq: Every day | ORAL | 2 refills | Status: DC

## 2017-02-23 DIAGNOSIS — Z23 Encounter for immunization: Secondary | ICD-10-CM | POA: Diagnosis not present

## 2017-03-22 ENCOUNTER — Encounter: Payer: Self-pay | Admitting: Internal Medicine

## 2017-03-23 ENCOUNTER — Other Ambulatory Visit: Payer: Self-pay

## 2017-03-23 ENCOUNTER — Ambulatory Visit (AMBULATORY_SURGERY_CENTER): Payer: Self-pay | Admitting: *Deleted

## 2017-03-23 VITALS — Ht 68.0 in | Wt 230.0 lb

## 2017-03-23 DIAGNOSIS — Z1211 Encounter for screening for malignant neoplasm of colon: Secondary | ICD-10-CM

## 2017-03-23 NOTE — Progress Notes (Signed)
Patient denies any allergies to eggs or soy. Patient denies any problems with anesthesia/sedation. Patient denies any oxygen use at home. Patient denies taking any diet/weight loss medications or blood thinners. EMMI education assisgned to patient on colonoscopy, this was explained and instructions given to patient. John Nulty,CRNA came into Pre-visit to assess patient due to his DISH bone disease, he is unable to rotate/flex neck. Per Jenny Reichmann CRNA recommends patient's colonoscopy be done at the hospital. Patient request office visit to discuss any options with MD. Office visit made.

## 2017-03-24 ENCOUNTER — Other Ambulatory Visit: Payer: Self-pay | Admitting: Internal Medicine

## 2017-03-24 MED ORDER — PREDNISONE 20 MG PO TABS: ORAL_TABLET | ORAL | 0 refills | Status: DC

## 2017-04-06 ENCOUNTER — Encounter: Payer: Medicare Other | Admitting: Gastroenterology

## 2017-05-03 ENCOUNTER — Ambulatory Visit: Payer: Medicare Other | Admitting: Internal Medicine

## 2017-05-04 DIAGNOSIS — R972 Elevated prostate specific antigen [PSA]: Secondary | ICD-10-CM | POA: Diagnosis not present

## 2017-05-05 ENCOUNTER — Ambulatory Visit (INDEPENDENT_AMBULATORY_CARE_PROVIDER_SITE_OTHER): Payer: Medicare Other | Admitting: Internal Medicine

## 2017-05-05 ENCOUNTER — Encounter: Payer: Self-pay | Admitting: Internal Medicine

## 2017-05-05 VITALS — BP 130/64 | HR 88 | Temp 98.5°F | Ht 68.0 in | Wt 231.6 lb

## 2017-05-05 DIAGNOSIS — M4810 Ankylosing hyperostosis [Forestier], site unspecified: Secondary | ICD-10-CM

## 2017-05-05 DIAGNOSIS — I10 Essential (primary) hypertension: Secondary | ICD-10-CM | POA: Diagnosis not present

## 2017-05-05 DIAGNOSIS — M160 Bilateral primary osteoarthritis of hip: Secondary | ICD-10-CM

## 2017-05-05 DIAGNOSIS — Z860100 Personal history of colon polyps, unspecified: Secondary | ICD-10-CM

## 2017-05-05 DIAGNOSIS — C61 Malignant neoplasm of prostate: Secondary | ICD-10-CM | POA: Diagnosis not present

## 2017-05-05 DIAGNOSIS — L989 Disorder of the skin and subcutaneous tissue, unspecified: Secondary | ICD-10-CM | POA: Diagnosis not present
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
## 2017-05-05 NOTE — Patient Instructions (Signed)
Limit your sodium (Salt) intake  Please check your blood pressure on a regular basis.  If it is consistently greater than 140/90, please make an office appointment.  Return in 6 months for follow-up  

## 2017-05-06 DIAGNOSIS — L821 Other seborrheic keratosis: Secondary | ICD-10-CM | POA: Diagnosis not present

## 2017-05-06 DIAGNOSIS — L281 Prurigo nodularis: Secondary | ICD-10-CM | POA: Diagnosis not present

## 2017-05-06 DIAGNOSIS — D1801 Hemangioma of skin and subcutaneous tissue: Secondary | ICD-10-CM | POA: Diagnosis not present

## 2017-05-06 DIAGNOSIS — L814 Other melanin hyperpigmentation: Secondary | ICD-10-CM | POA: Diagnosis not present

## 2017-05-06 DIAGNOSIS — L570 Actinic keratosis: Secondary | ICD-10-CM | POA: Diagnosis not present

## 2017-05-06 DIAGNOSIS — D225 Melanocytic nevi of trunk: Secondary | ICD-10-CM | POA: Diagnosis not present

## 2017-05-06 DIAGNOSIS — L218 Other seborrheic dermatitis: Secondary | ICD-10-CM | POA: Diagnosis not present

## 2017-05-06 DIAGNOSIS — D224 Melanocytic nevi of scalp and neck: Secondary | ICD-10-CM | POA: Diagnosis not present

## 2017-05-06 DIAGNOSIS — D2261 Melanocytic nevi of right upper limb, including shoulder: Secondary | ICD-10-CM | POA: Diagnosis not present

## 2017-05-06 DIAGNOSIS — L738 Other specified follicular disorders: Secondary | ICD-10-CM | POA: Diagnosis not present

## 2017-05-10 ENCOUNTER — Other Ambulatory Visit: Payer: Self-pay | Admitting: Internal Medicine

## 2017-05-10 DIAGNOSIS — R972 Elevated prostate specific antigen [PSA]: Secondary | ICD-10-CM | POA: Diagnosis not present

## 2017-05-10 MED ORDER — AMLODIPINE BESYLATE 10 MG PO TABS: 10.0000 mg | ORAL_TABLET | Freq: Every day | ORAL | 2 refills | Status: DC

## 2017-05-10 MED ORDER — MELOXICAM 7.5 MG PO TABS: 7.5000 mg | ORAL_TABLET | Freq: Every day | ORAL | 5 refills | Status: DC | PRN

## 2017-05-10 MED ORDER — METOPROLOL TARTRATE 100 MG PO TABS: 100.0000 mg | ORAL_TABLET | Freq: Two times a day (BID) | ORAL | 5 refills | Status: DC

## 2017-05-12 ENCOUNTER — Encounter: Payer: Self-pay | Admitting: Gastroenterology

## 2017-05-12 ENCOUNTER — Ambulatory Visit (INDEPENDENT_AMBULATORY_CARE_PROVIDER_SITE_OTHER): Payer: Medicare Other | Admitting: Gastroenterology

## 2017-05-12 VITALS — BP 124/70 | HR 88 | Ht 68.0 in | Wt 231.0 lb

## 2017-05-12 DIAGNOSIS — M4810 Ankylosing hyperostosis [Forestier], site unspecified: Secondary | ICD-10-CM

## 2017-05-12 DIAGNOSIS — Z8719 Personal history of other diseases of the digestive system: Secondary | ICD-10-CM

## 2017-05-12 DIAGNOSIS — Z1211 Encounter for screening for malignant neoplasm of colon: Secondary | ICD-10-CM | POA: Diagnosis not present
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
## 2017-05-12 NOTE — Patient Instructions (Addendum)
If you are age 64 or older, your body mass index should be between 23-30. Your Body mass index is 35.12 kg/m. If this is out of the aforementioned range listed, please consider follow up with your Primary Care Provider.  If you are age 42 or younger, your body mass index should be between 19-25. Your Body mass index is 35.12 kg/m. If this is out of the aformentioned range listed, please consider follow up with your Primary Care Provider.   Your provider has ordered Cologuard testing as an option for colon cancer screening. This is performed by [employer] and may be out of network with your insurance. PRIOR to completing the test, it is YOUR responsibility to contact your insurance about covered benefits for this test. Your out of pocket expense could be anywhere from $0.00 to $649.00.   When you call to check coverage with your insurer, please provide the following information:   -The ONLY provider of Cologuard is [place] code for Cologuard is [phone].  [employer] NPI # [id number]  -[employer] Tax ID # [id number]   We have already sent your demographic and insurance information to [employer] (phone number [phone]) and they should contact you within the next week regarding your test. If you have not heard from them within the next week, please call our office at [phone].   Thank you for entrusting me with your care and for choosing [facility], Dr. [place] Cellar

## 2017-06-15 ENCOUNTER — Other Ambulatory Visit: Payer: Self-pay | Admitting: Internal Medicine

## 2017-06-15 NOTE — Telephone Encounter (Signed)
Okay to refill? 

## 2017-06-16 NOTE — Telephone Encounter (Signed)
Okay to refill? 

## 2017-07-12 ENCOUNTER — Telehealth: Payer: Self-pay

## 2017-07-12 NOTE — Telephone Encounter (Signed)
Called and LM for pt. Asked him to call us back and let us know the status of Cologuard, whether they have reached him and whether he has returned sample.

## 2017-07-12 NOTE — Telephone Encounter (Signed)
rec'd fax from [facility] that pt has not returned sample.  Left detailed message for pt at [phone] to call back to discuss status of Cologuard test.

## 2017-07-12 NOTE — Telephone Encounter (Signed)
Patient returning call from Jan.

## 2017-07-12 NOTE — Telephone Encounter (Signed)
Pt said he is returning your call 

## 2017-07-14 NOTE — Telephone Encounter (Signed)
Pt is returning your call.  He said the "sample" will be going out on Monday

## 2017-07-22 ENCOUNTER — Other Ambulatory Visit: Payer: Self-pay | Admitting: Internal Medicine

## 2017-07-27 NOTE — Telephone Encounter (Signed)
Sample still not rec'd by [employer]. Called and LM for pt to call back.

## 2017-07-28 DIAGNOSIS — Z1211 Encounter for screening for malignant neoplasm of colon: Secondary | ICD-10-CM | POA: Diagnosis not present

## 2017-07-28 DIAGNOSIS — Z1212 Encounter for screening for malignant neoplasm of rectum: Secondary | ICD-10-CM | POA: Diagnosis not present

## 2017-07-29 NOTE — Telephone Encounter (Signed)
Patient returning Jan's call to let her know sample was sent late and should be received soon.

## 2017-08-03 DIAGNOSIS — R972 Elevated prostate specific antigen [PSA]: Secondary | ICD-10-CM | POA: Diagnosis not present

## 2017-08-08 ENCOUNTER — Other Ambulatory Visit: Payer: Self-pay

## 2017-08-08 LAB — COLOGUARD: Cologuard: NEGATIVE

## 2017-08-09 DIAGNOSIS — E230 Hypopituitarism: Secondary | ICD-10-CM | POA: Diagnosis not present

## 2017-08-09 DIAGNOSIS — C61 Malignant neoplasm of prostate: Secondary | ICD-10-CM | POA: Diagnosis not present

## 2017-08-09 DIAGNOSIS — R972 Elevated prostate specific antigen [PSA]: Secondary | ICD-10-CM | POA: Diagnosis not present

## 2017-08-26 DIAGNOSIS — E230 Hypopituitarism: Secondary | ICD-10-CM | POA: Diagnosis not present

## 2017-08-26 DIAGNOSIS — C61 Malignant neoplasm of prostate: Secondary | ICD-10-CM | POA: Diagnosis not present

## 2017-08-29 DIAGNOSIS — C61 Malignant neoplasm of prostate: Secondary | ICD-10-CM | POA: Diagnosis not present

## 2017-08-29 DIAGNOSIS — R972 Elevated prostate specific antigen [PSA]: Secondary | ICD-10-CM | POA: Diagnosis not present

## 2017-08-29 HISTORY — PX: TRANSRECTAL ULTRASOUND: SHX5146

## 2017-08-29 HISTORY — PX: BIOPSY PROSTATE: PRO28

## 2017-09-29 DIAGNOSIS — C61 Malignant neoplasm of prostate: Secondary | ICD-10-CM | POA: Diagnosis not present

## 2017-09-29 DIAGNOSIS — E230 Hypopituitarism: Secondary | ICD-10-CM | POA: Diagnosis not present

## 2017-09-30 ENCOUNTER — Encounter: Payer: Self-pay | Admitting: Radiation Oncology

## 2017-10-11 ENCOUNTER — Ambulatory Visit: Payer: Medicare Other | Admitting: Internal Medicine

## 2017-10-12 ENCOUNTER — Encounter: Payer: Self-pay | Admitting: Radiation Oncology

## 2017-10-12 NOTE — Progress Notes (Addendum)
GU Location of Tumor / Histology: Adenocarcinoma of the prostate  If Prostate Cancer, on 08/29/2017 his Gleason Score was (3 + 4) and Prostate Volume was ~32 grams, and on 08/05/2017 his PSA was (9.9)  Luis Fisher was initial diagnosed with prostate cancer in April 2018. Patient opted for active surveillance. Second prostate biopsy done December 2018. Third prostate biopsy done 08/29/2017 revealed progression. Dr. Jeffie Pollock referred the patient for consideration of EXRT. Patient no interested in surgery due to risk of incontinence.   Biopsies on 08/29/2017 revealed:    Past/Anticipated interventions by urology, if any: prostate biopsies x 3.  Past/Anticipated interventions by medical oncology, if any: no  Weight changes, if any: no  Bowel/Bladder complaints, if any: IPSS 1. SHIM 3. Denies dysuria or hematuria. Reports blood in semen. Denies urinary leakage or incontinence.   Nausea/Vomiting, if any: no  Pain issues, if any:  Reports pain related to DISH and neck fusion.   SAFETY ISSUES:  Prior radiation? no  Pacemaker/ICD? no  Possible current pregnancy? N/A  Is the patient on methotrexate? no  Current Complaints / other details:  62 year old male. Married. Medically disabled. No children. Resides in [place]. Reports he is claustrophobic.
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
## 2017-10-17 ENCOUNTER — Ambulatory Visit
Admission: RE | Admit: 2017-10-17 | Discharge: 2017-10-17 | Disposition: A | Discharge status: Home / Self Care | Payer: Medicare Other | Source: Ambulatory Visit | Attending: Radiation Oncology | Admitting: Radiation Oncology

## 2017-10-17 ENCOUNTER — Ambulatory Visit (INDEPENDENT_AMBULATORY_CARE_PROVIDER_SITE_OTHER): Payer: Medicare Other | Admitting: Internal Medicine

## 2017-10-17 ENCOUNTER — Encounter: Payer: Self-pay | Admitting: Internal Medicine

## 2017-10-17 ENCOUNTER — Other Ambulatory Visit: Payer: Self-pay

## 2017-10-17 ENCOUNTER — Encounter: Payer: Self-pay | Admitting: Medical Oncology

## 2017-10-17 ENCOUNTER — Encounter: Payer: Self-pay | Admitting: Radiation Oncology

## 2017-10-17 VITALS — BP 121/78 | HR 86 | Temp 98.5°F | Resp 20 | Ht 67.0 in | Wt 227.6 lb

## 2017-10-17 VITALS — BP 110/60 | HR 90 | Temp 98.9°F | Wt 228.0 lb

## 2017-10-17 DIAGNOSIS — M4810 Ankylosing hyperostosis [Forestier], site unspecified: Secondary | ICD-10-CM | POA: Diagnosis not present

## 2017-10-17 DIAGNOSIS — R972 Elevated prostate specific antigen [PSA]: Secondary | ICD-10-CM | POA: Diagnosis not present

## 2017-10-17 DIAGNOSIS — F40240 Claustrophobia: Secondary | ICD-10-CM

## 2017-10-17 DIAGNOSIS — Z86010 Personal history of colonic polyps: Secondary | ICD-10-CM

## 2017-10-17 DIAGNOSIS — D62 Acute posthemorrhagic anemia: Secondary | ICD-10-CM | POA: Diagnosis not present

## 2017-10-17 DIAGNOSIS — C61 Malignant neoplasm of prostate: Secondary | ICD-10-CM

## 2017-10-17 DIAGNOSIS — E785 Hyperlipidemia, unspecified: Secondary | ICD-10-CM

## 2017-10-17 DIAGNOSIS — R9721 Rising PSA following treatment for malignant neoplasm of prostate: Secondary | ICD-10-CM | POA: Insufficient documentation

## 2017-10-17 DIAGNOSIS — I10 Essential (primary) hypertension: Secondary | ICD-10-CM

## 2017-10-17 HISTORY — DX: Malignant neoplasm of prostate: C61

## 2017-10-17 LAB — LIPID PANEL
CHOL/HDL RATIO: 5
CHOLESTEROL: 233 mg/dL — AB (ref 0–200)
HDL: 45.6 mg/dL (ref >39.00)
NonHDL: 187.04
TRIGLYCERIDES: 336 mg/dL — AB (ref 0.0–149.0)
VLDL: 67.2 mg/dL — ABNORMAL HIGH (ref 0.0–40.0)

## 2017-10-17 LAB — CBC WITH DIFFERENTIAL/PLATELET
BASOS PCT: 0.6 % (ref 0.0–3.0)
Basophils Absolute: 0.1 10*3/uL (ref 0.0–0.1)
EOS ABS: 0.1 10*3/uL (ref 0.0–0.7)
Eosinophils Relative: 1 % (ref 0.0–5.0)
HEMATOCRIT: 45.8 % (ref 39.0–52.0)
HEMOGLOBIN: 15.7 g/dL (ref 13.0–17.0)
LYMPHS PCT: 27 % (ref 12.0–46.0)
Lymphs Abs: 2.2 10*3/uL (ref 0.7–4.0)
MCHC: 34.4 g/dL (ref 30.0–36.0)
MCV: 93.9 fl (ref 78.0–100.0)
Monocytes Absolute: 0.7 10*3/uL (ref 0.1–1.0)
Monocytes Relative: 8.4 % (ref 3.0–12.0)
NEUTROS ABS: 5.1 10*3/uL (ref 1.4–7.7)
Neutrophils Relative %: 63 % (ref 43.0–77.0)
PLATELETS: 267 10*3/uL (ref 150.0–400.0)
RBC: 4.87 Mil/uL (ref 4.22–5.81)
RDW: 13.1 % (ref 11.5–15.5)
WBC: 8.1 10*3/uL (ref 4.0–10.5)

## 2017-10-17 LAB — COMPREHENSIVE METABOLIC PANEL
ALBUMIN: 4.5 g/dL (ref 3.5–5.2)
ALK PHOS: 19 U/L — AB (ref 39–117)
ALT: 34 U/L (ref 0–53)
AST: 25 U/L (ref 0–37)
BILIRUBIN TOTAL: 0.4 mg/dL (ref 0.2–1.2)
BUN: 14 mg/dL (ref 6–23)
CALCIUM: 9.7 mg/dL (ref 8.4–10.5)
CO2: 28 mEq/L (ref 19–32)
CREATININE: 1.03 mg/dL (ref 0.40–1.50)
Chloride: 103 mEq/L (ref 96–112)
GFR: 77.19 mL/min (ref >60.00)
Glucose, Bld: 141 mg/dL — ABNORMAL HIGH (ref 70–99)
Potassium: 4.2 mEq/L (ref 3.5–5.1)
Sodium: 140 mEq/L (ref 135–145)
TOTAL PROTEIN: 6.7 g/dL (ref 6.0–8.3)

## 2017-10-17 LAB — TSH: TSH: 1.2 u[IU]/mL (ref 0.35–4.50)

## 2017-10-17 LAB — URIC ACID: Uric Acid, Serum: 8.9 mg/dL — ABNORMAL HIGH (ref 4.0–7.8)

## 2017-10-17 LAB — LDL CHOLESTEROL, DIRECT: LDL DIRECT: 144 mg/dL

## 2017-10-17 MED ORDER — METOPROLOL TARTRATE 100 MG PO TABS: 100.0000 mg | ORAL_TABLET | Freq: Two times a day (BID) | ORAL | 5 refills | Status: DC

## 2017-10-17 MED ORDER — FLUTICASONE PROPIONATE 50 MCG/ACT NA SUSP: NASAL | 5 refills | Status: AC

## 2017-10-17 MED ORDER — FUROSEMIDE 40 MG PO TABS
40.0000 mg | ORAL_TABLET | Freq: Every day | ORAL | 5 refills | Status: DC | PRN
Start: 2017-10-17 — End: 2018-12-10

## 2017-10-17 MED ORDER — PREDNISONE 20 MG PO TABS: ORAL_TABLET | ORAL | 2 refills | Status: DC

## 2017-10-17 MED ORDER — TRAMADOL HCL 50 MG PO TABS
ORAL_TABLET | ORAL | 0 refills | Status: DC
Start: 2017-10-17 — End: 2018-05-17

## 2017-10-17 MED ORDER — AMLODIPINE BESYLATE 10 MG PO TABS: 10.0000 mg | ORAL_TABLET | Freq: Every day | ORAL | 4 refills | Status: DC

## 2017-10-17 MED ORDER — LIDOCAINE 5 % EX PTCH: MEDICATED_PATCH | CUTANEOUS | 5 refills | Status: DC

## 2017-10-17 MED ORDER — MELOXICAM 7.5 MG PO TABS: 7.5000 mg | ORAL_TABLET | Freq: Every day | ORAL | 5 refills | Status: DC | PRN

## 2017-10-17 MED ORDER — CLOBETASOL PROPIONATE 0.05 % EX CREA: TOPICAL_CREAM | Freq: Two times a day (BID) | CUTANEOUS | 5 refills | Status: DC | PRN

## 2017-10-17 NOTE — Patient Instructions (Signed)
Limit your sodium (Salt) intake  Please check your blood pressure on a regular basis.  If it is consistently greater than 150/90, please make an office appointment.  Behavioral health referral as discussed  Return in 6 months for follow-up with new provider  Radiation oncology and urology follow-up as scheduled

## 2017-10-17 NOTE — Progress Notes (Signed)
See progress note under physician encounter. 
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
## 2017-10-17 NOTE — Progress Notes (Signed)
Introduced myself to Luis Fisher and his wife [name] as the prostate nurse navigator and my role. He states he is not interested in surgery and is here today to discuss external beam radiation. He states he does not tolerate anesthesia well and he also has DISH- a bone disease that has limited his ROM in his neck. He has had 3 prostate biopsies and his last biopsy revealed a 3+4= 7. We discussed radiation and side effects. I gave them my business card and will continue to follow.

## 2017-10-18 ENCOUNTER — Other Ambulatory Visit (INDEPENDENT_AMBULATORY_CARE_PROVIDER_SITE_OTHER): Payer: Medicare Other

## 2017-10-18 DIAGNOSIS — E119 Type 2 diabetes mellitus without complications: Secondary | ICD-10-CM

## 2017-10-18 LAB — HEMOGLOBIN A1C: Hgb A1c MFr Bld: 5.5 % (ref 4.6–6.5)

## 2017-10-18 NOTE — Progress Notes (Signed)
A1c added! IS everything normal so that I can inform the pt

## 2017-10-20 NOTE — Progress Notes (Signed)
  Radiation Oncology         [phone]) [phone] ________________________________  Name: Luis Fisher MRN: [id number]  Date: 10/21/2017  DOB: 1955/07/05  SIMULATION AND TREATMENT PLANNING NOTE    ICD-10-CM   1. Prostate cancer ([place]) C61     DIAGNOSIS:  62 y.o. gentleman with favorable intermediate risk, Stage T1c adenocarcinoma of the prostate with Gleason Score of 3+4, and PSA of 9.9.   NARRATIVE:  The patient was brought to the [facility].  Identity was confirmed.  All relevant records and images related to the planned course of therapy were reviewed.  The patient freely provided informed written consent to proceed with treatment after reviewing the details related to the planned course of therapy. The consent form was witnessed and verified by the simulation staff.  Then, the patient was set-up in a stable reproducible supine position for radiation therapy.  A vacuum lock pillow device was custom fabricated to position his legs in a reproducible immobilized position.  Then, I performed a urethrogram under sterile conditions to identify the prostatic apex.  CT images were obtained.  Surface markings were placed.  The CT images were loaded into the planning software.  Then the prostate target and avoidance structures including the rectum, bladder, bowel and hips were contoured.  Treatment planning then occurred.  The radiation prescription was entered and confirmed.  A total of one complex treatment devices was fabricated. I have requested : Intensity Modulated Radiotherapy (IMRT) is medically necessary for this case for the following reason:  Rectal sparing.[name]  PLAN:  The patient will receive 70 Gy in 28 fractions.  ________________________________  Sheral Apley Tammi Klippel, M.D.

## 2017-10-21 ENCOUNTER — Ambulatory Visit
Admission: RE | Admit: 2017-10-21 | Discharge: 2017-10-21 | Disposition: A | Discharge status: Home / Self Care | Payer: Medicare Other | Source: Ambulatory Visit | Attending: Radiation Oncology | Admitting: Radiation Oncology

## 2017-10-21 ENCOUNTER — Encounter: Payer: Self-pay | Admitting: Medical Oncology

## 2017-10-21 DIAGNOSIS — C61 Malignant neoplasm of prostate: Secondary | ICD-10-CM | POA: Insufficient documentation

## 2017-10-21 DIAGNOSIS — Z510 Encounter for antineoplastic radiation therapy: Secondary | ICD-10-CM | POA: Insufficient documentation

## 2017-10-31 ENCOUNTER — Ambulatory Visit: Payer: Medicare Other

## 2017-10-31 DIAGNOSIS — Z510 Encounter for antineoplastic radiation therapy: Secondary | ICD-10-CM | POA: Diagnosis not present

## 2017-10-31 DIAGNOSIS — C61 Malignant neoplasm of prostate: Secondary | ICD-10-CM | POA: Diagnosis not present

## 2017-11-01 ENCOUNTER — Ambulatory Visit: Payer: Medicare Other

## 2017-11-02 ENCOUNTER — Ambulatory Visit: Payer: Medicare Other

## 2017-11-02 ENCOUNTER — Ambulatory Visit
Admission: RE | Admit: 2017-11-02 | Discharge: 2017-11-02 | Disposition: A | Discharge status: Home / Self Care | Payer: Medicare Other | Source: Ambulatory Visit | Attending: Radiation Oncology | Admitting: Radiation Oncology

## 2017-11-02 ENCOUNTER — Encounter: Payer: Self-pay | Admitting: Medical Oncology

## 2017-11-02 DIAGNOSIS — C61 Malignant neoplasm of prostate: Secondary | ICD-10-CM | POA: Diagnosis not present

## 2017-11-02 DIAGNOSIS — Z510 Encounter for antineoplastic radiation therapy: Secondary | ICD-10-CM | POA: Diagnosis not present

## 2017-11-03 ENCOUNTER — Ambulatory Visit: Payer: Medicare Other

## 2017-11-03 ENCOUNTER — Ambulatory Visit
Admission: RE | Admit: 2017-11-03 | Discharge: 2017-11-03 | Disposition: A | Discharge status: Home / Self Care | Payer: Medicare Other | Source: Ambulatory Visit | Attending: Radiation Oncology | Admitting: Radiation Oncology

## 2017-11-03 DIAGNOSIS — Z510 Encounter for antineoplastic radiation therapy: Secondary | ICD-10-CM | POA: Diagnosis not present

## 2017-11-03 DIAGNOSIS — C61 Malignant neoplasm of prostate: Secondary | ICD-10-CM | POA: Diagnosis not present

## 2017-11-04 ENCOUNTER — Ambulatory Visit: Payer: Medicare Other

## 2017-11-04 ENCOUNTER — Ambulatory Visit
Admission: RE | Admit: 2017-11-04 | Discharge: 2017-11-04 | Disposition: A | Discharge status: Home / Self Care | Payer: Medicare Other | Source: Ambulatory Visit | Attending: Radiation Oncology | Admitting: Radiation Oncology

## 2017-11-04 DIAGNOSIS — C61 Malignant neoplasm of prostate: Secondary | ICD-10-CM | POA: Diagnosis not present

## 2017-11-04 DIAGNOSIS — Z510 Encounter for antineoplastic radiation therapy: Secondary | ICD-10-CM | POA: Diagnosis not present

## 2017-11-04 NOTE — Progress Notes (Addendum)
Pt here for patient teaching.  Pt given Radiation and You booklet.  Reviewed areas of pertinence such as diarrhea, fatigue and urinary and bladder changes . Pt able to give teach back of use baby wipes, have Imodium on hand and drink plenty of water,. Pt demonstrated understanding, needs reinforcement, no evidence of learning, refused teaching and  of information given and will contact nursing with any questions or concerns.     [email or web address]/[email or web address]

## 2017-11-07 ENCOUNTER — Ambulatory Visit
Admission: RE | Admit: 2017-11-07 | Discharge: 2017-11-07 | Disposition: A | Discharge status: Home / Self Care | Payer: Medicare Other | Source: Ambulatory Visit | Attending: Radiation Oncology | Admitting: Radiation Oncology

## 2017-11-07 ENCOUNTER — Ambulatory Visit: Payer: Medicare Other

## 2017-11-07 DIAGNOSIS — C61 Malignant neoplasm of prostate: Secondary | ICD-10-CM | POA: Diagnosis not present

## 2017-11-07 DIAGNOSIS — Z510 Encounter for antineoplastic radiation therapy: Secondary | ICD-10-CM | POA: Diagnosis not present

## 2017-11-08 ENCOUNTER — Ambulatory Visit: Payer: Medicare Other

## 2017-11-08 ENCOUNTER — Ambulatory Visit
Admission: RE | Admit: 2017-11-08 | Discharge: 2017-11-08 | Disposition: A | Discharge status: Home / Self Care | Payer: Medicare Other | Source: Ambulatory Visit | Attending: Radiation Oncology | Admitting: Radiation Oncology

## 2017-11-08 DIAGNOSIS — Z510 Encounter for antineoplastic radiation therapy: Secondary | ICD-10-CM | POA: Diagnosis not present

## 2017-11-08 DIAGNOSIS — C61 Malignant neoplasm of prostate: Secondary | ICD-10-CM | POA: Diagnosis not present

## 2017-11-09 ENCOUNTER — Ambulatory Visit: Payer: Medicare Other

## 2017-11-09 ENCOUNTER — Ambulatory Visit
Admission: RE | Admit: 2017-11-09 | Discharge: 2017-11-09 | Disposition: A | Discharge status: Home / Self Care | Payer: Medicare Other | Source: Ambulatory Visit | Attending: Radiation Oncology | Admitting: Radiation Oncology

## 2017-11-09 DIAGNOSIS — C61 Malignant neoplasm of prostate: Secondary | ICD-10-CM | POA: Diagnosis not present

## 2017-11-09 DIAGNOSIS — Z510 Encounter for antineoplastic radiation therapy: Secondary | ICD-10-CM | POA: Diagnosis not present

## 2017-11-10 ENCOUNTER — Ambulatory Visit: Payer: Medicare Other

## 2017-11-10 ENCOUNTER — Ambulatory Visit
Admission: RE | Admit: 2017-11-10 | Discharge: 2017-11-10 | Disposition: A | Discharge status: Home / Self Care | Payer: Medicare Other | Source: Ambulatory Visit | Attending: Radiation Oncology | Admitting: Radiation Oncology

## 2017-11-10 DIAGNOSIS — Z510 Encounter for antineoplastic radiation therapy: Secondary | ICD-10-CM | POA: Diagnosis not present

## 2017-11-10 DIAGNOSIS — C61 Malignant neoplasm of prostate: Secondary | ICD-10-CM | POA: Diagnosis not present

## 2017-11-11 ENCOUNTER — Ambulatory Visit: Payer: Medicare Other

## 2017-11-11 ENCOUNTER — Ambulatory Visit
Admission: RE | Admit: 2017-11-11 | Discharge: 2017-11-11 | Disposition: A | Discharge status: Home / Self Care | Payer: Medicare Other | Source: Ambulatory Visit | Attending: Radiation Oncology | Admitting: Radiation Oncology

## 2017-11-11 DIAGNOSIS — Z510 Encounter for antineoplastic radiation therapy: Secondary | ICD-10-CM | POA: Diagnosis not present

## 2017-11-11 DIAGNOSIS — C61 Malignant neoplasm of prostate: Secondary | ICD-10-CM | POA: Diagnosis not present

## 2017-11-14 ENCOUNTER — Ambulatory Visit: Payer: Medicare Other

## 2017-11-14 ENCOUNTER — Ambulatory Visit
Admission: RE | Admit: 2017-11-14 | Discharge: 2017-11-14 | Disposition: A | Discharge status: Home / Self Care | Payer: Medicare Other | Source: Ambulatory Visit | Attending: Radiation Oncology | Admitting: Radiation Oncology

## 2017-11-14 DIAGNOSIS — Z510 Encounter for antineoplastic radiation therapy: Secondary | ICD-10-CM | POA: Diagnosis not present

## 2017-11-14 DIAGNOSIS — C61 Malignant neoplasm of prostate: Secondary | ICD-10-CM | POA: Diagnosis not present

## 2017-11-15 ENCOUNTER — Ambulatory Visit
Admission: RE | Admit: 2017-11-15 | Discharge: 2017-11-15 | Disposition: A | Discharge status: Home / Self Care | Payer: Medicare Other | Source: Ambulatory Visit | Attending: Radiation Oncology | Admitting: Radiation Oncology

## 2017-11-15 ENCOUNTER — Ambulatory Visit: Payer: Medicare Other

## 2017-11-15 DIAGNOSIS — C61 Malignant neoplasm of prostate: Secondary | ICD-10-CM | POA: Diagnosis not present

## 2017-11-15 DIAGNOSIS — Z510 Encounter for antineoplastic radiation therapy: Secondary | ICD-10-CM | POA: Diagnosis not present

## 2017-11-16 ENCOUNTER — Ambulatory Visit
Admission: RE | Admit: 2017-11-16 | Discharge: 2017-11-16 | Disposition: A | Discharge status: Home / Self Care | Payer: Medicare Other | Source: Ambulatory Visit | Attending: Radiation Oncology | Admitting: Radiation Oncology

## 2017-11-16 ENCOUNTER — Ambulatory Visit: Payer: Medicare Other

## 2017-11-16 DIAGNOSIS — Z510 Encounter for antineoplastic radiation therapy: Secondary | ICD-10-CM | POA: Diagnosis not present

## 2017-11-16 DIAGNOSIS — C61 Malignant neoplasm of prostate: Secondary | ICD-10-CM | POA: Diagnosis not present

## 2017-11-17 ENCOUNTER — Ambulatory Visit: Payer: Medicare Other

## 2017-11-17 ENCOUNTER — Ambulatory Visit
Admission: RE | Admit: 2017-11-17 | Discharge: 2017-11-17 | Disposition: A | Discharge status: Home / Self Care | Payer: Medicare Other | Source: Ambulatory Visit | Attending: Radiation Oncology | Admitting: Radiation Oncology

## 2017-11-17 DIAGNOSIS — C61 Malignant neoplasm of prostate: Secondary | ICD-10-CM | POA: Diagnosis not present

## 2017-11-18 ENCOUNTER — Ambulatory Visit: Payer: Medicare Other

## 2017-11-18 ENCOUNTER — Other Ambulatory Visit: Payer: Self-pay | Admitting: Radiation Oncology

## 2017-11-18 ENCOUNTER — Ambulatory Visit
Admission: RE | Admit: 2017-11-18 | Discharge: 2017-11-18 | Disposition: A | Discharge status: Home / Self Care | Payer: Medicare Other | Source: Ambulatory Visit | Attending: Radiation Oncology | Admitting: Radiation Oncology

## 2017-11-18 DIAGNOSIS — C61 Malignant neoplasm of prostate: Secondary | ICD-10-CM | POA: Diagnosis not present

## 2017-11-18 MED ORDER — TAMSULOSIN HCL 0.4 MG PO CAPS: 0.4000 mg | ORAL_CAPSULE | Freq: Every day | ORAL | 5 refills | Status: DC

## 2017-11-21 ENCOUNTER — Ambulatory Visit: Payer: Medicare Other

## 2017-11-21 ENCOUNTER — Ambulatory Visit
Admission: RE | Admit: 2017-11-21 | Discharge: 2017-11-21 | Disposition: A | Discharge status: Home / Self Care | Payer: Medicare Other | Source: Ambulatory Visit | Attending: Radiation Oncology | Admitting: Radiation Oncology

## 2017-11-21 DIAGNOSIS — C61 Malignant neoplasm of prostate: Secondary | ICD-10-CM | POA: Diagnosis not present

## 2017-11-22 ENCOUNTER — Ambulatory Visit
Admission: RE | Admit: 2017-11-22 | Discharge: 2017-11-22 | Disposition: A | Discharge status: Home / Self Care | Payer: Medicare Other | Source: Ambulatory Visit | Attending: Radiation Oncology | Admitting: Radiation Oncology

## 2017-11-22 ENCOUNTER — Ambulatory Visit: Payer: Medicare Other

## 2017-11-22 DIAGNOSIS — C61 Malignant neoplasm of prostate: Secondary | ICD-10-CM | POA: Diagnosis not present

## 2017-11-23 ENCOUNTER — Ambulatory Visit
Admission: RE | Admit: 2017-11-23 | Discharge: 2017-11-23 | Disposition: A | Discharge status: Home / Self Care | Payer: Medicare Other | Source: Ambulatory Visit | Attending: Radiation Oncology | Admitting: Radiation Oncology

## 2017-11-23 ENCOUNTER — Ambulatory Visit: Payer: Medicare Other

## 2017-11-23 DIAGNOSIS — C61 Malignant neoplasm of prostate: Secondary | ICD-10-CM | POA: Diagnosis not present

## 2017-11-24 ENCOUNTER — Ambulatory Visit
Admission: RE | Admit: 2017-11-24 | Discharge: 2017-11-24 | Disposition: A | Discharge status: Home / Self Care | Payer: Medicare Other | Source: Ambulatory Visit | Attending: Radiation Oncology | Admitting: Radiation Oncology

## 2017-11-24 ENCOUNTER — Ambulatory Visit: Payer: Medicare Other

## 2017-11-24 DIAGNOSIS — C61 Malignant neoplasm of prostate: Secondary | ICD-10-CM | POA: Diagnosis not present

## 2017-11-25 ENCOUNTER — Ambulatory Visit
Admission: RE | Admit: 2017-11-25 | Discharge: 2017-11-25 | Disposition: A | Discharge status: Home / Self Care | Payer: Medicare Other | Source: Ambulatory Visit | Attending: Radiation Oncology | Admitting: Radiation Oncology

## 2017-11-25 ENCOUNTER — Ambulatory Visit: Payer: Medicare Other

## 2017-11-25 DIAGNOSIS — C61 Malignant neoplasm of prostate: Secondary | ICD-10-CM | POA: Diagnosis not present

## 2017-11-28 ENCOUNTER — Ambulatory Visit: Payer: Medicare Other

## 2017-11-28 ENCOUNTER — Ambulatory Visit
Admission: RE | Admit: 2017-11-28 | Discharge: 2017-11-28 | Disposition: A | Discharge status: Home / Self Care | Payer: Medicare Other | Source: Ambulatory Visit | Attending: Radiation Oncology | Admitting: Radiation Oncology

## 2017-11-28 DIAGNOSIS — C61 Malignant neoplasm of prostate: Secondary | ICD-10-CM | POA: Diagnosis not present

## 2017-11-29 ENCOUNTER — Ambulatory Visit: Payer: Medicare Other

## 2017-11-29 ENCOUNTER — Ambulatory Visit
Admission: RE | Admit: 2017-11-29 | Discharge: 2017-11-29 | Disposition: A | Discharge status: Home / Self Care | Payer: Medicare Other | Source: Ambulatory Visit | Attending: Radiation Oncology | Admitting: Radiation Oncology

## 2017-11-29 DIAGNOSIS — C61 Malignant neoplasm of prostate: Secondary | ICD-10-CM | POA: Diagnosis not present

## 2017-11-30 ENCOUNTER — Ambulatory Visit: Payer: Medicare Other

## 2017-11-30 ENCOUNTER — Ambulatory Visit
Admission: RE | Admit: 2017-11-30 | Discharge: 2017-11-30 | Disposition: A | Discharge status: Home / Self Care | Payer: Medicare Other | Source: Ambulatory Visit | Attending: Radiation Oncology | Admitting: Radiation Oncology

## 2017-11-30 DIAGNOSIS — C61 Malignant neoplasm of prostate: Secondary | ICD-10-CM | POA: Diagnosis not present

## 2017-12-01 ENCOUNTER — Ambulatory Visit: Payer: Medicare Other

## 2017-12-01 ENCOUNTER — Ambulatory Visit
Admission: RE | Admit: 2017-12-01 | Discharge: 2017-12-01 | Disposition: A | Discharge status: Home / Self Care | Payer: Medicare Other | Source: Ambulatory Visit | Attending: Radiation Oncology | Admitting: Radiation Oncology

## 2017-12-01 DIAGNOSIS — C61 Malignant neoplasm of prostate: Secondary | ICD-10-CM | POA: Diagnosis not present

## 2017-12-02 ENCOUNTER — Ambulatory Visit: Payer: Medicare Other

## 2017-12-02 ENCOUNTER — Ambulatory Visit
Admission: RE | Admit: 2017-12-02 | Discharge: 2017-12-02 | Disposition: A | Discharge status: Home / Self Care | Payer: Medicare Other | Source: Ambulatory Visit | Attending: Radiation Oncology | Admitting: Radiation Oncology

## 2017-12-02 DIAGNOSIS — C61 Malignant neoplasm of prostate: Secondary | ICD-10-CM | POA: Diagnosis not present

## 2017-12-04 ENCOUNTER — Ambulatory Visit: Admission: RE | Admit: 2017-12-04 | Payer: Medicare Other | Source: Ambulatory Visit

## 2017-12-05 ENCOUNTER — Ambulatory Visit: Payer: Medicare Other

## 2017-12-05 ENCOUNTER — Ambulatory Visit
Admission: RE | Admit: 2017-12-05 | Discharge: 2017-12-05 | Disposition: A | Discharge status: Home / Self Care | Payer: Medicare Other | Source: Ambulatory Visit | Attending: Radiation Oncology | Admitting: Radiation Oncology

## 2017-12-05 DIAGNOSIS — C61 Malignant neoplasm of prostate: Secondary | ICD-10-CM | POA: Diagnosis not present

## 2017-12-06 ENCOUNTER — Ambulatory Visit: Payer: Medicare Other

## 2017-12-06 ENCOUNTER — Ambulatory Visit
Admission: RE | Admit: 2017-12-06 | Discharge: 2017-12-06 | Disposition: A | Discharge status: Home / Self Care | Payer: Medicare Other | Source: Ambulatory Visit | Attending: Radiation Oncology | Admitting: Radiation Oncology

## 2017-12-06 DIAGNOSIS — C61 Malignant neoplasm of prostate: Secondary | ICD-10-CM | POA: Diagnosis not present

## 2017-12-07 ENCOUNTER — Ambulatory Visit: Payer: Medicare Other

## 2017-12-07 ENCOUNTER — Ambulatory Visit
Admission: RE | Admit: 2017-12-07 | Discharge: 2017-12-07 | Disposition: A | Discharge status: Home / Self Care | Payer: Medicare Other | Source: Ambulatory Visit | Attending: Radiation Oncology | Admitting: Radiation Oncology

## 2017-12-07 DIAGNOSIS — C61 Malignant neoplasm of prostate: Secondary | ICD-10-CM | POA: Diagnosis not present

## 2017-12-08 ENCOUNTER — Ambulatory Visit
Admission: RE | Admit: 2017-12-08 | Discharge: 2017-12-08 | Disposition: A | Discharge status: Home / Self Care | Payer: Medicare Other | Source: Ambulatory Visit | Attending: Radiation Oncology | Admitting: Radiation Oncology

## 2017-12-08 ENCOUNTER — Ambulatory Visit: Payer: Medicare Other

## 2017-12-08 DIAGNOSIS — C61 Malignant neoplasm of prostate: Secondary | ICD-10-CM | POA: Diagnosis not present

## 2017-12-09 ENCOUNTER — Ambulatory Visit: Payer: Medicare Other

## 2017-12-09 ENCOUNTER — Encounter: Payer: Self-pay | Admitting: Medical Oncology

## 2017-12-09 ENCOUNTER — Ambulatory Visit
Admission: RE | Admit: 2017-12-09 | Discharge: 2017-12-09 | Disposition: A | Discharge status: Home / Self Care | Payer: Medicare Other | Source: Ambulatory Visit | Attending: Radiation Oncology | Admitting: Radiation Oncology

## 2017-12-09 DIAGNOSIS — C61 Malignant neoplasm of prostate: Secondary | ICD-10-CM | POA: Diagnosis not present

## 2017-12-12 ENCOUNTER — Encounter: Payer: Self-pay | Admitting: Radiation Oncology

## 2017-12-12 ENCOUNTER — Ambulatory Visit: Payer: Medicare Other

## 2017-12-12 NOTE — Progress Notes (Signed)
  Radiation Oncology         [phone]) [phone] ________________________________  Name: Luis Fisher MRN: [id number]  Date: 12/12/2017  DOB: 01/20/1956  End of Treatment Note  Diagnosis:   62 y.o. gentleman with favorable intermediate risk, Stage T1c adenocarcinoma of the prostate with Gleason Score of 3+4, and PSA of 9.9     Indication for treatment:  Curative, Definitive Radiotherapy       Radiation treatment dates:   11/02/17 - 12/09/17  Site/dose:   The prostate was treated to 70 Gy in 28 fractions of 2.5 Gy  Beams/energy:   The patient was treated with IMRT using volumetric arc therapy delivering 6 MV X-rays to clockwise and counterclockwise circumferential arcs with a 90 degree collimator offset to avoid dose scalloping.  Image guidance was performed with daily cone beam CT prior to each fraction to align to gold markers in the prostate and assure proper bladder and rectal fill volumes.  Immobilization was achieved with [employer] custom mold.  Narrative: The patient tolerated radiation treatment relatively well.  He reported loose stools, urgency, and moderate to severe fatigue, and denied pain, leakage, and hematuria throughout.  Towards the beginning of treatment, he noted irritation and burning, and hot flashes. He experienced an increase in nocturia, from 1x to up to 4x, and frequency, from 2x to 8-10x. He reported increased difficulty emptying his bladder, hesitancy, and mild dysuria as treatments went on.    Plan: The patient has completed radiation treatment. He will return to radiation oncology clinic for routine followup in one month. I advised him to call or return sooner if he has any questions or concerns related to his recovery or treatment. ________________________________  Sheral Apley. Tammi Klippel, M.D.  This document serves as a record of services personally performed by Tyler Pita, MD. It was created on his behalf by Wilburn Mylar, a trained medical scribe. The creation of this  record is based on the scribe's personal observations and the provider's statements to them. This document has been checked and approved by the attending provider.

## 2017-12-13 ENCOUNTER — Ambulatory Visit: Payer: Medicare Other

## 2017-12-14 ENCOUNTER — Ambulatory Visit: Payer: Medicare Other

## 2017-12-15 ENCOUNTER — Ambulatory Visit: Payer: Medicare Other

## 2017-12-16 ENCOUNTER — Ambulatory Visit: Payer: Medicare Other

## 2017-12-20 ENCOUNTER — Ambulatory Visit: Payer: Medicare Other

## 2017-12-21 ENCOUNTER — Ambulatory Visit: Payer: Medicare Other

## 2017-12-22 ENCOUNTER — Ambulatory Visit: Payer: Medicare Other

## 2018-01-17 ENCOUNTER — Ambulatory Visit: Payer: Self-pay | Admitting: Urology

## 2018-01-17 DIAGNOSIS — H2513 Age-related nuclear cataract, bilateral: Secondary | ICD-10-CM | POA: Diagnosis not present

## 2018-01-17 DIAGNOSIS — H43813 Vitreous degeneration, bilateral: Secondary | ICD-10-CM | POA: Diagnosis not present

## 2018-01-17 DIAGNOSIS — H02831 Dermatochalasis of right upper eyelid: Secondary | ICD-10-CM | POA: Diagnosis not present

## 2018-01-17 DIAGNOSIS — H524 Presbyopia: Secondary | ICD-10-CM | POA: Diagnosis not present

## 2018-01-19 ENCOUNTER — Ambulatory Visit
Admission: RE | Admit: 2018-01-19 | Discharge: 2018-01-19 | Disposition: A | Discharge status: Home / Self Care | Payer: Medicare Other | Source: Ambulatory Visit | Attending: Urology | Admitting: Urology

## 2018-01-19 ENCOUNTER — Encounter: Payer: Self-pay | Admitting: Urology

## 2018-01-19 ENCOUNTER — Other Ambulatory Visit: Payer: Self-pay

## 2018-01-19 VITALS — BP 130/77 | HR 79 | Temp 97.7°F | Wt 219.6 lb

## 2018-01-19 DIAGNOSIS — Z7982 Long term (current) use of aspirin: Secondary | ICD-10-CM | POA: Diagnosis not present

## 2018-01-19 DIAGNOSIS — C61 Malignant neoplasm of prostate: Secondary | ICD-10-CM | POA: Diagnosis not present

## 2018-01-19 DIAGNOSIS — Z923 Personal history of irradiation: Secondary | ICD-10-CM | POA: Insufficient documentation

## 2018-01-19 DIAGNOSIS — Z79899 Other long term (current) drug therapy: Secondary | ICD-10-CM | POA: Diagnosis not present

## 2018-01-19 NOTE — Progress Notes (Signed)
Radiation Oncology         [phone]) [phone] ________________________________  Name: Rickardo Brinegar MRN: [id number]  Date: 01/19/2018  DOB: 1955-05-10  Post Treatment Note  CC: Marletta Lor, MD  Irine Seal, MD  Diagnosis:   62 y.o. gentleman with favorable intermediate risk, Stage T1c adenocarcinoma of the prostate with Gleason Score of 3+4, and PSA of 9.9   Interval Since Last Radiation:  6 weeks  11/02/17 - 12/09/17:   The prostate was treated to 70 Gy in 28 fractions of 2.5 Gy  Narrative:  The patient returns today for routine follow-up.  He tolerated radiation treatment relatively well.  He reported loose stools, urgency, and moderate to severe fatigue, but denied pain, leakage, and hematuria throughout.  Towards the beginning of treatment, he noted irritation and burning, and hot flashes. He experienced an increase in nocturia, from 1x to up to 4x, and frequency, from 2x to 8-10x. He reported increased difficulty emptying his bladder, hesitancy, and mild dysuria as treatments went on.                                On review of systems, the patient states that he is doing very well overall.  He has noted significant improvement in his lower urinary tract symptoms over the past 1-2 weeks.  He continues with occasional bouts of frequent, loose stools up to 8 times per day and improves with Imodium as needed.  He specifically denies dysuria, gross hematuria, excessive daytime frequency, weak stream, straining to void or incomplete bladder emptying.  He has nocturia x2/night and mild increased urgency but denies urinary incontinence.  His current IPSS score is 11 indicating mild to moderate urinary symptoms.  His pretreatment IPSS score was 1.  He continues with modest fatigue which he feels is gradually improving as well.  Overall, he is quite pleased with his progress to date.  ALLERGIES:  is allergic to atorvastatin and contrast media [iodinated diagnostic agents].  Meds: Current Outpatient  Medications  Medication Sig Dispense Refill  . amLODipine (NORVASC) 10 MG tablet Take 1 tablet (10 mg total) by mouth daily. 90 tablet 4  . aspirin 325 MG tablet Take 325 mg by mouth daily. Reports taking aspirin 2-3 times per week    . clobetasol cream (TEMOVATE) 0.05 % Apply topically 2 (two) times daily as needed. 60 g 5  . fluticasone (FLONASE) 50 MCG/ACT nasal spray USE 1-2 SPRAYS IN EACH NOSTRIL DAILY AS NEEDED. 16 g 5  . furosemide (LASIX) 40 MG tablet Take 1 tablet (40 mg total) by mouth daily as needed. 90 tablet 5  . lidocaine (LIDODERM) 5 % APPLY 1 PATCH TO THE SKIN DAILY. REMOVE & DISCARD PATCH WITHIN 12 HOURS OR AS DIRECTED BY MD. 30 patch 5  . meloxicam (MOBIC) 7.5 MG tablet Take 1 tablet (7.5 mg total) by mouth daily as needed. 90 tablet 5  . metoprolol tartrate (LOPRESSOR) 100 MG tablet Take 1 tablet (100 mg total) by mouth 2 (two) times daily. 180 tablet 5  . predniSONE (DELTASONE) 20 MG tablet 1 tablet twice daily as needed for arthritis flare 30 tablet 2  . tamsulosin (FLOMAX) 0.4 MG CAPS capsule Take 1 capsule (0.4 mg total) by mouth daily after supper. 30 capsule 5  . traMADol (ULTRAM) 50 MG tablet TAKE 1 TABLET EVERY SIX HOURS AS NEEDED FOR PAIN. 90 tablet 0   No current facility-administered medications for this  encounter.     Physical Findings:  weight is 219 lb 9.6 oz (99.6 kg). His oral temperature is 97.7 F (36.5 C). His blood pressure is 130/77 and his pulse is 79. His oxygen saturation is 97%.  Pain Assessment Pain Score: 4  Pain Loc: (Bones)/10 In general this is a well appearing Caucasian male in no acute distress.  He's alert and oriented x4 and appropriate throughout the examination. Cardiopulmonary assessment is negative for acute distress and he exhibits normal effort.   Lab Findings: Lab Results  Component Value Date   WBC 8.1 10/17/2017   HGB 15.7 10/17/2017   HCT 45.8 10/17/2017   MCV 93.9 10/17/2017   PLT 267.0 10/17/2017     Radiographic  Findings: No results found.  Impression/Plan: 1. 62 y.o. gentleman with favorable intermediate risk, Stage T1c adenocarcinoma of the prostate with Gleason Score of 3+4, and PSA of 9.9.   He will continue to follow up with urology for ongoing PSA determinations and has an appointment scheduled with Dr. Jeffie Pollock on 04/05/18. He understands what to expect with regards to PSA monitoring going forward. I will look forward to following his response to treatment via correspondence with urology, and would be happy to continue to participate in his care if clinically indicated. I talked to the patient about what to expect in the future, including his risk for erectile dysfunction and rectal bleeding. I encouraged him to call or return to the office if he has any questions regarding his previous radiation or possible radiation side effects. He was comfortable with this plan and will follow up as needed.    Nicholos Johns, PA-C

## 2018-03-27 ENCOUNTER — Other Ambulatory Visit: Payer: Self-pay | Admitting: Internal Medicine

## 2018-03-27 DIAGNOSIS — I10 Essential (primary) hypertension: Secondary | ICD-10-CM

## 2018-04-05 ENCOUNTER — Encounter: Payer: Self-pay | Admitting: Family Medicine

## 2018-04-05 ENCOUNTER — Other Ambulatory Visit: Payer: Self-pay

## 2018-04-05 ENCOUNTER — Ambulatory Visit (INDEPENDENT_AMBULATORY_CARE_PROVIDER_SITE_OTHER): Payer: Medicare Other | Admitting: Family Medicine

## 2018-04-05 VITALS — BP 140/70 | HR 91 | Temp 98.6°F | Ht 68.0 in | Wt 215.5 lb

## 2018-04-05 DIAGNOSIS — M4810 Ankylosing hyperostosis [Forestier], site unspecified: Secondary | ICD-10-CM | POA: Diagnosis not present

## 2018-04-05 DIAGNOSIS — N200 Calculus of kidney: Secondary | ICD-10-CM | POA: Diagnosis not present

## 2018-04-05 DIAGNOSIS — R3915 Urgency of urination: Secondary | ICD-10-CM | POA: Diagnosis not present

## 2018-04-05 DIAGNOSIS — E785 Hyperlipidemia, unspecified: Secondary | ICD-10-CM | POA: Diagnosis not present

## 2018-04-05 DIAGNOSIS — C61 Malignant neoplasm of prostate: Secondary | ICD-10-CM | POA: Diagnosis not present

## 2018-04-05 DIAGNOSIS — I10 Essential (primary) hypertension: Secondary | ICD-10-CM | POA: Diagnosis not present

## 2018-04-05 DIAGNOSIS — R350 Frequency of micturition: Secondary | ICD-10-CM | POA: Diagnosis not present

## 2018-04-05 MED ORDER — PREDNISONE 20 MG PO TABS: ORAL_TABLET | ORAL | 2 refills | Status: DC
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
## 2018-04-10 ENCOUNTER — Encounter: Payer: Self-pay | Admitting: Family Medicine

## 2018-05-09 ENCOUNTER — Other Ambulatory Visit: Payer: Self-pay | Admitting: Internal Medicine

## 2018-05-09 ENCOUNTER — Other Ambulatory Visit: Payer: Self-pay | Admitting: Family Medicine

## 2018-05-09 DIAGNOSIS — I10 Essential (primary) hypertension: Secondary | ICD-10-CM

## 2018-05-16 NOTE — Telephone Encounter (Signed)
Last fill 10/17/17 Last OV 04/05/18  Ok to fill?

## 2018-05-16 NOTE — Telephone Encounter (Signed)
Patient wife is calling to follow up on this.

## 2018-05-17 ENCOUNTER — Encounter: Payer: Self-pay | Admitting: Family Medicine

## 2018-05-17 ENCOUNTER — Other Ambulatory Visit: Payer: Self-pay | Admitting: Family Medicine

## 2018-05-17 DIAGNOSIS — C61 Malignant neoplasm of prostate: Secondary | ICD-10-CM

## 2018-05-17 DIAGNOSIS — I10 Essential (primary) hypertension: Secondary | ICD-10-CM

## 2018-05-17 MED ORDER — TRAMADOL HCL 50 MG PO TABS: ORAL_TABLET | ORAL | 0 refills | Status: DC

## 2018-05-18 ENCOUNTER — Other Ambulatory Visit (INDEPENDENT_AMBULATORY_CARE_PROVIDER_SITE_OTHER): Payer: Medicare Other

## 2018-05-18 DIAGNOSIS — C61 Malignant neoplasm of prostate: Secondary | ICD-10-CM

## 2018-05-18 DIAGNOSIS — I10 Essential (primary) hypertension: Secondary | ICD-10-CM

## 2018-05-18 DIAGNOSIS — E785 Hyperlipidemia, unspecified: Secondary | ICD-10-CM | POA: Diagnosis not present

## 2018-05-18 LAB — CBC WITH DIFFERENTIAL/PLATELET
Basophils Absolute: 0 10*3/uL (ref 0.0–0.1)
Basophils Relative: 0.7 % (ref 0.0–3.0)
Eosinophils Absolute: 0.1 10*3/uL (ref 0.0–0.7)
Eosinophils Relative: 1.4 % (ref 0.0–5.0)
HEMATOCRIT: 43.8 % (ref 39.0–52.0)
Hemoglobin: 15 g/dL (ref 13.0–17.0)
Lymphocytes Relative: 30.3 % (ref 12.0–46.0)
Lymphs Abs: 1.3 10*3/uL (ref 0.7–4.0)
MCHC: 34.3 g/dL (ref 30.0–36.0)
MCV: 95 fl (ref 78.0–100.0)
Monocytes Absolute: 0.4 10*3/uL (ref 0.1–1.0)
Monocytes Relative: 8.3 % (ref 3.0–12.0)
Neutro Abs: 2.6 10*3/uL (ref 1.4–7.7)
Neutrophils Relative %: 59.3 % (ref 43.0–77.0)
Platelets: 249 10*3/uL (ref 150.0–400.0)
RBC: 4.61 Mil/uL (ref 4.22–5.81)
RDW: 13.1 % (ref 11.5–15.5)
WBC: 4.4 10*3/uL (ref 4.0–10.5)

## 2018-05-18 LAB — LIPID PANEL
Cholesterol: 228 mg/dL — ABNORMAL HIGH (ref 0–200)
HDL: 46 mg/dL (ref >39.00)
NonHDL: 181.85
Total CHOL/HDL Ratio: 5
Triglycerides: 235 mg/dL — ABNORMAL HIGH (ref 0.0–149.0)
VLDL: 47 mg/dL — ABNORMAL HIGH (ref 0.0–40.0)

## 2018-05-18 LAB — COMPREHENSIVE METABOLIC PANEL
ALT: 34 U/L (ref 0–53)
AST: 26 U/L (ref 0–37)
Albumin: 4.3 g/dL (ref 3.5–5.2)
Alkaline Phosphatase: 16 U/L — ABNORMAL LOW (ref 39–117)
BUN: 11 mg/dL (ref 6–23)
CALCIUM: 9.5 mg/dL (ref 8.4–10.5)
CO2: 27 mEq/L (ref 19–32)
CREATININE: 0.96 mg/dL (ref 0.40–1.50)
Chloride: 101 mEq/L (ref 96–112)
GFR: 78.63 mL/min (ref >60.00)
Glucose, Bld: 81 mg/dL (ref 70–99)
Potassium: 4.2 mEq/L (ref 3.5–5.1)
Sodium: 141 mEq/L (ref 135–145)
Total Bilirubin: 0.7 mg/dL (ref 0.2–1.2)
Total Protein: 6.5 g/dL (ref 6.0–8.3)

## 2018-05-18 LAB — TESTOSTERONE: Testosterone: 183.97 ng/dL — ABNORMAL LOW (ref 300.00–890.00)

## 2018-05-18 LAB — PSA: PSA: 1.06 ng/mL (ref 0.10–4.00)

## 2018-05-18 LAB — LDL CHOLESTEROL, DIRECT: Direct LDL: 153 mg/dL

## 2018-06-13 ENCOUNTER — Other Ambulatory Visit: Payer: Self-pay | Admitting: Family Medicine

## 2018-06-13 DIAGNOSIS — I10 Essential (primary) hypertension: Secondary | ICD-10-CM

## 2018-06-13 NOTE — Telephone Encounter (Signed)
Both last filled 10/17/17 by Dr. Raliegh Ip Last OV 04/05/18  Ok to fill?

## 2018-10-05 DIAGNOSIS — C61 Malignant neoplasm of prostate: Secondary | ICD-10-CM | POA: Diagnosis not present

## 2018-10-24 ENCOUNTER — Encounter: Payer: Self-pay | Admitting: Family Medicine

## 2018-10-24 ENCOUNTER — Other Ambulatory Visit: Payer: Self-pay

## 2018-10-24 ENCOUNTER — Ambulatory Visit (INDEPENDENT_AMBULATORY_CARE_PROVIDER_SITE_OTHER): Payer: Medicare Other | Admitting: Family Medicine

## 2018-10-24 VITALS — BP 135/78

## 2018-10-24 DIAGNOSIS — H9311 Tinnitus, right ear: Secondary | ICD-10-CM | POA: Diagnosis not present

## 2018-10-24 DIAGNOSIS — R0602 Shortness of breath: Secondary | ICD-10-CM

## 2018-10-24 DIAGNOSIS — I10 Essential (primary) hypertension: Secondary | ICD-10-CM

## 2018-10-24 DIAGNOSIS — I493 Ventricular premature depolarization: Secondary | ICD-10-CM | POA: Diagnosis not present
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
## 2018-10-25 ENCOUNTER — Other Ambulatory Visit (INDEPENDENT_AMBULATORY_CARE_PROVIDER_SITE_OTHER): Payer: Medicare Other | Admitting: *Deleted

## 2018-10-25 ENCOUNTER — Other Ambulatory Visit (INDEPENDENT_AMBULATORY_CARE_PROVIDER_SITE_OTHER): Payer: Medicare Other

## 2018-10-25 ENCOUNTER — Ambulatory Visit (INDEPENDENT_AMBULATORY_CARE_PROVIDER_SITE_OTHER): Payer: Medicare Other

## 2018-10-25 ENCOUNTER — Other Ambulatory Visit: Payer: Self-pay

## 2018-10-25 DIAGNOSIS — R0602 Shortness of breath: Secondary | ICD-10-CM | POA: Diagnosis not present

## 2018-10-25 LAB — CBC
HCT: 41.7 % (ref 39.0–52.0)
Hemoglobin: 14.2 g/dL (ref 13.0–17.0)
MCHC: 34.2 g/dL (ref 30.0–36.0)
MCV: 95 fl (ref 78.0–100.0)
Platelets: 240 10*3/uL (ref 150.0–400.0)
RBC: 4.39 Mil/uL (ref 4.22–5.81)
RDW: 13.6 % (ref 11.5–15.5)
WBC: 7 10*3/uL (ref 4.0–10.5)

## 2018-10-25 LAB — BASIC METABOLIC PANEL
BUN: 11 mg/dL (ref 6–23)
CO2: 26 mEq/L (ref 19–32)
Calcium: 9.3 mg/dL (ref 8.4–10.5)
Chloride: 102 mEq/L (ref 96–112)
Creatinine, Ser: 0.98 mg/dL (ref 0.40–1.50)
GFR: 76.67 mL/min (ref >60.00)
Glucose, Bld: 104 mg/dL — ABNORMAL HIGH (ref 70–99)
Potassium: 4.1 mEq/L (ref 3.5–5.1)
Sodium: 140 mEq/L (ref 135–145)

## 2018-10-25 NOTE — Addendum Note (Signed)
Addended by: Zacarias Pontes on: 10/25/2018 02:09 PM   Modules accepted: Orders

## 2018-10-26 ENCOUNTER — Encounter: Payer: Self-pay | Admitting: Family Medicine

## 2018-10-27 ENCOUNTER — Encounter: Payer: Self-pay | Admitting: Family Medicine

## 2018-11-20 ENCOUNTER — Encounter: Payer: Self-pay | Admitting: Gastroenterology

## 2018-12-10 ENCOUNTER — Ambulatory Visit (INDEPENDENT_AMBULATORY_CARE_PROVIDER_SITE_OTHER)
Admission: RE | Admit: 2018-12-10 | Discharge: 2018-12-10 | Disposition: A | Discharge status: Home / Self Care | Payer: Medicare Other | Source: Ambulatory Visit

## 2018-12-10 DIAGNOSIS — K5732 Diverticulitis of large intestine without perforation or abscess without bleeding: Secondary | ICD-10-CM | POA: Diagnosis not present

## 2018-12-10 MED ORDER — ONDANSETRON 4 MG PO TBDP: 4.0000 mg | ORAL_TABLET | Freq: Three times a day (TID) | ORAL | 0 refills | Status: DC | PRN

## 2018-12-10 MED ORDER — AMOXICILLIN-POT CLAVULANATE 875-125 MG PO TABS: 1.0000 | ORAL_TABLET | Freq: Two times a day (BID) | ORAL | 0 refills | Status: DC
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
## 2018-12-10 NOTE — Discharge Instructions (Signed)
Start augmentin as directed. Zofran as needed for nausea. If noticing worsening abdominal pain, vomiting despite zofran use, fever, will need in person evaluation.

## 2019-01-23 DIAGNOSIS — H43813 Vitreous degeneration, bilateral: Secondary | ICD-10-CM | POA: Diagnosis not present

## 2019-01-23 DIAGNOSIS — H2513 Age-related nuclear cataract, bilateral: Secondary | ICD-10-CM | POA: Diagnosis not present

## 2019-01-23 DIAGNOSIS — H524 Presbyopia: Secondary | ICD-10-CM | POA: Diagnosis not present

## 2019-01-23 DIAGNOSIS — H52203 Unspecified astigmatism, bilateral: Secondary | ICD-10-CM | POA: Diagnosis not present

## 2019-02-14 DIAGNOSIS — Z23 Encounter for immunization: Secondary | ICD-10-CM | POA: Diagnosis not present

## 2019-02-21 ENCOUNTER — Other Ambulatory Visit: Payer: Self-pay | Admitting: Family Medicine

## 2019-02-21 DIAGNOSIS — M4810 Ankylosing hyperostosis [Forestier], site unspecified: Secondary | ICD-10-CM

## 2019-02-21 DIAGNOSIS — I10 Essential (primary) hypertension: Secondary | ICD-10-CM

## 2019-02-21 MED ORDER — METOPROLOL TARTRATE 100 MG PO TABS: 100.0000 mg | ORAL_TABLET | Freq: Two times a day (BID) | ORAL | 1 refills | Status: DC

## 2019-02-21 MED ORDER — AMLODIPINE BESYLATE 10 MG PO TABS: ORAL_TABLET | ORAL | 1 refills | Status: DC

## 2019-02-21 NOTE — Telephone Encounter (Signed)
Requested medication (s) are due for refill today: no  Requested medication (s) are on the active medication list: yes  Last refill:  04/05/2018  Future visit scheduled:no  Notes to clinic:  Refill cannot be delegated    Requested Prescriptions  Pending Prescriptions Disp Refills   predniSONE (DELTASONE) 20 MG tablet 30 tablet 2    Sig: 1 tablet twice daily as needed for arthritis flare     Not Delegated - Endocrinology:  Oral Corticosteroids Failed - 02/21/2019 10:31 AM      Failed - This refill cannot be delegated      Passed - Last BP in normal range    BP Readings from Last 1 Encounters:  10/24/18 135/78         Passed - Valid encounter within last 6 months    Recent Outpatient Visits          4 months ago Shortness of breath   Therapist, music at [facility] [place], Malka So, MD   10 months ago DISH (diffuse idiopathic skeletal hyperostosis)   Therapist, music at [facility], Steele Berg, MD   1 year ago Essential hypertension   Therapist, music at [facility], Doretha Sou, MD   1 year ago Essential hypertension   Therapist, music at [facility], Doretha Sou, MD   2 years ago Essential hypertension   Therapist, music at [facility], Doretha Sou, MD             Signed Prescriptions Disp Refills   amLODipine (NORVASC) 10 MG tablet 90 tablet 1    Sig: TAKE 1 TABLET EACH DAY.     Cardiovascular:  Calcium Channel Blockers Passed - 02/21/2019 10:31 AM      Passed - Last BP in normal range    BP Readings from Last 1 Encounters:  10/24/18 135/78         Passed - Valid encounter within last 6 months    Recent Outpatient Visits          4 months ago Shortness of breath   Therapist, music at [facility] [place], Malka So, MD   10 months ago DISH (diffuse idiopathic skeletal hyperostosis)   Therapist, music at [facility], Steele Berg, MD   1 year ago Essential hypertension   Therapist, music at [facility],  Doretha Sou, MD   1 year ago Essential hypertension   Therapist, music at [facility], Doretha Sou, MD   2 years ago Essential hypertension   Therapist, music at [facility], Doretha Sou, MD              metoprolol tartrate (LOPRESSOR) 100 MG tablet 180 tablet 1    Sig: Take 1 tablet (100 mg total) by mouth 2 (two) times daily.     Cardiovascular:  Beta Blockers Passed - 02/21/2019 10:31 AM      Passed - Last BP in normal range    BP Readings from Last 1 Encounters:  10/24/18 135/78         Passed - Last Heart Rate in normal range    Pulse Readings from Last 1 Encounters:  04/05/18 91         Passed - Valid encounter within last 6 months    Recent Outpatient Visits          4 months ago Shortness of breath   Therapist, music at [facility] [place], Malka So, MD   10 months ago DISH (diffuse idiopathic skeletal hyperostosis)   Therapist, music at [facility],  Steele Berg, MD   1 year ago Essential hypertension   Therapist, music at [facility], Doretha Sou, MD   1 year ago Essential hypertension   Therapist, music at [facility], Doretha Sou, MD   2 years ago Essential hypertension   Therapist, music at [facility], Doretha Sou, MD

## 2019-02-21 NOTE — Telephone Encounter (Signed)
Medication Refill - Medication: metoprolol tartrate (LOPRESSOR) 100 MG tablet, amLODipine (NORVASC) 10 MG tablet, predniSONE (DELTASONE) 20 MG tablet    Has the patient contacted their pharmacy? Yes.   Pt called and is requesting to have a 90 day supply of these medications. Please advise.  (Agent: If no, request that the patient contact the pharmacy for the refill.) (Agent: If yes, when and what did the pharmacy advise?)  Preferred Pharmacy (with phone number or street name):  [facility] [address] [place]  [address] [place] [address]  Phone: [phone] Fax: [phone]  Not a 24 hour pharmacy; exact hours not known.     Agent: Please be advised that RX refills may take up to 3 business days. We ask that you follow-up with your pharmacy.

## 2019-02-22 MED ORDER — PREDNISONE 20 MG PO TABS: ORAL_TABLET | ORAL | 2 refills | Status: DC

## 2019-03-14 ENCOUNTER — Other Ambulatory Visit: Payer: Self-pay

## 2019-03-30 ENCOUNTER — Other Ambulatory Visit: Payer: Medicare Other

## 2019-03-30 ENCOUNTER — Telehealth (INDEPENDENT_AMBULATORY_CARE_PROVIDER_SITE_OTHER): Payer: Medicare Other | Admitting: Family Medicine

## 2019-03-30 DIAGNOSIS — R1032 Left lower quadrant pain: Secondary | ICD-10-CM | POA: Diagnosis not present

## 2019-03-30 DIAGNOSIS — Z8719 Personal history of other diseases of the digestive system: Secondary | ICD-10-CM

## 2019-03-30 MED ORDER — AMOXICILLIN-POT CLAVULANATE 875-125 MG PO TABS: 1.0000 | ORAL_TABLET | Freq: Two times a day (BID) | ORAL | 0 refills | Status: AC
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
## 2019-04-02 DIAGNOSIS — R972 Elevated prostate specific antigen [PSA]: Secondary | ICD-10-CM | POA: Diagnosis not present

## 2019-04-09 DIAGNOSIS — E230 Hypopituitarism: Secondary | ICD-10-CM | POA: Diagnosis not present

## 2019-06-08 ENCOUNTER — Other Ambulatory Visit: Payer: Self-pay

## 2019-06-11 ENCOUNTER — Ambulatory Visit (INDEPENDENT_AMBULATORY_CARE_PROVIDER_SITE_OTHER): Payer: Medicare Other | Admitting: Family Medicine

## 2019-06-11 ENCOUNTER — Other Ambulatory Visit: Payer: Self-pay

## 2019-06-11 ENCOUNTER — Encounter: Payer: Self-pay | Admitting: Family Medicine

## 2019-06-11 VITALS — BP 124/64 | HR 97 | Temp 97.7°F | Ht 69.0 in | Wt 228.8 lb

## 2019-06-11 DIAGNOSIS — M459 Ankylosing spondylitis of unspecified sites in spine: Secondary | ICD-10-CM

## 2019-06-11 DIAGNOSIS — M4810 Ankylosing hyperostosis [Forestier], site unspecified: Secondary | ICD-10-CM | POA: Diagnosis not present

## 2019-06-11 DIAGNOSIS — I10 Essential (primary) hypertension: Secondary | ICD-10-CM

## 2019-06-11 DIAGNOSIS — C61 Malignant neoplasm of prostate: Secondary | ICD-10-CM | POA: Diagnosis not present

## 2019-06-11 DIAGNOSIS — M2550 Pain in unspecified joint: Secondary | ICD-10-CM

## 2019-06-11 DIAGNOSIS — E785 Hyperlipidemia, unspecified: Secondary | ICD-10-CM

## 2019-06-11 LAB — CBC WITH DIFFERENTIAL/PLATELET
Basophils Absolute: 0 10*3/uL (ref 0.0–0.1)
Basophils Relative: 0.5 % (ref 0.0–3.0)
Eosinophils Absolute: 0.1 10*3/uL (ref 0.0–0.7)
Eosinophils Relative: 1.6 % (ref 0.0–5.0)
HCT: 45.5 % (ref 39.0–52.0)
Hemoglobin: 15.5 g/dL (ref 13.0–17.0)
Lymphocytes Relative: 20.2 % (ref 12.0–46.0)
Lymphs Abs: 1.3 10*3/uL (ref 0.7–4.0)
MCHC: 34.1 g/dL (ref 30.0–36.0)
MCV: 94.8 fl (ref 78.0–100.0)
Monocytes Absolute: 0.6 10*3/uL (ref 0.1–1.0)
Monocytes Relative: 8.8 % (ref 3.0–12.0)
Neutro Abs: 4.5 10*3/uL (ref 1.4–7.7)
Neutrophils Relative %: 68.9 % (ref 43.0–77.0)
Platelets: 216 10*3/uL (ref 150.0–400.0)
RBC: 4.8 Mil/uL (ref 4.22–5.81)
RDW: 13.6 % (ref 11.5–15.5)
WBC: 6.5 10*3/uL (ref 4.0–10.5)

## 2019-06-11 LAB — PSA: PSA: 1.22 ng/mL (ref 0.10–4.00)

## 2019-06-11 LAB — VITAMIN D 25 HYDROXY (VIT D DEFICIENCY, FRACTURES): VITD: 18.32 ng/mL — ABNORMAL LOW (ref 30.00–100.00)

## 2019-06-11 LAB — SEDIMENTATION RATE: Sed Rate: 28 mm/hr — ABNORMAL HIGH (ref 0–20)

## 2019-06-11 LAB — TSH: TSH: 1.79 u[IU]/mL (ref 0.35–4.50)

## 2019-06-11 MED ORDER — AMOXICILLIN-POT CLAVULANATE 875-125 MG PO TABS: 1.0000 | ORAL_TABLET | Freq: Two times a day (BID) | ORAL | 0 refills | Status: DC

## 2019-06-11 MED ORDER — MELOXICAM 7.5 MG PO TABS: 7.5000 mg | ORAL_TABLET | Freq: Every day | ORAL | 0 refills | Status: DC

## 2019-06-11 MED ORDER — HYDROCODONE-ACETAMINOPHEN 5-325 MG PO TABS: 1.0000 | ORAL_TABLET | Freq: Two times a day (BID) | ORAL | 0 refills | Status: DC | PRN

## 2019-06-11 MED ORDER — TRAMADOL HCL 50 MG PO TABS: ORAL_TABLET | ORAL | 1 refills | Status: DC
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
## 2019-06-12 ENCOUNTER — Other Ambulatory Visit: Payer: Self-pay | Admitting: Family Medicine

## 2019-06-12 DIAGNOSIS — I10 Essential (primary) hypertension: Secondary | ICD-10-CM

## 2019-06-12 LAB — COMPREHENSIVE METABOLIC PANEL
ALT: 31 U/L (ref 0–53)
AST: 24 U/L (ref 0–37)
Albumin: 4.6 g/dL (ref 3.5–5.2)
Alkaline Phosphatase: 19 U/L — ABNORMAL LOW (ref 39–117)
BUN: 17 mg/dL (ref 6–23)
CO2: 28 mEq/L (ref 19–32)
Calcium: 10.1 mg/dL (ref 8.4–10.5)
Chloride: 100 mEq/L (ref 96–112)
Creatinine, Ser: 1.07 mg/dL (ref 0.40–1.50)
GFR: 69.15 mL/min (ref >60.00)
Glucose, Bld: 166 mg/dL — ABNORMAL HIGH (ref 70–99)
Potassium: 4.2 mEq/L (ref 3.5–5.1)
Sodium: 139 mEq/L (ref 135–145)
Total Bilirubin: 0.7 mg/dL (ref 0.2–1.2)
Total Protein: 6.9 g/dL (ref 6.0–8.3)

## 2019-06-12 LAB — URIC ACID: Uric Acid, Serum: 8 mg/dL — ABNORMAL HIGH (ref 4.0–7.8)

## 2019-06-12 LAB — C-REACTIVE PROTEIN: CRP: 1.6 mg/dL (ref 0.5–20.0)

## 2019-06-12 LAB — LDL CHOLESTEROL, DIRECT: Direct LDL: 140 mg/dL

## 2019-06-12 LAB — LIPID PANEL
Cholesterol: 247 mg/dL — ABNORMAL HIGH (ref 0–200)
HDL: 52.2 mg/dL (ref >39.00)
Total CHOL/HDL Ratio: 5
Triglycerides: 522 mg/dL — ABNORMAL HIGH (ref 0.0–149.0)

## 2019-06-13 ENCOUNTER — Other Ambulatory Visit (INDEPENDENT_AMBULATORY_CARE_PROVIDER_SITE_OTHER): Payer: Medicare Other

## 2019-06-13 DIAGNOSIS — R7301 Impaired fasting glucose: Secondary | ICD-10-CM

## 2019-06-13 LAB — ANA: Anti Nuclear Antibody (ANA): NEGATIVE

## 2019-06-13 LAB — RHEUMATOID FACTOR: Rheumatoid fact SerPl-aCnc: <14 IU/mL (ref <14)

## 2019-06-13 LAB — HLA-B27 ANTIGEN: HLA-B27 Antigen: POSITIVE — AB

## 2019-06-14 LAB — HEMOGLOBIN A1C: Hgb A1c MFr Bld: 5.4 % (ref 4.6–6.5)

## 2019-06-15 MED ORDER — ALLOPURINOL 100 MG PO TABS: 100.0000 mg | ORAL_TABLET | Freq: Every day | ORAL | 0 refills | Status: DC

## 2019-06-15 NOTE — Addendum Note (Signed)
Addended by: Agnes Lawrence on: 06/15/2019 03:58 PM   Modules accepted: Orders

## 2019-06-20 DIAGNOSIS — D224 Melanocytic nevi of scalp and neck: Secondary | ICD-10-CM | POA: Diagnosis not present

## 2019-06-20 DIAGNOSIS — L603 Nail dystrophy: Secondary | ICD-10-CM | POA: Diagnosis not present

## 2019-06-20 DIAGNOSIS — L218 Other seborrheic dermatitis: Secondary | ICD-10-CM | POA: Diagnosis not present

## 2019-06-20 DIAGNOSIS — D692 Other nonthrombocytopenic purpura: Secondary | ICD-10-CM | POA: Diagnosis not present

## 2019-09-13 ENCOUNTER — Other Ambulatory Visit: Payer: Self-pay

## 2019-09-14 ENCOUNTER — Ambulatory Visit (INDEPENDENT_AMBULATORY_CARE_PROVIDER_SITE_OTHER): Payer: Medicare Other | Admitting: Family Medicine

## 2019-09-14 ENCOUNTER — Encounter: Payer: Self-pay | Admitting: Family Medicine

## 2019-09-14 VITALS — BP 128/82 | HR 88 | Temp 97.9°F | Ht 69.0 in | Wt 227.3 lb

## 2019-09-14 DIAGNOSIS — E790 Hyperuricemia without signs of inflammatory arthritis and tophaceous disease: Secondary | ICD-10-CM | POA: Diagnosis not present

## 2019-09-14 DIAGNOSIS — M2550 Pain in unspecified joint: Secondary | ICD-10-CM

## 2019-09-14 DIAGNOSIS — M4810 Ankylosing hyperostosis [Forestier], site unspecified: Secondary | ICD-10-CM

## 2019-09-14 DIAGNOSIS — R42 Dizziness and giddiness: Secondary | ICD-10-CM | POA: Diagnosis not present

## 2019-09-14 DIAGNOSIS — I10 Essential (primary) hypertension: Secondary | ICD-10-CM | POA: Diagnosis not present

## 2019-09-14 MED ORDER — METOPROLOL TARTRATE 100 MG PO TABS: 100.0000 mg | ORAL_TABLET | Freq: Two times a day (BID) | ORAL | 1 refills | Status: DC

## 2019-09-14 MED ORDER — TRAMADOL HCL 50 MG PO TABS: ORAL_TABLET | ORAL | 1 refills | Status: DC

## 2019-09-14 MED ORDER — ALLOPURINOL 100 MG PO TABS: 200.0000 mg | ORAL_TABLET | Freq: Every day | ORAL | 1 refills | Status: DC

## 2019-09-14 MED ORDER — PREDNISONE 20 MG PO TABS: ORAL_TABLET | ORAL | 2 refills | Status: DC

## 2019-09-14 MED ORDER — AMLODIPINE BESYLATE 10 MG PO TABS: ORAL_TABLET | ORAL | 1 refills | Status: DC

## 2019-09-14 NOTE — Patient Instructions (Addendum)
  Increase allopurinol to 200mg  daily

## 2019-09-14 NOTE — Progress Notes (Signed)
Luis Fisher DOB: December 02, 1955 Encounter date: 09/14/2019  This is a 64 y.o. male who presents with Chief Complaint  Patient presents with  . Follow-up    History of present illness: Last visit was in February/2021.  Hypertension: getting 130-135/88-93 and usually heart rate about in low to mid 80's when checking at home. This is higher than his normal. Checked against another cuff and got similar readings. That is just some of time, not all the time. Was on higher dose of metoprolol in the past, but cut back due to feeling dizzy/woozy/tired.   DISH: referred to rheum at last visit. At first thought that uric acid medication was prolonging "not having flares", but then turned out not to be the case. Can flare as soon as prednisone stops. Last 2 days has been on crutches. Now on prednisone and able to hobble around. Started out on 40mg  2 days ago and down to 20mg  today. Pain is at achilles tendons where they tie into heels. When this flared up then he does get some swelling in feet. Taking 100mg  metoprolol in morning and then in afternoon takes 50mg  tablet, then at bed other 50mg . Takes the amlodipine half dose in morning and other half in afternoon.   Has had a little more cough than normal, but attributes to pollen. Throat and upper chest get tight with this, but resolves when he goes inside.   Has had 3 positional vertigo episodes since here last. These episodes occur when putting head down on left side; can't lift head, bed spins start. Most of time spinning, but has had a couple of episodes of vertical episodes. Hasn't laid on left side of head x 2 months. Nausea is bad. Once sitting up then goes away. Feels like he will have diarrhea too. Bought wedge pillow and keeps head elevated. Has chronic tinnitus. Hears with pulse now. Not suddenly worse, but has worsened in last 40 years.   HL: diet controlled.    Allergies  Allergen Reactions  . Atorvastatin Other (See Comments)    Muscle aches,  memory loss  . Contrast Media [Iodinated Diagnostic Agents] Other (See Comments)    Oral IV Contrast, Convulsions   Current Meds  Medication Sig  . allopurinol (ZYLOPRIM) 100 MG tablet Take 2 tablets (200 mg total) by mouth daily.  [name] amLODipine (NORVASC) 10 MG tablet TAKE 1 TABLET EACH DAY.  [name] aspirin 325 MG tablet Take 325 mg by mouth daily. Reports taking aspirin 2-3 times per week  . clobetasol cream (TEMOVATE) 0.05 % Apply topically 2 (two) times daily as needed.  . fluticasone (FLONASE) 50 MCG/ACT nasal spray USE 1-2 SPRAYS IN EACH NOSTRIL DAILY AS NEEDED.  [name] HYDROcodone-acetaminophen (NORCO) 5-325 MG tablet Take 1 tablet by mouth 2 (two) times daily as needed for moderate pain or severe pain.  [name] lidocaine (LIDODERM) 5 % APPLY 1 PATCH TO THE SKIN DAILY. REMOVE & DISCARD PATCH WITHIN 12 HOURS OR AS DIRECTED BY MD.  . meloxicam (MOBIC) 7.5 MG tablet Take 1 tablet (7.5 mg total) by mouth daily.  . metoprolol tartrate (LOPRESSOR) 100 MG tablet TAKE ONE TABLET BY MOUTH TWICE DAILY   . predniSONE (DELTASONE) 20 MG tablet 1 tablet twice daily as needed for arthritis flare  . traMADol (ULTRAM) 50 MG tablet TAKE 1 TABLET EVERY SIX HOURS AS NEEDED FOR PAIN.  . [DISCONTINUED] allopurinol (ZYLOPRIM) 100 MG tablet Take 1 tablet (100 mg total) by mouth daily.  . [DISCONTINUED] amoxicillin-clavulanate (AUGMENTIN) 875-125 MG tablet Take 1  tablet by mouth 2 (two) times daily.    Review of Systems  Constitutional: Negative for chills, fatigue and fever.  Respiratory: Negative for cough, chest tightness, shortness of breath and wheezing.   Cardiovascular: Negative for chest pain, palpitations and leg swelling.    Objective:  BP 128/82 (BP Location: Left Arm, Patient Position: Sitting, Cuff Size: Large)   Pulse 88   Temp 97.9 F (36.6 C) (Temporal)   Ht 5\' 9"  (1.753 m)   Wt 227 lb 4.8 oz (103.1 kg)   BMI 33.57 kg/m   Weight: 227 lb 4.8 oz (103.1 kg)   BP Readings from Last 3 Encounters:   09/14/19 128/82  06/11/19 124/64  10/24/18 135/78   Wt Readings from Last 3 Encounters:  09/14/19 227 lb 4.8 oz (103.1 kg)  06/11/19 228 lb 12.8 oz (103.8 kg)  04/05/18 215 lb 8 oz (97.8 kg)    Physical Exam Constitutional:      General: He is not in acute distress.    Appearance: He is well-developed.  HENT:     Right Ear: Tympanic membrane, ear canal and external ear normal.     Left Ear: Tympanic membrane, ear canal and external ear normal.  Cardiovascular:     Rate and Rhythm: Normal rate and regular rhythm.     Pulses:          Radial pulses are 2+ on the right side and 2+ on the left side.       Posterior tibial pulses are 2+ on the right side and 2+ on the left side.     Heart sounds: Normal heart sounds. No murmur. No friction rub.  Pulmonary:     Effort: Pulmonary effort is normal. No respiratory distress.     Breath sounds: Normal breath sounds. No wheezing or rales.  Musculoskeletal:     Right lower leg: No edema.     Left lower leg: No edema.     Comments: Range of motion of spine is extremely limited.  He only has a few degree range of motion laterally with his neck, and really no significant extension or flexion ability.  Difficult for him to walk.  Neurological:     Mental Status: He is alert and oriented to person, place, and time.  Psychiatric:        Behavior: Behavior normal.     Assessment/Plan  1. Essential hypertension Blood pressure is a little higher than desired, but I worry about lowering his blood pressure too much with the dizzy episodes that he is having.  I encouraged him to check his blood pressures at home and report back to me in a couple weeks.  He has been on higher doses of metoprolol in the past, but had issues with fatigue taking this medication.  He does not wish to take any more medications, but we will need to talk through options again pending his blood pressure report update. - amLODipine (NORVASC) 10 MG tablet; TAKE 1 TABLET EACH  DAY.  Dispense: 90 tablet; Refill: 1 - metoprolol tartrate (LOPRESSOR) 100 MG tablet; Take 1 tablet (100 mg total) by mouth 2 (two) times daily.  Dispense: 180 tablet; Refill: 1 - traMADol (ULTRAM) 50 MG tablet; TAKE 1 TABLET EVERY SIX HOURS AS NEEDED FOR PAIN.  Dispense: 90 tablet; Refill: 1  2. DISH (diffuse idiopathic skeletal hyperostosis) He does get relief with prednisone when he takes it acutely in a flare.  We reviewed today that he does not always need to  take a long taper of prednisone when flares occur and he might be able to try just a short burst of prednisone.  We have discussed him following up with a specialist, but he is adamant about not wanting to take any biologic types of treatments, so does not wish to see the specialist.  He does have increasing discomfort and pain with flares.  He rarely takes pain medication, but does use prednisone when flares occur.  His flares are severe, and his musculoskeletal condition is worsening. - predniSONE (DELTASONE) 20 MG tablet; 1 tablet twice daily as needed for arthritis flare  Dispense: 30 tablet; Refill: 2  3. Elevated uric acid in blood He had 1 month without flareups after allopurinol was started.  We are going to try to increase to 200 mg daily and see if this further helps at all. - allopurinol (ZYLOPRIM) 100 MG tablet; Take 2 tablets (200 mg total) by mouth daily.  Dispense: 180 tablet; Refill: 1 - Uric acid; Future  4. Dizziness Could be multifactorial.  He is extremely limited with ability to move his neck.  He does bring up concern of history getting scans for his carotid arteries and whether this may be playing a role.  I think it is reasonable to recheck this.  He does better with staying upright.  Consider further evaluation pending these results. - US Carotid Bilateral; Future  5. Arthralgia, unspecified joint Okay for refills on tramadol to help with pain.  Rarely will take hydrocodone having very severe pain. - traMADol  (ULTRAM) 50 MG tablet; TAKE 1 TABLET EVERY SIX HOURS AS NEEDED FOR PAIN.  Dispense: 90 tablet; Refill: 1    Return in about 3 months (around 12/15/2019) for Chronic condition visit.     Micheline Rough, MD

## 2019-09-16 ENCOUNTER — Encounter: Payer: Self-pay | Admitting: Family Medicine

## 2019-10-03 DIAGNOSIS — C61 Malignant neoplasm of prostate: Secondary | ICD-10-CM | POA: Diagnosis not present

## 2019-10-03 DIAGNOSIS — E230 Hypopituitarism: Secondary | ICD-10-CM | POA: Diagnosis not present

## 2019-11-07 DIAGNOSIS — B078 Other viral warts: Secondary | ICD-10-CM | POA: Diagnosis not present

## 2019-12-19 ENCOUNTER — Ambulatory Visit: Payer: Medicare Other | Admitting: Family Medicine

## 2020-03-03 ENCOUNTER — Ambulatory Visit (HOSPITAL_COMMUNITY)
Admission: RE | Admit: 2020-03-03 | Discharge: 2020-03-03 | Disposition: A | Discharge status: Home / Self Care | Payer: Medicare Other | Source: Ambulatory Visit | Attending: Internal Medicine | Admitting: Internal Medicine

## 2020-03-03 DIAGNOSIS — R42 Dizziness and giddiness: Secondary | ICD-10-CM | POA: Diagnosis not present

## 2020-03-05 ENCOUNTER — Encounter: Payer: Self-pay | Admitting: Family Medicine

## 2020-03-10 DIAGNOSIS — H5203 Hypermetropia, bilateral: Secondary | ICD-10-CM | POA: Diagnosis not present

## 2020-03-10 DIAGNOSIS — C61 Malignant neoplasm of prostate: Secondary | ICD-10-CM | POA: Diagnosis not present

## 2020-03-10 DIAGNOSIS — H353131 Nonexudative age-related macular degeneration, bilateral, early dry stage: Secondary | ICD-10-CM | POA: Diagnosis not present

## 2020-03-10 DIAGNOSIS — H52203 Unspecified astigmatism, bilateral: Secondary | ICD-10-CM | POA: Diagnosis not present

## 2020-03-12 ENCOUNTER — Other Ambulatory Visit: Payer: Self-pay

## 2020-03-12 ENCOUNTER — Encounter: Payer: Self-pay | Admitting: Family Medicine

## 2020-03-12 ENCOUNTER — Ambulatory Visit (INDEPENDENT_AMBULATORY_CARE_PROVIDER_SITE_OTHER): Payer: Medicare Other | Admitting: Family Medicine

## 2020-03-12 VITALS — BP 130/68 | HR 81 | Temp 98.3°F | Ht 69.0 in | Wt 230.4 lb

## 2020-03-12 DIAGNOSIS — E781 Pure hyperglyceridemia: Secondary | ICD-10-CM

## 2020-03-12 DIAGNOSIS — I10 Essential (primary) hypertension: Secondary | ICD-10-CM

## 2020-03-12 DIAGNOSIS — E790 Hyperuricemia without signs of inflammatory arthritis and tophaceous disease: Secondary | ICD-10-CM | POA: Diagnosis not present

## 2020-03-12 DIAGNOSIS — M4810 Ankylosing hyperostosis [Forestier], site unspecified: Secondary | ICD-10-CM

## 2020-03-12 DIAGNOSIS — M2550 Pain in unspecified joint: Secondary | ICD-10-CM

## 2020-03-12 MED ORDER — METOPROLOL TARTRATE 100 MG PO TABS: 100.0000 mg | ORAL_TABLET | Freq: Two times a day (BID) | ORAL | 1 refills | Status: DC

## 2020-03-12 NOTE — Progress Notes (Signed)
Luis Fisher DOB: Feb 01, 1956 Encounter date: 03/12/2020  This is a 64 y.o. male who presents with Chief Complaint  Patient presents with  . Follow-up    patient states he has changed dosing of Amlodipine to twice a day and Metoprolol to three times a day due to dizziness    History of present illness:  HTN: amlodipine 5mg  BID,Lopressor 100mg  BID; has had mild improvement with change in taking medications. Has done better with swelling taking the half dose of amlodipine as well. Has had some episodes of positional vertigo since being here. Tends to happen when lying in bed, esp on left side. The nausea is almost instantaneous. Blood pressure has been pretty stable regardless of what he does. Hard for him to keep linear focus/concentration with the beta blocker. Has noted this since starting it. Seems forgetful in conversation.   DISH/arthralgia - tramadol, hydrocodone. Has been doing pretty well from this standpoint. Taking about 2 tramadol/week. Doesn't want to take more and add to dizziness. Hasn't used any hydrocodone. Does get flares which are really painful, but has done well with these. Uses prednisone at those times.   We increased allopurinol to 200mg  at last visit. Hoping to improved uric acid levels in hopes that this will help with pain somewhat. Not sure if helping; has been a few weeks since flares. They come back to back sometimes when he gets them - in both feet. Happens more in feet, hands.   Had renal stone; in pain for about 11 hours. Vomiting into sink. Last think he remembered was trying to get in bed and then passed out in pain.   Allergies  Allergen Reactions  . Atorvastatin Other (See Comments)    Muscle aches, memory loss  . Contrast Media [Iodinated Diagnostic Agents] Other (See Comments)    Oral IV Contrast, Convulsions   Current Meds  Medication Sig  . allopurinol (ZYLOPRIM) 100 MG tablet Take 2 tablets (200 mg total) by mouth daily.  [name] amLODipine (NORVASC) 10  MG tablet TAKE 1 TABLET EACH DAY. (Patient taking differently: Take 5 mg by mouth 2 (two) times daily. TAKE 1 TABLET EACH DAY.)  . aspirin 325 MG tablet Take 325 mg by mouth daily. Reports taking aspirin 2-3 times per week  . clobetasol cream (TEMOVATE) 0.05 % Apply topically 2 (two) times daily as needed.  . fluticasone (FLONASE) 50 MCG/ACT nasal spray USE 1-2 SPRAYS IN EACH NOSTRIL DAILY AS NEEDED.  [name] HYDROcodone-acetaminophen (NORCO) 5-325 MG tablet Take 1 tablet by mouth 2 (two) times daily as needed for moderate pain or severe pain.  [name] lidocaine (LIDODERM) 5 % APPLY 1 PATCH TO THE SKIN DAILY. REMOVE & DISCARD PATCH WITHIN 12 HOURS OR AS DIRECTED BY MD.  . meloxicam (MOBIC) 7.5 MG tablet Take 1 tablet (7.5 mg total) by mouth daily.  . metoprolol tartrate (LOPRESSOR) 100 MG tablet Take 1 tablet (100 mg total) by mouth 2 (two) times daily. (Patient taking differently: Take 100 mg by mouth in the morning, at noon, and at bedtime. )  . predniSONE (DELTASONE) 20 MG tablet 1 tablet twice daily as needed for arthritis flare  . traMADol (ULTRAM) 50 MG tablet TAKE 1 TABLET EVERY SIX HOURS AS NEEDED FOR PAIN.    Review of Systems  Constitutional: Negative for chills, fatigue and fever.  Respiratory: Negative for cough, chest tightness, shortness of breath and wheezing.   Cardiovascular: Negative for chest pain, palpitations and leg swelling.  Musculoskeletal: Positive for back pain, neck pain and  neck stiffness.  Neurological: Positive for dizziness.    Objective:  BP 130/68 (BP Location: Left Arm, Patient Position: Sitting, Cuff Size: Large)   Pulse 81   Temp 98.3 F (36.8 C) (Oral)   Ht 5\' 9"  (1.753 m)   Wt 230 lb 6.4 oz (104.5 kg)   BMI 34.02 kg/m   Weight: 230 lb 6.4 oz (104.5 kg)   BP Readings from Last 3 Encounters:  03/12/20 130/68  09/14/19 128/82  06/11/19 124/64   Wt Readings from Last 3 Encounters:  03/12/20 230 lb 6.4 oz (104.5 kg)  09/14/19 227 lb 4.8 oz (103.1 kg)   06/11/19 228 lb 12.8 oz (103.8 kg)    Physical Exam Constitutional:      General: He is not in acute distress.    Appearance: He is well-developed.  Cardiovascular:     Rate and Rhythm: Normal rate and regular rhythm.     Heart sounds: Normal heart sounds. No murmur heard.  No friction rub.  Pulmonary:     Effort: Pulmonary effort is normal. No respiratory distress.     Breath sounds: Normal breath sounds. No wheezing or rales.  Musculoskeletal:     Right lower leg: No edema.     Left lower leg: No edema.     Comments: Patient unable to twist head, extend/flex neck. Sits very upright due to DISH back.   Neurological:     Mental Status: He is alert and oriented to person, place, and time.  Psychiatric:        Behavior: Behavior normal.     Assessment/Plan  1. Essential hypertension Well controlled. Continue current medication; we will look to see if succinate is covered.  - metoprolol tartrate (LOPRESSOR) 100 MG tablet; Take 1 tablet (100 mg total) by mouth 2 (two) times daily.  Dispense: 180 tablet; Refill: 1 - CBC with Differential/Platelet; Future - Comprehensive metabolic panel; Future  2. DISH (diffuse idiopathic skeletal hyperostosis) He is doing well overall with pain control.  [place] for refills of tramadol if needed; hydrocodone for severe pain if needed.  3. Hypertriglyceridemia - Lipid panel; Future  4. Arthralgia, unspecified joint This is stable. He does very well with intermittent prednisone for flare ups. He does not want to be on biologics. He uses the prednisone typically for 3 days with flare, which bring him from completely incapacitate (using walker) to functioning. Advised using otc acid blocking med when taking prednisone for stomach protection.  5. Elevated uric acid in blood Continue with allopurinol 200mg  daily. - Uric acid; Future    Return in about 6 months (around 09/09/2020) for Chronic condition visit/physical.     Micheline Rough, MD

## 2020-03-12 NOTE — Patient Instructions (Signed)
See what cost of metoprolol succinate is for you. We could consider switching to a 100mg  daily dose (which is a little lower than your current dosing).

## 2020-03-13 LAB — LIPID PANEL
Cholesterol: 212 mg/dL — ABNORMAL HIGH (ref <200)
HDL: 47 mg/dL (ref >=40)
LDL Cholesterol (Calc): 110 mg/dL (calc) — ABNORMAL HIGH
Non-HDL Cholesterol (Calc): 165 mg/dL (calc) — ABNORMAL HIGH (ref <130)
Total CHOL/HDL Ratio: 4.5 (calc) (ref <5.0)
Triglycerides: 383 mg/dL — ABNORMAL HIGH (ref <150)

## 2020-03-13 LAB — COMPREHENSIVE METABOLIC PANEL
AG Ratio: 1.8 (calc) (ref 1.0–2.5)
ALT: 42 U/L (ref 9–46)
AST: 31 U/L (ref 10–35)
Albumin: 4.3 g/dL (ref 3.6–5.1)
Alkaline phosphatase (APISO): 20 U/L — ABNORMAL LOW (ref 35–144)
BUN: 13 mg/dL (ref 7–25)
CO2: 29 mmol/L (ref 20–32)
Calcium: 10.1 mg/dL (ref 8.6–10.3)
Chloride: 101 mmol/L (ref 98–110)
Creat: 0.92 mg/dL (ref 0.70–1.25)
Globulin: 2.4 g/dL (calc) (ref 1.9–3.7)
Glucose, Bld: 119 mg/dL — ABNORMAL HIGH (ref 65–99)
Potassium: 4.3 mmol/L (ref 3.5–5.3)
Sodium: 141 mmol/L (ref 135–146)
Total Bilirubin: 0.6 mg/dL (ref 0.2–1.2)
Total Protein: 6.7 g/dL (ref 6.1–8.1)

## 2020-03-13 LAB — CBC WITH DIFFERENTIAL/PLATELET
Absolute Monocytes: 528 cells/uL (ref 200–950)
Basophils Absolute: 40 cells/uL (ref 0–200)
Basophils Relative: 0.6 %
Eosinophils Absolute: 99 cells/uL (ref 15–500)
Eosinophils Relative: 1.5 %
HCT: 45.3 % (ref 38.5–50.0)
Hemoglobin: 15.2 g/dL (ref 13.2–17.1)
Lymphs Abs: 1439 cells/uL (ref 850–3900)
MCH: 31.1 pg (ref 27.0–33.0)
MCHC: 33.6 g/dL (ref 32.0–36.0)
MCV: 92.6 fL (ref 80.0–100.0)
MPV: 9.7 fL (ref 7.5–12.5)
Monocytes Relative: 8 %
Neutro Abs: 4495 cells/uL (ref 1500–7800)
Neutrophils Relative %: 68.1 %
Platelets: 287 10*3/uL (ref 140–400)
RBC: 4.89 10*6/uL (ref 4.20–5.80)
RDW: 12.1 % (ref 11.0–15.0)
Total Lymphocyte: 21.8 %
WBC: 6.6 10*3/uL (ref 3.8–10.8)

## 2020-03-13 LAB — URIC ACID: Uric Acid, Serum: 5.6 mg/dL (ref 4.0–8.0)

## 2020-03-17 DIAGNOSIS — E230 Hypopituitarism: Secondary | ICD-10-CM | POA: Diagnosis not present

## 2020-03-17 DIAGNOSIS — R3915 Urgency of urination: Secondary | ICD-10-CM | POA: Diagnosis not present

## 2020-03-17 DIAGNOSIS — C61 Malignant neoplasm of prostate: Secondary | ICD-10-CM | POA: Diagnosis not present

## 2020-03-18 ENCOUNTER — Telehealth: Payer: Self-pay | Admitting: Family Medicine

## 2020-03-18 NOTE — Telephone Encounter (Signed)
Left message for patient to call back and schedule Medicare Annual Wellness Visit (AWV) either virtually or in office.   Last AWV 10/04/16 ; please schedule at anytime with [facility] Nurse Health Advisor 1 or 2   This should be a 45 minute visit.

## 2020-04-01 ENCOUNTER — Other Ambulatory Visit: Payer: Self-pay | Admitting: Family Medicine

## 2020-04-01 DIAGNOSIS — I10 Essential (primary) hypertension: Secondary | ICD-10-CM

## 2020-04-29 IMAGING — DX CHEST - 2 VIEW
2 series · 2 of 2 positions shown · non-contrast
Comparison: Chest radiograph 11/06/2012, CT abdomen pelvis
10/12/2014

CLINICAL DATA: Shortness of breath for 3 months, patient concerned
for pneumonia

EXAM:
CHEST - 2 VIEW

[chest pa]
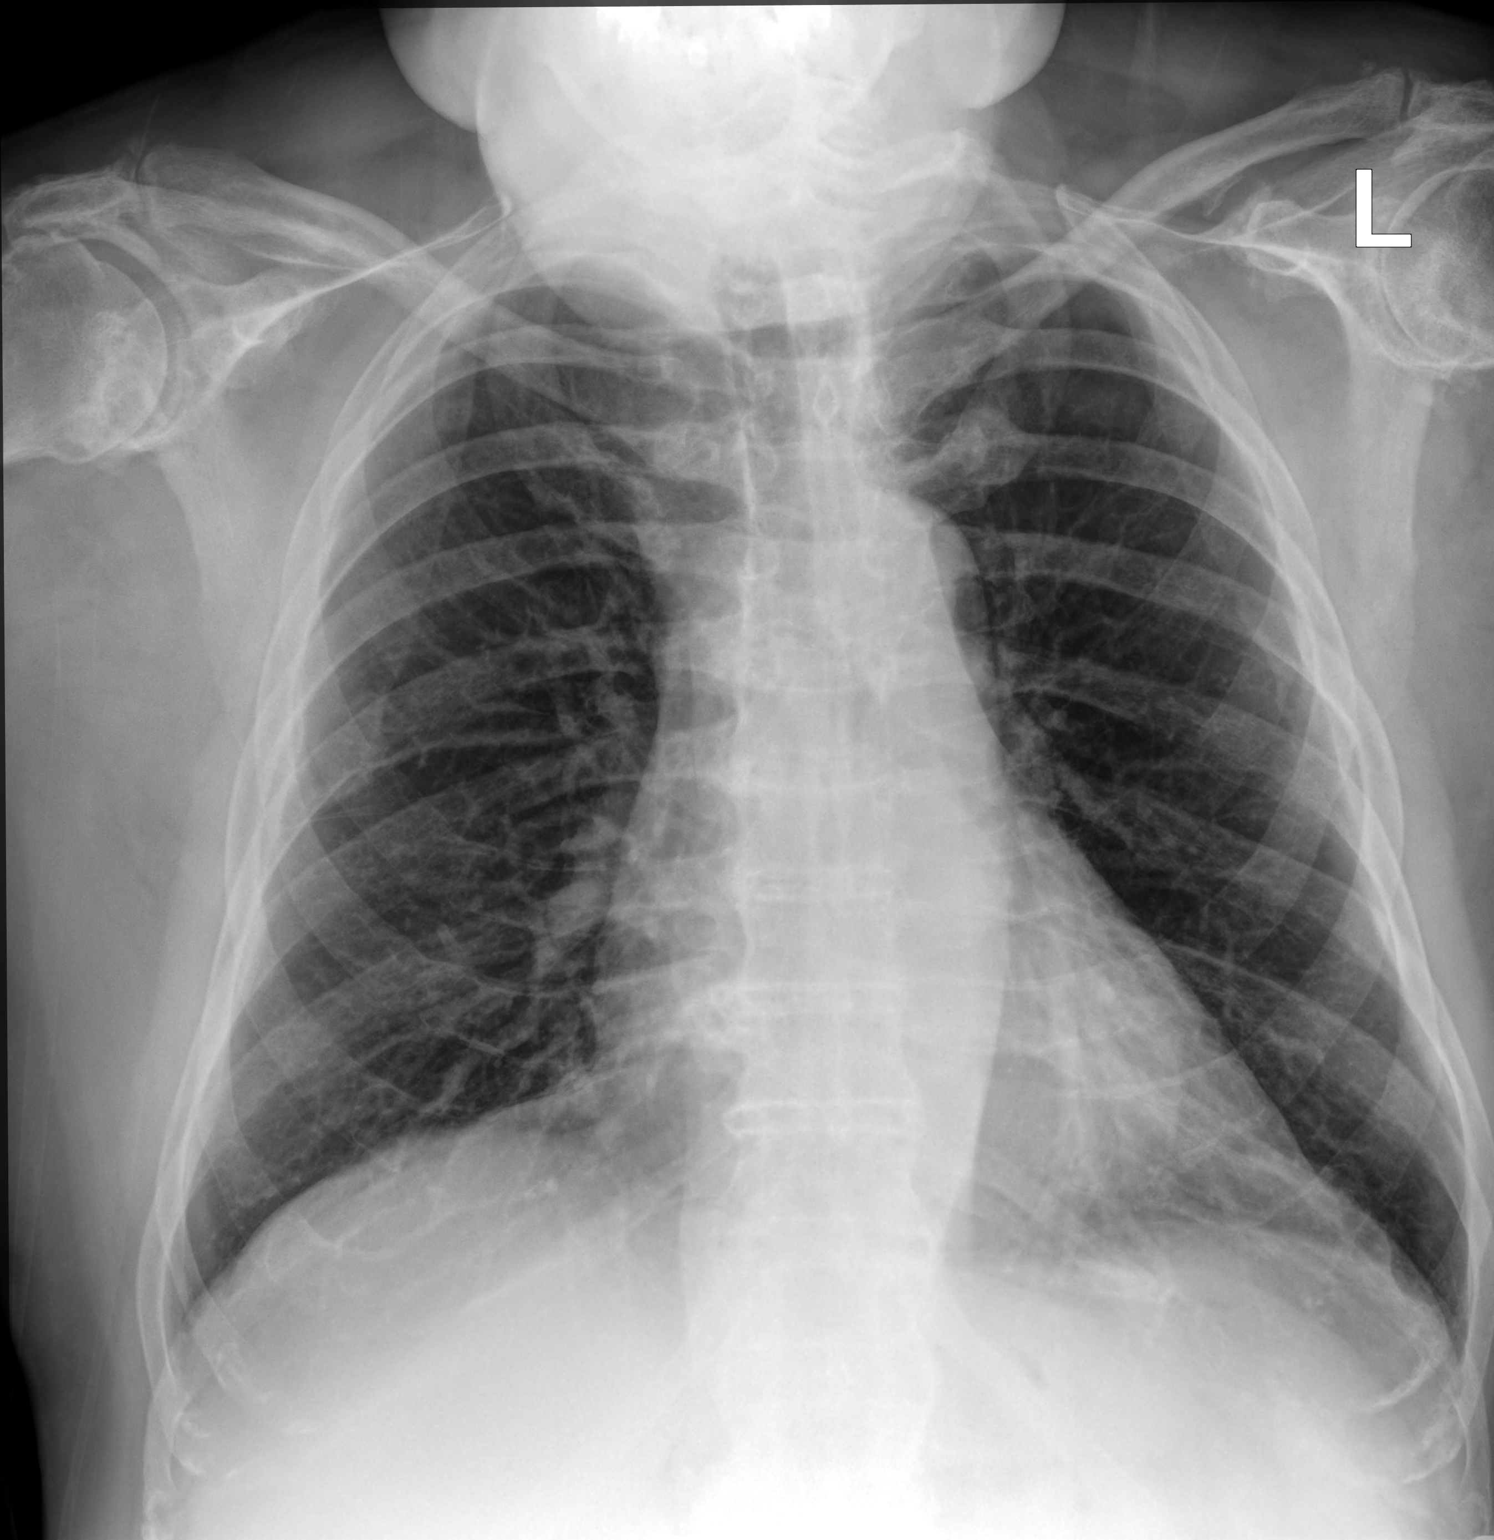

[chest lat]
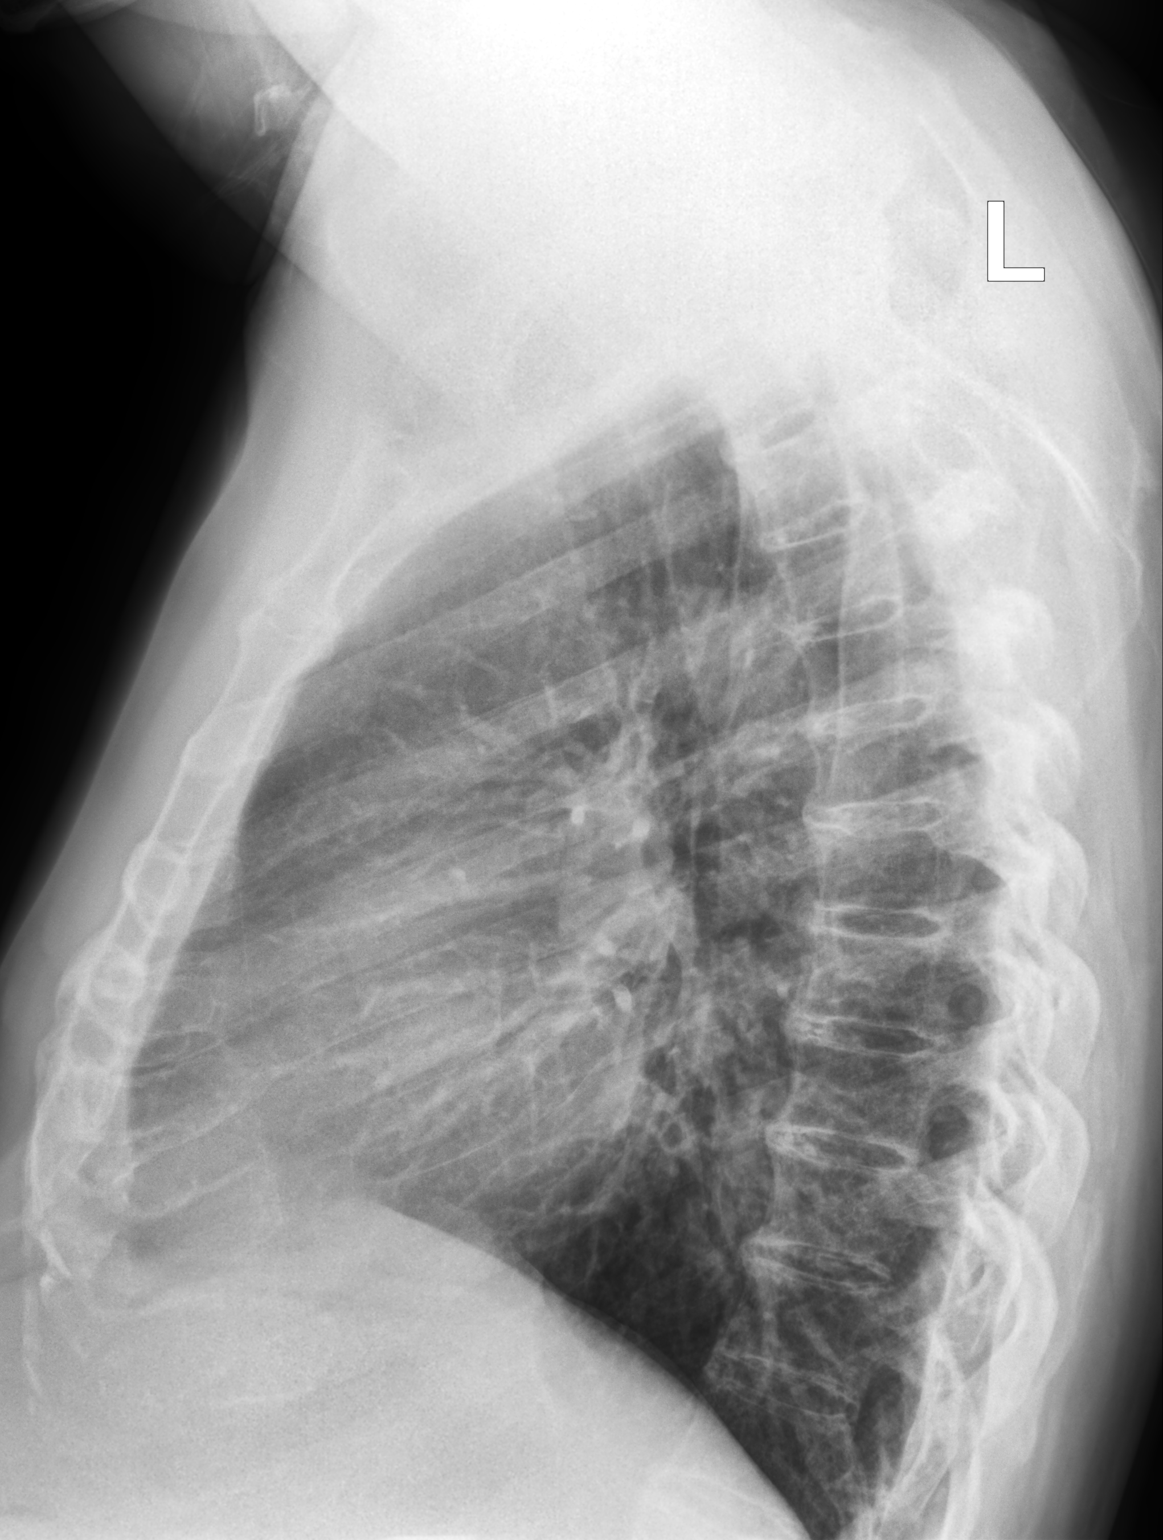

[2 of 2 positions shown; findings below may reference images not displayed]

FINDINGS: The heart size and mediastinal contours are within normal limits.
Both lungs are clear. Severe degenerative changes in both shoulders
are similar to prior studies. Mild discogenic changes in the imaged
spine.
IMPRESSION: No active cardiopulmonary disease.

## 2020-07-16 ENCOUNTER — Other Ambulatory Visit: Payer: Self-pay | Admitting: Family Medicine

## 2020-07-16 DIAGNOSIS — E790 Hyperuricemia without signs of inflammatory arthritis and tophaceous disease: Secondary | ICD-10-CM

## 2020-09-02 DIAGNOSIS — C61 Malignant neoplasm of prostate: Secondary | ICD-10-CM | POA: Diagnosis not present

## 2020-09-02 DIAGNOSIS — E230 Hypopituitarism: Secondary | ICD-10-CM | POA: Diagnosis not present

## 2020-09-10 DIAGNOSIS — C61 Malignant neoplasm of prostate: Secondary | ICD-10-CM | POA: Diagnosis not present

## 2020-09-10 DIAGNOSIS — R3915 Urgency of urination: Secondary | ICD-10-CM | POA: Diagnosis not present

## 2020-09-10 DIAGNOSIS — E230 Hypopituitarism: Secondary | ICD-10-CM | POA: Diagnosis not present

## 2020-09-11 ENCOUNTER — Other Ambulatory Visit: Payer: Self-pay

## 2020-09-12 ENCOUNTER — Ambulatory Visit (INDEPENDENT_AMBULATORY_CARE_PROVIDER_SITE_OTHER): Payer: Medicare Other | Admitting: Family Medicine

## 2020-09-12 ENCOUNTER — Encounter: Payer: Self-pay | Admitting: Family Medicine

## 2020-09-12 VITALS — BP 140/78 | HR 75 | Temp 97.8°F | Ht 68.0 in | Wt 233.5 lb

## 2020-09-12 DIAGNOSIS — R739 Hyperglycemia, unspecified: Secondary | ICD-10-CM | POA: Diagnosis not present

## 2020-09-12 DIAGNOSIS — L309 Dermatitis, unspecified: Secondary | ICD-10-CM | POA: Diagnosis not present

## 2020-09-12 DIAGNOSIS — R350 Frequency of micturition: Secondary | ICD-10-CM | POA: Diagnosis not present

## 2020-09-12 DIAGNOSIS — E790 Hyperuricemia without signs of inflammatory arthritis and tophaceous disease: Secondary | ICD-10-CM

## 2020-09-12 DIAGNOSIS — Z8719 Personal history of other diseases of the digestive system: Secondary | ICD-10-CM | POA: Diagnosis not present

## 2020-09-12 DIAGNOSIS — M4810 Ankylosing hyperostosis [Forestier], site unspecified: Secondary | ICD-10-CM

## 2020-09-12 DIAGNOSIS — I10 Essential (primary) hypertension: Secondary | ICD-10-CM | POA: Diagnosis not present

## 2020-09-12 DIAGNOSIS — E781 Pure hyperglyceridemia: Secondary | ICD-10-CM

## 2020-09-12 DIAGNOSIS — Z131 Encounter for screening for diabetes mellitus: Secondary | ICD-10-CM | POA: Diagnosis not present

## 2020-09-12 DIAGNOSIS — R682 Dry mouth, unspecified: Secondary | ICD-10-CM | POA: Diagnosis not present

## 2020-09-12 DIAGNOSIS — Z1211 Encounter for screening for malignant neoplasm of colon: Secondary | ICD-10-CM

## 2020-09-12 LAB — COMPREHENSIVE METABOLIC PANEL
ALT: 26 U/L (ref 0–53)
AST: 17 U/L (ref 0–37)
Albumin: 4.3 g/dL (ref 3.5–5.2)
Alkaline Phosphatase: 16 U/L — ABNORMAL LOW (ref 39–117)
BUN: 22 mg/dL (ref 6–23)
CO2: 23 mEq/L (ref 19–32)
Calcium: 9.2 mg/dL (ref 8.4–10.5)
Chloride: 105 mEq/L (ref 96–112)
Creatinine, Ser: 0.95 mg/dL (ref 0.40–1.50)
GFR: 82.97 mL/min (ref >60.00)
Glucose, Bld: 109 mg/dL — ABNORMAL HIGH (ref 70–99)
Potassium: 3.9 mEq/L (ref 3.5–5.1)
Sodium: 140 mEq/L (ref 135–145)
Total Bilirubin: 0.4 mg/dL (ref 0.2–1.2)
Total Protein: 6.4 g/dL (ref 6.0–8.3)

## 2020-09-12 LAB — LIPID PANEL
Cholesterol: 191 mg/dL (ref 0–200)
HDL: 59.2 mg/dL (ref >39.00)
LDL Cholesterol: 105 mg/dL — ABNORMAL HIGH (ref 0–99)
NonHDL: 131.56
Total CHOL/HDL Ratio: 3
Triglycerides: 134 mg/dL (ref 0.0–149.0)
VLDL: 26.8 mg/dL (ref 0.0–40.0)

## 2020-09-12 LAB — CBC WITH DIFFERENTIAL/PLATELET
Basophils Absolute: 0 10*3/uL (ref 0.0–0.1)
Basophils Relative: 0.4 % (ref 0.0–3.0)
Eosinophils Absolute: 0 10*3/uL (ref 0.0–0.7)
Eosinophils Relative: 0.2 % (ref 0.0–5.0)
HCT: 41 % (ref 39.0–52.0)
Hemoglobin: 14.1 g/dL (ref 13.0–17.0)
Lymphocytes Relative: 23.6 % (ref 12.0–46.0)
Lymphs Abs: 2.2 10*3/uL (ref 0.7–4.0)
MCHC: 34.5 g/dL (ref 30.0–36.0)
MCV: 92.3 fl (ref 78.0–100.0)
Monocytes Absolute: 0.8 10*3/uL (ref 0.1–1.0)
Monocytes Relative: 8.8 % (ref 3.0–12.0)
Neutro Abs: 6.3 10*3/uL (ref 1.4–7.7)
Neutrophils Relative %: 67 % (ref 43.0–77.0)
Platelets: 254 10*3/uL (ref 150.0–400.0)
RBC: 4.44 Mil/uL (ref 4.22–5.81)
RDW: 13.3 % (ref 11.5–15.5)
WBC: 9.4 10*3/uL (ref 4.0–10.5)

## 2020-09-12 LAB — TSH: TSH: 2.93 u[IU]/mL (ref 0.35–4.50)

## 2020-09-12 LAB — URIC ACID: Uric Acid, Serum: 5.9 mg/dL (ref 4.0–7.8)

## 2020-09-12 LAB — HEMOGLOBIN A1C: Hgb A1c MFr Bld: 5.9 % (ref 4.6–6.5)
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
## 2020-09-12 NOTE — Patient Instructions (Signed)
Use clobetasol on lower leg twice daily x 2 weeks; let me know if not resolved after this time.   Ok to use hydrocodone for pain as needed. Let me know how this works for you.

## 2020-09-16 LAB — SJOGRENS SYNDROME-A EXTRACTABLE NUCLEAR ANTIBODY: SSA (Ro) (ENA) Antibody, IgG: <1 AI

## 2020-10-01 ENCOUNTER — Other Ambulatory Visit: Payer: Self-pay | Admitting: Family Medicine

## 2020-10-01 ENCOUNTER — Telehealth: Payer: Self-pay | Admitting: Family Medicine

## 2020-10-01 DIAGNOSIS — E790 Hyperuricemia without signs of inflammatory arthritis and tophaceous disease: Secondary | ICD-10-CM

## 2020-10-01 DIAGNOSIS — M4810 Ankylosing hyperostosis [Forestier], site unspecified: Secondary | ICD-10-CM

## 2020-10-01 DIAGNOSIS — I10 Essential (primary) hypertension: Secondary | ICD-10-CM

## 2020-10-01 DIAGNOSIS — M2550 Pain in unspecified joint: Secondary | ICD-10-CM

## 2020-10-01 NOTE — Telephone Encounter (Signed)
If still only getting minimal relief from 50mg  tramadol; would be ok to try 100mg  to see if this gives more benefit. I gave a few extra in supply if he would like to try this.

## 2020-10-01 NOTE — Telephone Encounter (Signed)
See prior Rx request.

## 2020-10-01 NOTE — Telephone Encounter (Signed)
Pt call and stated he need a refill on  allopurinol (ZYLOPRIM) 100 MG tablet , amLODipine (NORVASC) 10 MG tablet , predniSONE (DELTASONE) 20 MG tablet,metoprolol tartrate (LOPRESSOR) 100 MG tablet and  traMADol (ULTRAM) 50 MG tablet sent to  [facility] # [address], [place] Phone:  [phone]  Fax:  [phone]

## 2020-10-02 ENCOUNTER — Other Ambulatory Visit: Payer: Self-pay | Admitting: Family Medicine

## 2020-10-02 ENCOUNTER — Telehealth: Payer: Self-pay | Admitting: Family Medicine

## 2020-10-02 DIAGNOSIS — I10 Essential (primary) hypertension: Secondary | ICD-10-CM

## 2020-10-02 DIAGNOSIS — M2550 Pain in unspecified joint: Secondary | ICD-10-CM

## 2020-10-02 MED ORDER — TRAMADOL HCL 50 MG PO TABS: 50.0000 mg | ORAL_TABLET | Freq: Four times a day (QID) | ORAL | 0 refills | Status: DC | PRN

## 2020-10-02 NOTE — Telephone Encounter (Signed)
Spoke with Elmyra Ricks at [facility] and informed her of the message below.

## 2020-10-02 NOTE — Telephone Encounter (Signed)
Katie from [facility] called and stated that the patient's prescription for tramadol came in with two sets of instructions and they need clarification for which instructions are correct.  Number is [phone].  Please advise.

## 2020-10-02 NOTE — Telephone Encounter (Signed)
I resent rx with correct sig. Should be 1-2 tabs tramadol 50mg  q 6 hours prn.

## 2020-10-06 DIAGNOSIS — Z1211 Encounter for screening for malignant neoplasm of colon: Secondary | ICD-10-CM | POA: Diagnosis not present

## 2020-10-06 LAB — COLOGUARD: Cologuard: NEGATIVE

## 2020-10-11 LAB — COLOGUARD: COLOGUARD: NEGATIVE

## 2020-10-22 ENCOUNTER — Encounter: Payer: Self-pay | Admitting: Family Medicine

## 2020-10-22 NOTE — Progress Notes (Signed)
abs

## 2020-12-25 DIAGNOSIS — L738 Other specified follicular disorders: Secondary | ICD-10-CM | POA: Diagnosis not present

## 2020-12-25 DIAGNOSIS — I8312 Varicose veins of left lower extremity with inflammation: Secondary | ICD-10-CM | POA: Diagnosis not present

## 2020-12-25 DIAGNOSIS — L308 Other specified dermatitis: Secondary | ICD-10-CM | POA: Diagnosis not present

## 2020-12-25 DIAGNOSIS — L821 Other seborrheic keratosis: Secondary | ICD-10-CM | POA: Diagnosis not present

## 2020-12-25 DIAGNOSIS — I8311 Varicose veins of right lower extremity with inflammation: Secondary | ICD-10-CM | POA: Diagnosis not present

## 2020-12-25 DIAGNOSIS — I872 Venous insufficiency (chronic) (peripheral): Secondary | ICD-10-CM | POA: Diagnosis not present

## 2020-12-27 ENCOUNTER — Other Ambulatory Visit: Payer: Self-pay | Admitting: Family Medicine

## 2020-12-27 DIAGNOSIS — I10 Essential (primary) hypertension: Secondary | ICD-10-CM

## 2020-12-27 DIAGNOSIS — M2550 Pain in unspecified joint: Secondary | ICD-10-CM

## 2021-01-21 ENCOUNTER — Telehealth: Payer: Self-pay

## 2021-01-21 NOTE — Telephone Encounter (Signed)
Patient called to scheduled 6 month follow up on 11/21 and stated that he is feeling fine and if he doesn't need to come in for appt it can be change to virtual visit.

## 2021-01-21 NOTE — Telephone Encounter (Signed)
Patient informed of the message below.  Patient stated he has a BP cuff at home and the appt type was changed to a virtual visiit.

## 2021-01-21 NOTE — Telephone Encounter (Signed)
As long as he is able to check bp at home that is fine.

## 2021-02-03 DIAGNOSIS — Z23 Encounter for immunization: Secondary | ICD-10-CM | POA: Diagnosis not present

## 2021-03-09 ENCOUNTER — Telehealth: Payer: Medicare Other | Admitting: Family Medicine

## 2021-03-27 ENCOUNTER — Other Ambulatory Visit: Payer: Self-pay | Admitting: Family Medicine

## 2021-03-27 DIAGNOSIS — I10 Essential (primary) hypertension: Secondary | ICD-10-CM

## 2021-04-09 ENCOUNTER — Telehealth: Payer: Self-pay | Admitting: Family Medicine

## 2021-04-09 DIAGNOSIS — E230 Hypopituitarism: Secondary | ICD-10-CM | POA: Diagnosis not present

## 2021-04-09 DIAGNOSIS — H5203 Hypermetropia, bilateral: Secondary | ICD-10-CM | POA: Diagnosis not present

## 2021-04-09 DIAGNOSIS — C61 Malignant neoplasm of prostate: Secondary | ICD-10-CM | POA: Diagnosis not present

## 2021-04-09 DIAGNOSIS — H353131 Nonexudative age-related macular degeneration, bilateral, early dry stage: Secondary | ICD-10-CM | POA: Diagnosis not present

## 2021-04-09 DIAGNOSIS — H52203 Unspecified astigmatism, bilateral: Secondary | ICD-10-CM | POA: Diagnosis not present

## 2021-04-09 NOTE — Telephone Encounter (Signed)
Spoke with patient to schedule Medicare Annual Wellness Visit (AWV) either virtually or in office.   Patient declined He stated he was not interested    Last AWV ; 10/04/16

## 2021-04-16 DIAGNOSIS — R3915 Urgency of urination: Secondary | ICD-10-CM | POA: Diagnosis not present

## 2021-04-16 DIAGNOSIS — C61 Malignant neoplasm of prostate: Secondary | ICD-10-CM | POA: Diagnosis not present

## 2021-04-16 DIAGNOSIS — E230 Hypopituitarism: Secondary | ICD-10-CM | POA: Diagnosis not present

## 2021-05-05 ENCOUNTER — Telehealth: Payer: Self-pay | Admitting: *Deleted

## 2021-05-05 NOTE — Telephone Encounter (Signed)
[facility] faxed a non-adherence therapy advisory form for Amlodipine 10mg  and Dr Ethlyn Gallery stated the patient is overdue for a follow up appt.  I left a detailed message at the patient's cell number to call the office to schedule an appt.

## 2021-05-22 ENCOUNTER — Telehealth: Payer: Self-pay | Admitting: *Deleted

## 2021-05-22 ENCOUNTER — Ambulatory Visit (INDEPENDENT_AMBULATORY_CARE_PROVIDER_SITE_OTHER): Payer: Medicare Other | Admitting: Family Medicine

## 2021-05-22 ENCOUNTER — Encounter: Payer: Self-pay | Admitting: Family Medicine

## 2021-05-22 VITALS — BP 138/70 | HR 93 | Temp 99.2°F | Ht 68.0 in | Wt 231.2 lb

## 2021-05-22 DIAGNOSIS — M2550 Pain in unspecified joint: Secondary | ICD-10-CM | POA: Diagnosis not present

## 2021-05-22 DIAGNOSIS — E790 Hyperuricemia without signs of inflammatory arthritis and tophaceous disease: Secondary | ICD-10-CM | POA: Diagnosis not present

## 2021-05-22 DIAGNOSIS — R5383 Other fatigue: Secondary | ICD-10-CM

## 2021-05-22 DIAGNOSIS — E538 Deficiency of other specified B group vitamins: Secondary | ICD-10-CM | POA: Diagnosis not present

## 2021-05-22 DIAGNOSIS — E785 Hyperlipidemia, unspecified: Secondary | ICD-10-CM | POA: Diagnosis not present

## 2021-05-22 DIAGNOSIS — I10 Essential (primary) hypertension: Secondary | ICD-10-CM

## 2021-05-22 DIAGNOSIS — G4700 Insomnia, unspecified: Secondary | ICD-10-CM

## 2021-05-22 DIAGNOSIS — R7301 Impaired fasting glucose: Secondary | ICD-10-CM | POA: Diagnosis not present

## 2021-05-22 DIAGNOSIS — M4810 Ankylosing hyperostosis [Forestier], site unspecified: Secondary | ICD-10-CM | POA: Diagnosis not present

## 2021-05-22 LAB — CBC WITH DIFFERENTIAL/PLATELET
Basophils Absolute: 0 10*3/uL (ref 0.0–0.1)
Basophils Relative: 0.5 % (ref 0.0–3.0)
Eosinophils Absolute: 0.1 10*3/uL (ref 0.0–0.7)
Eosinophils Relative: 1.8 % (ref 0.0–5.0)
HCT: 44.3 % (ref 39.0–52.0)
Hemoglobin: 15.1 g/dL (ref 13.0–17.0)
Lymphocytes Relative: 21.8 % (ref 12.0–46.0)
Lymphs Abs: 1.6 10*3/uL (ref 0.7–4.0)
MCHC: 34.2 g/dL (ref 30.0–36.0)
MCV: 93.1 fl (ref 78.0–100.0)
Monocytes Absolute: 0.5 10*3/uL (ref 0.1–1.0)
Monocytes Relative: 6.9 % (ref 3.0–12.0)
Neutro Abs: 5.2 10*3/uL (ref 1.4–7.7)
Neutrophils Relative %: 69 % (ref 43.0–77.0)
Platelets: 252 10*3/uL (ref 150.0–400.0)
RBC: 4.76 Mil/uL (ref 4.22–5.81)
RDW: 13 % (ref 11.5–15.5)
WBC: 7.5 10*3/uL (ref 4.0–10.5)

## 2021-05-22 LAB — LIPID PANEL
Cholesterol: 203 mg/dL — ABNORMAL HIGH (ref 0–200)
HDL: 45.3 mg/dL (ref >39.00)
NonHDL: 158.05
Total CHOL/HDL Ratio: 4
Triglycerides: 379 mg/dL — ABNORMAL HIGH (ref 0.0–149.0)
VLDL: 75.8 mg/dL — ABNORMAL HIGH (ref 0.0–40.0)

## 2021-05-22 LAB — COMPREHENSIVE METABOLIC PANEL
ALT: 40 U/L (ref 0–53)
AST: 32 U/L (ref 0–37)
Albumin: 4.3 g/dL (ref 3.5–5.2)
Alkaline Phosphatase: 17 U/L — ABNORMAL LOW (ref 39–117)
BUN: 16 mg/dL (ref 6–23)
CO2: 31 mEq/L (ref 19–32)
Calcium: 10 mg/dL (ref 8.4–10.5)
Chloride: 99 mEq/L (ref 96–112)
Creatinine, Ser: 1.14 mg/dL (ref 0.40–1.50)
GFR: 66.35 mL/min (ref >60.00)
Glucose, Bld: 158 mg/dL — ABNORMAL HIGH (ref 70–99)
Potassium: 4.1 mEq/L (ref 3.5–5.1)
Sodium: 138 mEq/L (ref 135–145)
Total Bilirubin: 0.6 mg/dL (ref 0.2–1.2)
Total Protein: 7.2 g/dL (ref 6.0–8.3)

## 2021-05-22 LAB — VITAMIN B12: Vitamin B-12: 576 pg/mL (ref 211–911)

## 2021-05-22 LAB — SEDIMENTATION RATE: Sed Rate: 21 mm/hr — ABNORMAL HIGH (ref 0–20)

## 2021-05-22 LAB — HEMOGLOBIN A1C: Hgb A1c MFr Bld: 5.9 % (ref 4.6–6.5)

## 2021-05-22 LAB — URIC ACID: Uric Acid, Serum: 7.2 mg/dL (ref 4.0–7.8)

## 2021-05-22 LAB — FOLATE: Folate: 19.9 ng/mL (ref >5.9)

## 2021-05-22 LAB — LDL CHOLESTEROL, DIRECT: Direct LDL: 125 mg/dL

## 2021-05-22 MED ORDER — TRAMADOL HCL 50 MG PO TABS: ORAL_TABLET | ORAL | 0 refills | Status: DC

## 2021-05-22 MED ORDER — METOPROLOL TARTRATE 100 MG PO TABS: 100.0000 mg | ORAL_TABLET | Freq: Two times a day (BID) | ORAL | 1 refills | Status: DC

## 2021-05-22 MED ORDER — MELOXICAM 7.5 MG PO TABS: 7.5000 mg | ORAL_TABLET | Freq: Every day | ORAL | 1 refills | Status: DC | PRN

## 2021-05-22 MED ORDER — HYDROCODONE-ACETAMINOPHEN 5-325 MG PO TABS: 1.0000 | ORAL_TABLET | Freq: Two times a day (BID) | ORAL | 0 refills | Status: DC | PRN

## 2021-05-22 MED ORDER — ALLOPURINOL 100 MG PO TABS: 100.0000 mg | ORAL_TABLET | Freq: Every day | ORAL | 3 refills | Status: DC

## 2021-05-22 MED ORDER — TRAZODONE HCL 50 MG PO TABS: 25.0000 mg | ORAL_TABLET | Freq: Every evening | ORAL | 3 refills | Status: DC | PRN

## 2021-05-22 MED ORDER — AMLODIPINE BESYLATE 10 MG PO TABS: 10.0000 mg | ORAL_TABLET | Freq: Every day | ORAL | 3 refills | Status: DC

## 2021-05-22 MED ORDER — PREDNISONE 20 MG PO TABS: ORAL_TABLET | ORAL | 2 refills | Status: DC

## 2021-05-22 NOTE — Telephone Encounter (Signed)
Patient came back in to the office stating he needs refills on the following medications called in to [facility]: -Hydrocodone -Meloxicam -Metoprolol -Prednisone -Tramadol  Message sent to PCP.

## 2021-05-22 NOTE — Telephone Encounter (Signed)
Noted  

## 2021-05-22 NOTE — Progress Notes (Signed)
Luis Fisher DOB: 06/10/1955 Encounter date: 05/22/2021  This is a 66 y.o. male who presents with Chief Complaint  Patient presents with   Medication Refill    History of present illness: Last visit was 09/12/2020 for physical.  DISH: Tramadol, hydrocodone, prednisone for flares. Ability to walk keeps degrading. This greatly affects his quality of life. Uses electric buggy at grocery. At park wife will park by bench and he can just walk to bench. Feels weak overall and joints are hurting. Sometimes uses crutches or walker in house, canes when needed. Knows he will be dependent on electric scooter soon. Last week could barely walk to bathroom (had flare) - did a short prednisone 40,30,20 burst and then was back to his normal. Really tries not to take the prednisone unless he is in that flare. Has tried the pain medication, but doesn't like way it makes him feel. Just a little tired after taking medication. Harder to focus. Maybe 3 times a week will take a tramadol. 1-2 times/month will take hydrocodone. Doesn't take together. Spaces days apart. Just worries about over medicating. Flares do tend to move around - right hand (will get 30% bigger), right foot, left foot, has felt like morton's neuroma but worse. Knees also seem to be bothering him - top and behind patella. Also lateral knees. Get very sore. Has seen 4 rheumatologists in past. Sort of lost faith. Was too worried about suppressed immune system that he never wanted to try biologics. Really would rather just deal with pain than take additional medications or go through more testing. Used to get massages which were great, but neck work set off vertigo. Wants to avoid additional radiation.   Completed cologuard 10/06/20: negative  Impaired fasting sugar: Last A1c was 5.9 on 09/12/2020.  Hypertension: Amlodipine 10 mg daily.  Insurance noted that he was noncompliant with medication refills.  Metoprolol 100 mg daily. States that he has been  compliant with medication. Bottle he had stated that he had 2 refills, but pharmacy stated the rx had expired.   PSA was 0.3 with last bloodwork. He had discussed testosterone replacement with them, but worried about cancer potential. Last visit was around end of December.   Usually up at least every 1-2 hours through night. Prostate issues wake him, but pain issues also wake him. With hydrocodone he will sleep 4 hours straight. Also has hard time with distinguishing urgency of BM. If going to be out will take 2 imodiums.   Saw dermatology since last visit. Skin above ankles had changed; once he noted on both legs he was concerned. Derm told him that swelling of legs was contributing.   Allergies  Allergen Reactions   Atorvastatin Other (See Comments)    Muscle aches, memory loss   Contrast Media [Iodinated Contrast Media] Other (See Comments)    Oral IV Contrast, Convulsions   Current Meds  Medication Sig   aspirin 325 MG tablet Take 325 mg by mouth daily. Reports taking aspirin 2-3 times per week   clobetasol cream (TEMOVATE) 0.05 % Apply topically 2 (two) times daily as needed.   fluticasone (FLONASE) 50 MCG/ACT nasal spray USE 1-2 SPRAYS IN EACH NOSTRIL DAILY AS NEEDED.   lidocaine (LIDODERM) 5 % APPLY 1 PATCH TO THE SKIN DAILY. REMOVE & DISCARD PATCH WITHIN 12 HOURS OR AS DIRECTED BY MD.   traZODone (DESYREL) 50 MG tablet Take 0.5-1 tablets (25-50 mg total) by mouth at bedtime as needed for sleep.   [DISCONTINUED] allopurinol (ZYLOPRIM) 100  MG tablet TAKE ONE TABLET BY MOUTH ONE TIME DAILY   [DISCONTINUED] amLODipine (NORVASC) 10 MG tablet TAKE ONE TABLET BY MOUTH ONE TIME DAILY   [DISCONTINUED] HYDROcodone-acetaminophen (NORCO) 5-325 MG tablet Take 1 tablet by mouth 2 (two) times daily as needed for moderate pain or severe pain.   [DISCONTINUED] meloxicam (MOBIC) 7.5 MG tablet TAKE ONE TABLET BY MOUTH ONE TIME DAILY   [DISCONTINUED] metoprolol tartrate (LOPRESSOR) 100 MG tablet TAKE  ONE TABLET BY MOUTH TWICE DAILY   [DISCONTINUED] predniSONE (DELTASONE) 20 MG tablet TAKE ONE TABLET BY MOUTH TWICE DAILY AS NEEDED FOR ARTHRITIS FLARE   [DISCONTINUED] traMADol (ULTRAM) 50 MG tablet Take 1 to 2 tablets by mouth every 6 hours as needed for moderate pain    Review of Systems  Constitutional:  Negative for chills, fatigue and fever.  Respiratory:  Negative for cough, chest tightness, shortness of breath and wheezing.   Cardiovascular:  Negative for chest pain, palpitations and leg swelling.  Musculoskeletal:  Positive for arthralgias, back pain, gait problem, joint swelling, neck pain and neck stiffness.   Objective:  BP 138/70    Pulse 93    Temp 99.2 F (37.3 C) (Oral)    Ht 5\' 8"  (1.727 m)    Wt 231 lb 3.2 oz (104.9 kg)    SpO2 97%    BMI 35.15 kg/m   Weight: 231 lb 3.2 oz (104.9 kg)   BP Readings from Last 3 Encounters:  05/22/21 138/70  09/12/20 140/78  03/12/20 130/68   Wt Readings from Last 3 Encounters:  05/22/21 231 lb 3.2 oz (104.9 kg)  09/12/20 233 lb 8 oz (105.9 kg)  03/12/20 230 lb 6.4 oz (104.5 kg)    Physical Exam Constitutional:      General: He is not in acute distress.    Appearance: He is well-developed.  Cardiovascular:     Rate and Rhythm: Normal rate and regular rhythm.     Heart sounds: Normal heart sounds. No murmur heard.   No friction rub.  Pulmonary:     Effort: Pulmonary effort is normal. No respiratory distress.     Breath sounds: Normal breath sounds. No wheezing or rales.  Musculoskeletal:     Right lower leg: No edema.     Left lower leg: No edema.     Comments: Minimal lateral movement of neck; almost no vertical movement. No twisting spine. No extension ability.   Neurological:     Mental Status: He is alert and oriented to person, place, and time.     Gait: Gait abnormal (slow, shuffling).  Psychiatric:        Behavior: Behavior normal.    Assessment/Plan   1. DISH (diffuse idiopathic skeletal hyperostosis) His  impairment from spinal/joint standpoint seems to be worsening. He does not want to go through more evaluation however; specifically tired of imaging studies. We discussed that problems may be different than they were in the past (last specialty eval was over 10 years ago). I wonder about more autoimmune component for him. He does have severe pain flares that improve with prednisone. Discussed that he may really benefit from biologics, but he worries too much about potential side effects on these medications and does not wish to pursue further evaluation with specialist.  - Sedimentation rate; Future - Sedimentation rate - predniSONE (DELTASONE) 20 MG tablet; TAKE ONE TABLET BY MOUTH TWICE DAILY AS NEEDED FOR ARTHRITIS FLARE  Dispense: 30 tablet; Refill: 2  2. Essential hypertension Bp has been well  controlled. Continue with medication. He states discrepancy between what his pill bottle says and what pharmacy says. He states he takes meds daily. - amLODipine (NORVASC) 10 MG tablet; Take 1 tablet (10 mg total) by mouth daily.  Dispense: 90 tablet; Refill: 3 - CBC with Differential/Platelet; Future - Comprehensive metabolic panel; Future - Comprehensive metabolic panel - CBC with Differential/Platelet - metoprolol tartrate (LOPRESSOR) 100 MG tablet; Take 1 tablet (100 mg total) by mouth 2 (two) times daily.  Dispense: 180 tablet; Refill: 1 - traMADol (ULTRAM) 50 MG tablet; Take 1 to 2 tablets by mouth every 6 hours as needed for moderate pain  Dispense: 120 tablet; Refill: 0  3. Elevated uric acid in blood Recheck levels.  - allopurinol (ZYLOPRIM) 100 MG tablet; Take 1 tablet (100 mg total) by mouth daily.  Dispense: 90 tablet; Refill: 3 - Uric acid; Future - Uric acid  4. Impaired fasting glucose - Hemoglobin A1c; Future - Hemoglobin A1c  5. Insomnia, unspecified type Trial trazodone. He sleeps better with pain medication, but doesn't want to take daily/nightly. - traZODone (DESYREL) 50 MG  tablet; Take 0.5-1 tablets (25-50 mg total) by mouth at bedtime as needed for sleep.  Dispense: 30 tablet; Refill: 3  6. Hyperlipidemia, unspecified hyperlipidemia type - Lipid panel; Future - Lipid panel  7. Other fatigue - Vitamin B12; Future - Folate; Future - Folate - Vitamin B12  8. B12 deficiency - Vitamin B12; Future - Folate; Future - Folate - Vitamin B12  9. Arthralgia, unspecified joint - traMADol (ULTRAM) 50 MG tablet; Take 1 to 2 tablets by mouth every 6 hours as needed for moderate pain  Dispense: 120 tablet; Refill: 0 Uses hydrocodone for more severe pain days.   Return in about 6 months (around 11/19/2021) for physical exam. 45 minutes spent in chart review, time with patient, charting, follow up plan/option discussion.   Micheline Rough, MD

## 2021-05-22 NOTE — Telephone Encounter (Signed)
All have been refilled.  

## 2021-05-22 NOTE — Patient Instructions (Signed)
Consider prevnar 20 with your next shingles vaccination.

## 2021-05-26 ENCOUNTER — Telehealth: Payer: Self-pay | Admitting: Family Medicine

## 2021-05-26 NOTE — Telephone Encounter (Signed)
Luis Fisher from [employer] call and want a call back with the day supply.

## 2021-05-26 NOTE — Telephone Encounter (Signed)
I called [facility] and spoke with Joellen Jersey for the name of the prescription they were referring to.  Per Joellen Jersey, she reviewed the Rx with the pharmacist and they are aware the Rx is for 15 days per sigs, quantity and stated to disregard the call.

## 2021-06-01 ENCOUNTER — Encounter: Payer: Self-pay | Admitting: Family Medicine

## 2021-06-01 NOTE — Progress Notes (Signed)
Journal links sent to patient supporting evidence for treatment

## 2021-07-06 NOTE — Telephone Encounter (Signed)
error 

## 2021-08-17 ENCOUNTER — Other Ambulatory Visit: Payer: Self-pay | Admitting: Family Medicine

## 2021-08-17 DIAGNOSIS — M2550 Pain in unspecified joint: Secondary | ICD-10-CM

## 2021-08-17 DIAGNOSIS — I10 Essential (primary) hypertension: Secondary | ICD-10-CM

## 2021-10-12 DIAGNOSIS — C61 Malignant neoplasm of prostate: Secondary | ICD-10-CM | POA: Diagnosis not present

## 2021-10-12 DIAGNOSIS — E230 Hypopituitarism: Secondary | ICD-10-CM | POA: Diagnosis not present

## 2021-10-19 DIAGNOSIS — E230 Hypopituitarism: Secondary | ICD-10-CM | POA: Diagnosis not present

## 2021-10-19 DIAGNOSIS — R3915 Urgency of urination: Secondary | ICD-10-CM | POA: Diagnosis not present

## 2021-10-19 DIAGNOSIS — C61 Malignant neoplasm of prostate: Secondary | ICD-10-CM | POA: Diagnosis not present

## 2021-11-12 ENCOUNTER — Other Ambulatory Visit: Payer: Self-pay | Admitting: *Deleted

## 2021-11-12 DIAGNOSIS — M2550 Pain in unspecified joint: Secondary | ICD-10-CM

## 2021-11-12 DIAGNOSIS — I10 Essential (primary) hypertension: Secondary | ICD-10-CM

## 2021-11-16 ENCOUNTER — Other Ambulatory Visit: Payer: Self-pay | Admitting: Family

## 2021-11-16 ENCOUNTER — Telehealth: Payer: Self-pay | Admitting: Family Medicine

## 2021-11-16 DIAGNOSIS — I10 Essential (primary) hypertension: Secondary | ICD-10-CM

## 2021-11-16 MED ORDER — METOPROLOL TARTRATE 100 MG PO TABS: 100.0000 mg | ORAL_TABLET | Freq: Two times a day (BID) | ORAL | 0 refills | Status: DC

## 2021-11-16 NOTE — Telephone Encounter (Signed)
Pt called stating he called the pharmacy and they are still saying there is nothing received for his refill of metoprolol tartrate (LOPRESSOR) 100 MG tablet  [id number] - [place], [place] Phone:  [phone]  Fax:  [phone]     Please advise.  Also, Pt is scheduled for a TOC on 12/10/21 with Dr. Legrand Como.

## 2021-11-16 NOTE — Telephone Encounter (Signed)
Patient informed of the message below.

## 2021-11-19 ENCOUNTER — Other Ambulatory Visit: Payer: Self-pay | Admitting: *Deleted

## 2021-11-19 DIAGNOSIS — M2550 Pain in unspecified joint: Secondary | ICD-10-CM

## 2021-11-19 DIAGNOSIS — I10 Essential (primary) hypertension: Secondary | ICD-10-CM

## 2021-11-20 NOTE — Telephone Encounter (Signed)
I cannot refill this until his in person visit since it is  a controlled substance

## 2021-12-10 ENCOUNTER — Ambulatory Visit (INDEPENDENT_AMBULATORY_CARE_PROVIDER_SITE_OTHER): Payer: Medicare Other | Admitting: Family Medicine

## 2021-12-10 ENCOUNTER — Encounter: Payer: Self-pay | Admitting: Family Medicine

## 2021-12-10 VITALS — BP 124/58 | HR 80 | Temp 98.2°F | Ht 68.0 in | Wt 225.7 lb

## 2021-12-10 DIAGNOSIS — R7301 Impaired fasting glucose: Secondary | ICD-10-CM | POA: Diagnosis not present

## 2021-12-10 DIAGNOSIS — M4810 Ankylosing hyperostosis [Forestier], site unspecified: Secondary | ICD-10-CM

## 2021-12-10 DIAGNOSIS — I10 Essential (primary) hypertension: Secondary | ICD-10-CM | POA: Diagnosis not present

## 2021-12-10 DIAGNOSIS — M2550 Pain in unspecified joint: Secondary | ICD-10-CM | POA: Diagnosis not present

## 2021-12-10 LAB — POCT GLYCOSYLATED HEMOGLOBIN (HGB A1C): Hemoglobin A1C: 5.3 % (ref 4.0–5.6)

## 2021-12-10 MED ORDER — MELOXICAM 7.5 MG PO TABS: 7.5000 mg | ORAL_TABLET | Freq: Every day | ORAL | 1 refills | Status: DC | PRN

## 2021-12-10 MED ORDER — METOPROLOL TARTRATE 100 MG PO TABS
100.0000 mg | ORAL_TABLET | Freq: Two times a day (BID) | ORAL | 1 refills | Status: DC
Start: 2021-12-10 — End: 2022-08-11

## 2021-12-10 MED ORDER — TRAMADOL HCL 50 MG PO TABS: ORAL_TABLET | ORAL | 0 refills | Status: DC

## 2021-12-10 MED ORDER — HYDROCODONE-ACETAMINOPHEN 5-325 MG PO TABS: 1.0000 | ORAL_TABLET | Freq: Two times a day (BID) | ORAL | 0 refills | Status: DC | PRN

## 2021-12-10 NOTE — Progress Notes (Signed)
Established Patient Office Visit  Subjective   Patient ID: Luis Fisher, male    DOB: 1956/02/27  Age: 66 y.o. MRN: [id number]  Chief Complaint  Patient presents with   Establish Care   Eye Problem    Patient complains of the sensation of a "gelatinous film and curvy thing" noted in the lateral field of vision in the right eye x1 month   Headache    Patient complains of right-sided headaches x1 month, especially noted when driving   Hypothyroidism    Patient is here for transition of care visit. His last visit was in February 2023. Patient states that he is in his usual state of health.  DISH-- patient reports that he is progressively having more trouble with ambulation, states that every year it seems to get worse and worse. States he is mostly using the tramadol 2-3 times per week, rarely takes the hydrocodone. States that he takes 1/2 dose initially, then if his arthritis is still bothering him he will take another 1/2 tablet.   Patient is reporting floaters in his vision on the right side that is chronic. States that it comes and goes, will be there for about 20 minutes or so then disappears, states that it is always in the right upper quadrant of the right eye. States he already has a referral to the ophthalmologist. States that he will occasionally get headaches on the right side of his head as well, happens mostly when he is driving but thinks it might be brought in by nervousness.   Prediabetes-- patient is not currently on medication for this. States he has not made any real dietary changes, cannot exercise due to his DISH. A1C performed in office today and shows improvement, down to 5.3 which is in the normal range.   HTN- patient reports compliance with his medications. Reports no chest pain or SOB beyond his normal functioning. Occasionally checks BP at home.   Eye Problem  The right eye is affected. This is a chronic problem. The problem occurs constantly. The problem has been  unchanged. Pertinent negatives include no eye discharge, double vision, foreign body sensation, nausea or photophobia.  Headache  Pertinent negatives include no nausea or photophobia.   Current Outpatient Medications  Medication Instructions   allopurinol (ZYLOPRIM) 100 mg, Oral, Daily   amLODipine (NORVASC) 10 mg, Oral, Daily   aspirin 325 mg, Oral, Daily, Reports taking aspirin 2-3 times per week    clobetasol cream (TEMOVATE) 0.05 % Topical, 2 times daily PRN   fluticasone (FLONASE) 50 MCG/ACT nasal spray USE 1-2 SPRAYS IN EACH NOSTRIL DAILY AS NEEDED.   HYDROcodone-acetaminophen (NORCO) 5-325 MG tablet 1 tablet, Oral, 2 times daily PRN   lidocaine (LIDODERM) 5 % APPLY 1 PATCH TO THE SKIN DAILY. REMOVE & DISCARD PATCH WITHIN 12 HOURS OR AS DIRECTED BY MD.   meloxicam (MOBIC) 7.5 mg, Oral, Daily PRN   metoprolol tartrate (LOPRESSOR) 100 mg, Oral, 2 times daily   predniSONE (DELTASONE) 20 MG tablet TAKE ONE TABLET BY MOUTH TWICE DAILY AS NEEDED FOR ARTHRITIS FLARE   traMADol (ULTRAM) 50 MG tablet TAKE ONE OR TWO TABLETS BY MOUTH EVERY SIX HOURS AS NEEDED FOR MODERATE PAIN     Patient Active Problem List   Diagnosis Date Noted   Prostate cancer ([place]) 02/16/2016   Nephrolithiasis 06/24/2015   History of diverticulitis 06/24/2015   Postoperative anemia due to acute blood loss 01/02/2013   OA (osteoarthritis) of hip 01/01/2013   Testosterone deficiency 06/18/2009  DEPRESSION 02/01/2008   DISH (diffuse idiopathic skeletal hyperostosis) 06/16/2007   History of colonic polyps 06/16/2007   Essential hypertension 01/16/2007      Review of Systems  Eyes:  Negative for double vision, photophobia and discharge.  Gastrointestinal:  Negative for nausea.  Neurological:  Positive for headaches.  All other systems reviewed and are negative.     Objective:     BP (!) 124/58 (BP Location: Left Arm, Patient Position: Sitting, Cuff Size: Large)   Pulse 80   Temp 98.2 F (36.8 C) (Oral)    Ht '5\' 8"'$  (1.727 m)   Wt 225 lb 11.2 oz (102.4 kg)   SpO2 98%   BMI 34.32 kg/m  BP Readings from Last 3 Encounters:  12/10/21 (!) 124/58  05/22/21 138/70  09/12/20 140/78   Wt Readings from Last 3 Encounters:  12/10/21 225 lb 11.2 oz (102.4 kg)  05/22/21 231 lb 3.2 oz (104.9 kg)  09/12/20 233 lb 8 oz (105.9 kg)      Physical Exam Vitals reviewed.  Constitutional:      Appearance: Normal appearance. He is well-groomed and normal weight.  HENT:     Head: Normocephalic and atraumatic.  Eyes:     Extraocular Movements: Extraocular movements intact.     Conjunctiva/sclera: Conjunctivae normal.     Pupils: Pupils are equal, round, and reactive to light.  Cardiovascular:     Rate and Rhythm: Normal rate and regular rhythm.     Pulses: Normal pulses.     Heart sounds: S1 normal and S2 normal. No murmur heard. Pulmonary:     Effort: Pulmonary effort is normal.     Breath sounds: Normal breath sounds and air entry. No rales.  Abdominal:     General: Abdomen is flat. Bowel sounds are normal.     Palpations: Abdomen is soft.     Tenderness: There is no abdominal tenderness.  Musculoskeletal:     Right lower leg: No edema.     Left lower leg: No edema.  Neurological:     General: No focal deficit present.     Mental Status: He is alert and oriented to person, place, and time.     Gait: Gait is intact.     Deep Tendon Reflexes: Reflexes are normal and symmetric.  Psychiatric:        Mood and Affect: Mood and affect normal.      Results for orders placed or performed in visit on 12/10/21  POC HgB A1c  Result Value Ref Range   Hemoglobin A1C 5.3 4.0 - 5.6 %   HbA1c POC (<> result, manual entry)     HbA1c, POC (prediabetic range)     HbA1c, POC (controlled diabetic range)      Last hemoglobin A1c Lab Results  Component Value Date   HGBA1C 5.3 12/10/2021      The 10-year ASCVD risk score (Arnett DK, et al., 2019) is: 32.3%    Assessment & Plan:   Problem List  Items Addressed This Visit       Cardiovascular and Mediastinum   Essential hypertension    Current hypertension medications:       Sig   amLODipine (NORVASC) 10 MG tablet (Taking) Take 1 tablet (10 mg total) by mouth daily.   metoprolol tartrate (LOPRESSOR) 100 MG tablet (Taking) Take 1 tablet (100 mg total) by mouth 2 (two) times daily.    Patient is well controlled on these medications. Will continue them.  Relevant Medications   metoprolol tartrate (LOPRESSOR) 100 MG tablet     Musculoskeletal and Integument   DISH (diffuse idiopathic skeletal hyperostosis)    Patient has had this diagnosis for 20+ years, reports the medications he is taking PRN are working to control his pain symptoms. He has very limited mobility due to this condition. We discussed the risks/benefits of the tramadol and hydrocodone and I reviewed his PDMP. Will refill these medications for him and see him back in 6 months.      Relevant Medications   traMADol (ULTRAM) 50 MG tablet   HYDROcodone-acetaminophen (NORCO) 5-325 MG tablet   meloxicam (MOBIC) 7.5 MG tablet   Other Visit Diagnoses     Impaired fasting glucose    -  Primary   Relevant Orders   POC HgB A1c (Completed)   A1C today is in the normal range. We discussed reducing sugar and starches in his diet. It might also be the PRN prednisone he has been taking at home that is causing the slight elevation. I will continue to monitor his A1C every 6 months. We also discussed cardiac risk factors and his 10 year CVD risk score. Patient has adverse reaction to statins in the past...       Return in about 6 months (around 06/12/2022) for Follow up for annual visit.    Farrel Conners, MD

## 2021-12-10 NOTE — Assessment & Plan Note (Signed)
Patient has had this diagnosis for 20+ years, reports the medications he is taking PRN are working to control his pain symptoms. He has very limited mobility due to this condition. We discussed the risks/benefits of the tramadol and hydrocodone and I reviewed his PDMP. Will refill these medications for him and see him back in 6 months.

## 2021-12-10 NOTE — Assessment & Plan Note (Signed)
Current hypertension medications:      Sig   amLODipine (NORVASC) 10 MG tablet (Taking) Take 1 tablet (10 mg total) by mouth daily.   metoprolol tartrate (LOPRESSOR) 100 MG tablet (Taking) Take 1 tablet (100 mg total) by mouth 2 (two) times daily.     Patient is well controlled on these medications. Will continue them.

## 2022-01-16 ENCOUNTER — Inpatient Hospital Stay (HOSPITAL_COMMUNITY): Payer: Medicare Other

## 2022-01-16 ENCOUNTER — Inpatient Hospital Stay (HOSPITAL_COMMUNITY)
Admission: EM | Admit: 2022-01-16 | Discharge: 2022-01-18 | DRG: 063 | Disposition: A | Discharge status: Home Health | Payer: Medicare Other | Attending: Neurology | Admitting: Neurology

## 2022-01-16 ENCOUNTER — Emergency Department (HOSPITAL_COMMUNITY): Payer: Medicare Other

## 2022-01-16 ENCOUNTER — Other Ambulatory Visit: Payer: Self-pay

## 2022-01-16 ENCOUNTER — Encounter (HOSPITAL_COMMUNITY): Payer: Self-pay

## 2022-01-16 DIAGNOSIS — Z7982 Long term (current) use of aspirin: Secondary | ICD-10-CM | POA: Diagnosis not present

## 2022-01-16 DIAGNOSIS — R29810 Facial weakness: Secondary | ICD-10-CM | POA: Diagnosis present

## 2022-01-16 DIAGNOSIS — Z79899 Other long term (current) drug therapy: Secondary | ICD-10-CM

## 2022-01-16 DIAGNOSIS — G255 Other chorea: Secondary | ICD-10-CM | POA: Diagnosis present

## 2022-01-16 DIAGNOSIS — Z8719 Personal history of other diseases of the digestive system: Secondary | ICD-10-CM

## 2022-01-16 DIAGNOSIS — R4781 Slurred speech: Secondary | ICD-10-CM | POA: Diagnosis not present

## 2022-01-16 DIAGNOSIS — M4810 Ankylosing hyperostosis [Forestier], site unspecified: Secondary | ICD-10-CM | POA: Diagnosis present

## 2022-01-16 DIAGNOSIS — R29700 NIHSS score 0: Secondary | ICD-10-CM | POA: Diagnosis present

## 2022-01-16 DIAGNOSIS — Z8249 Family history of ischemic heart disease and other diseases of the circulatory system: Secondary | ICD-10-CM

## 2022-01-16 DIAGNOSIS — R4701 Aphasia: Secondary | ICD-10-CM | POA: Diagnosis not present

## 2022-01-16 DIAGNOSIS — E669 Obesity, unspecified: Secondary | ICD-10-CM | POA: Diagnosis present

## 2022-01-16 DIAGNOSIS — I6782 Cerebral ischemia: Secondary | ICD-10-CM | POA: Diagnosis not present

## 2022-01-16 DIAGNOSIS — I6389 Other cerebral infarction: Secondary | ICD-10-CM

## 2022-01-16 DIAGNOSIS — Z87891 Personal history of nicotine dependence: Secondary | ICD-10-CM | POA: Diagnosis not present

## 2022-01-16 DIAGNOSIS — G459 Transient cerebral ischemic attack, unspecified: Secondary | ICD-10-CM | POA: Diagnosis present

## 2022-01-16 DIAGNOSIS — R7303 Prediabetes: Secondary | ICD-10-CM | POA: Diagnosis present

## 2022-01-16 DIAGNOSIS — I639 Cerebral infarction, unspecified: Secondary | ICD-10-CM

## 2022-01-16 DIAGNOSIS — F10129 Alcohol abuse with intoxication, unspecified: Secondary | ICD-10-CM | POA: Diagnosis not present

## 2022-01-16 DIAGNOSIS — M1990 Unspecified osteoarthritis, unspecified site: Secondary | ICD-10-CM | POA: Diagnosis present

## 2022-01-16 DIAGNOSIS — Z96642 Presence of left artificial hip joint: Secondary | ICD-10-CM | POA: Diagnosis present

## 2022-01-16 DIAGNOSIS — E785 Hyperlipidemia, unspecified: Secondary | ICD-10-CM | POA: Diagnosis present

## 2022-01-16 DIAGNOSIS — Z8673 Personal history of transient ischemic attack (TIA), and cerebral infarction without residual deficits: Secondary | ICD-10-CM

## 2022-01-16 DIAGNOSIS — M533 Sacrococcygeal disorders, not elsewhere classified: Secondary | ICD-10-CM | POA: Diagnosis present

## 2022-01-16 DIAGNOSIS — Z881 Allergy status to other antibiotic agents status: Secondary | ICD-10-CM

## 2022-01-16 DIAGNOSIS — F1090 Alcohol use, unspecified, uncomplicated: Secondary | ICD-10-CM | POA: Diagnosis present

## 2022-01-16 DIAGNOSIS — F1010 Alcohol abuse, uncomplicated: Secondary | ICD-10-CM

## 2022-01-16 DIAGNOSIS — Z8546 Personal history of malignant neoplasm of prostate: Secondary | ICD-10-CM | POA: Diagnosis not present

## 2022-01-16 DIAGNOSIS — Z79891 Long term (current) use of opiate analgesic: Secondary | ICD-10-CM | POA: Diagnosis not present

## 2022-01-16 DIAGNOSIS — G319 Degenerative disease of nervous system, unspecified: Secondary | ICD-10-CM | POA: Diagnosis not present

## 2022-01-16 DIAGNOSIS — I6523 Occlusion and stenosis of bilateral carotid arteries: Secondary | ICD-10-CM | POA: Diagnosis not present

## 2022-01-16 DIAGNOSIS — E7800 Pure hypercholesterolemia, unspecified: Secondary | ICD-10-CM | POA: Diagnosis not present

## 2022-01-16 DIAGNOSIS — E039 Hypothyroidism, unspecified: Secondary | ICD-10-CM | POA: Diagnosis present

## 2022-01-16 DIAGNOSIS — I10 Essential (primary) hypertension: Secondary | ICD-10-CM | POA: Diagnosis present

## 2022-01-16 DIAGNOSIS — R000 Tachycardia, unspecified: Secondary | ICD-10-CM | POA: Diagnosis not present

## 2022-01-16 DIAGNOSIS — Z91041 Radiographic dye allergy status: Secondary | ICD-10-CM

## 2022-01-16 DIAGNOSIS — Z6834 Body mass index (BMI) 34.0-34.9, adult: Secondary | ICD-10-CM

## 2022-01-16 DIAGNOSIS — G9389 Other specified disorders of brain: Secondary | ICD-10-CM | POA: Diagnosis not present

## 2022-01-16 DIAGNOSIS — F32A Depression, unspecified: Secondary | ICD-10-CM | POA: Diagnosis present

## 2022-01-16 DIAGNOSIS — R29818 Other symptoms and signs involving the nervous system: Secondary | ICD-10-CM | POA: Diagnosis not present

## 2022-01-16 DIAGNOSIS — Z9282 Status post administration of tPA (rtPA) in a different facility within the last 24 hours prior to admission to current facility: Secondary | ICD-10-CM | POA: Diagnosis not present

## 2022-01-16 DIAGNOSIS — Z713 Dietary counseling and surveillance: Secondary | ICD-10-CM

## 2022-01-16 LAB — VITAMIN B12: Vitamin B-12: 756 pg/mL (ref 180–914)

## 2022-01-16 LAB — CBC
HCT: 46.1 % (ref 39.0–52.0)
Hemoglobin: 16.5 g/dL (ref 13.0–17.0)
MCH: 33.1 pg (ref 26.0–34.0)
MCHC: 35.8 g/dL (ref 30.0–36.0)
MCV: 92.6 fL (ref 80.0–100.0)
Platelets: 208 10*3/uL (ref 150–400)
RBC: 4.98 MIL/uL (ref 4.22–5.81)
RDW: 12.5 % (ref 11.5–15.5)
WBC: 6.7 10*3/uL (ref 4.0–10.5)
nRBC: 0 % (ref 0.0–0.2)

## 2022-01-16 LAB — FOLATE: Folate: 13.7 ng/mL (ref >5.9)

## 2022-01-16 LAB — I-STAT CHEM 8, ED
BUN: 12 mg/dL (ref 8–23)
Calcium, Ion: 1 mmol/L — ABNORMAL LOW (ref 1.15–1.40)
Chloride: 107 mmol/L (ref 98–111)
Creatinine, Ser: 0.9 mg/dL (ref 0.61–1.24)
Glucose, Bld: 103 mg/dL — ABNORMAL HIGH (ref 70–99)
HCT: 47 % (ref 39.0–52.0)
Hemoglobin: 16 g/dL (ref 13.0–17.0)
Potassium: 4 mmol/L (ref 3.5–5.1)
Sodium: 140 mmol/L (ref 135–145)
TCO2: 24 mmol/L (ref 22–32)

## 2022-01-16 LAB — COMPREHENSIVE METABOLIC PANEL
ALT: 34 U/L (ref 0–44)
AST: 38 U/L (ref 15–41)
Albumin: 4 g/dL (ref 3.5–5.0)
Alkaline Phosphatase: 19 U/L — ABNORMAL LOW (ref 38–126)
Anion gap: 13 (ref 5–15)
BUN: 11 mg/dL (ref 8–23)
CO2: 22 mmol/L (ref 22–32)
Calcium: 9.5 mg/dL (ref 8.9–10.3)
Chloride: 105 mmol/L (ref 98–111)
Creatinine, Ser: 1.07 mg/dL (ref 0.61–1.24)
GFR, Estimated: >60 mL/min (ref >60)
Glucose, Bld: 110 mg/dL — ABNORMAL HIGH (ref 70–99)
Potassium: 4.3 mmol/L (ref 3.5–5.1)
Sodium: 140 mmol/L (ref 135–145)
Total Bilirubin: 1.4 mg/dL — ABNORMAL HIGH (ref 0.3–1.2)
Total Protein: 7 g/dL (ref 6.5–8.1)

## 2022-01-16 LAB — LIPID PANEL
Cholesterol: 212 mg/dL — ABNORMAL HIGH (ref 0–200)
HDL: 46 mg/dL (ref >40)
LDL Cholesterol: 128 mg/dL — ABNORMAL HIGH (ref 0–99)
Total CHOL/HDL Ratio: 4.6 RATIO
Triglycerides: 191 mg/dL — ABNORMAL HIGH (ref <150)
VLDL: 38 mg/dL (ref 0–40)

## 2022-01-16 LAB — DIFFERENTIAL
Abs Immature Granulocytes: 0.02 10*3/uL (ref 0.00–0.07)
Basophils Absolute: 0 10*3/uL (ref 0.0–0.1)
Basophils Relative: 0 %
Eosinophils Absolute: 0.1 10*3/uL (ref 0.0–0.5)
Eosinophils Relative: 1 %
Immature Granulocytes: 0 %
Lymphocytes Relative: 39 %
Lymphs Abs: 2.6 10*3/uL (ref 0.7–4.0)
Monocytes Absolute: 0.5 10*3/uL (ref 0.1–1.0)
Monocytes Relative: 8 %
Neutro Abs: 3.4 10*3/uL (ref 1.7–7.7)
Neutrophils Relative %: 52 %

## 2022-01-16 LAB — ECHOCARDIOGRAM COMPLETE
AR max vel: 2.43 cm2
AV Area VTI: 2.47 cm2
AV Area mean vel: 2.35 cm2
AV Mean grad: 5 mmHg
AV Peak grad: 10.1 mmHg
Ao pk vel: 1.59 m/s
Area-P 1/2: 4.06 cm2
Height: 68 in
S' Lateral: 3.6 cm
Weight: 3590.85 oz

## 2022-01-16 LAB — HEMOGLOBIN A1C
Hgb A1c MFr Bld: 5 % (ref 4.8–5.6)
Mean Plasma Glucose: 96.8 mg/dL

## 2022-01-16 LAB — ETHANOL: Alcohol, Ethyl (B): <10 mg/dL (ref <10)

## 2022-01-16 LAB — MRSA NEXT GEN BY PCR, NASAL: MRSA by PCR Next Gen: NOT DETECTED

## 2022-01-16 LAB — CBG MONITORING, ED: Glucose-Capillary: 102 mg/dL — ABNORMAL HIGH (ref 70–99)

## 2022-01-16 LAB — APTT: aPTT: 27 seconds (ref 24–36)

## 2022-01-16 LAB — HIV ANTIBODY (ROUTINE TESTING W REFLEX): HIV Screen 4th Generation wRfx: NONREACTIVE

## 2022-01-16 LAB — PROTIME-INR
INR: 1 (ref 0.8–1.2)
Prothrombin Time: 12.6 seconds (ref 11.4–15.2)

## 2022-01-16 MED ORDER — LORAZEPAM 2 MG/ML IJ SOLN: 2.0000 mg | Freq: Once | INTRAMUSCULAR | Status: AC

## 2022-01-16 MED ORDER — HYDROCODONE-ACETAMINOPHEN 5-325 MG PO TABS
1.0000 | ORAL_TABLET | Freq: Two times a day (BID) | ORAL | Status: DC | PRN
  Administered 2022-01-16: 1 via ORAL
  Filled 2022-01-16: qty 1

## 2022-01-16 MED ORDER — LABETALOL HCL 5 MG/ML IV SOLN: 10.0000 mg | INTRAVENOUS | Status: DC | PRN

## 2022-01-16 MED ORDER — SENNOSIDES-DOCUSATE SODIUM 8.6-50 MG PO TABS: 1.0000 | ORAL_TABLET | Freq: Every evening | ORAL | Status: DC | PRN

## 2022-01-16 MED ORDER — LORAZEPAM 2 MG/ML IJ SOLN: 0.5000 mg | Freq: Once | INTRAMUSCULAR | Status: DC

## 2022-01-16 MED ORDER — TRAMADOL HCL 50 MG PO TABS
50.0000 mg | ORAL_TABLET | Freq: Four times a day (QID) | ORAL | Status: DC | PRN
  Administered 2022-01-17 – 2022-01-18 (×2): 100 mg via ORAL
  Filled 2022-01-16 (×2): qty 2

## 2022-01-16 MED ORDER — ACETAMINOPHEN 160 MG/5ML PO SOLN: 650.0000 mg | ORAL | Status: DC | PRN

## 2022-01-16 MED ORDER — LORAZEPAM 2 MG/ML IJ SOLN
2.0000 mg | Freq: Once | INTRAMUSCULAR | Status: AC
  Administered 2022-01-16: 2 mg via INTRAVENOUS
  Filled 2022-01-16: qty 1

## 2022-01-16 MED ORDER — LORAZEPAM 2 MG/ML IJ SOLN
INTRAMUSCULAR | Status: AC
  Administered 2022-01-16: 2 mg via INTRAVENOUS
  Filled 2022-01-16: qty 1

## 2022-01-16 MED ORDER — SODIUM CHLORIDE 0.9% FLUSH
3.0000 mL | Freq: Once | INTRAVENOUS | Status: AC
  Administered 2022-01-16: 3 mL via INTRAVENOUS

## 2022-01-16 MED ORDER — PANTOPRAZOLE SODIUM 40 MG IV SOLR: 40.0000 mg | Freq: Every day | INTRAVENOUS | Status: DC

## 2022-01-16 MED ORDER — PERFLUTREN LIPID MICROSPHERE
1.0000 mL | INTRAVENOUS | Status: AC | PRN
  Administered 2022-01-16: 2 mL via INTRAVENOUS

## 2022-01-16 MED ORDER — CHLORHEXIDINE GLUCONATE CLOTH 2 % EX PADS: 6.0000 | MEDICATED_PAD | Freq: Every day | CUTANEOUS | Status: DC

## 2022-01-16 MED ORDER — LEVETIRACETAM IN NACL 1500 MG/100ML IV SOLN
1500.0000 mg | Freq: Once | INTRAVENOUS | Status: AC
  Administered 2022-01-16: 1500 mg via INTRAVENOUS
  Filled 2022-01-16: qty 100

## 2022-01-16 MED ORDER — ACETAMINOPHEN 325 MG PO TABS: 650.0000 mg | ORAL_TABLET | ORAL | Status: DC | PRN

## 2022-01-16 MED ORDER — LIDOCAINE 5 % EX PTCH: 1.0000 | MEDICATED_PATCH | Freq: Every day | CUTANEOUS | Status: DC | PRN

## 2022-01-16 MED ORDER — TENECTEPLASE FOR STROKE
0.2500 mg/kg | PACK | Freq: Once | INTRAVENOUS | Status: AC
  Administered 2022-01-16: 25 mg via INTRAVENOUS
  Filled 2022-01-16: qty 10

## 2022-01-16 MED ORDER — SODIUM CHLORIDE 0.9 % IV SOLN: INTRAVENOUS | Status: DC

## 2022-01-16 MED ORDER — LEVETIRACETAM IN NACL 500 MG/100ML IV SOLN
500.0000 mg | Freq: Once | INTRAVENOUS | Status: AC
  Administered 2022-01-16: 500 mg via INTRAVENOUS
  Filled 2022-01-16: qty 100

## 2022-01-16 MED ORDER — ORAL CARE MOUTH RINSE: 15.0000 mL | OROMUCOSAL | Status: DC | PRN

## 2022-01-16 MED ORDER — STROKE: EARLY STAGES OF RECOVERY BOOK
Freq: Once | Status: AC
  Filled 2022-01-16: qty 1

## 2022-01-16 MED ORDER — ALLOPURINOL 100 MG PO TABS
100.0000 mg | ORAL_TABLET | Freq: Every day | ORAL | Status: DC
  Administered 2022-01-16 – 2022-01-18 (×3): 100 mg via ORAL
  Filled 2022-01-16 (×3): qty 1

## 2022-01-16 MED ORDER — ACETAMINOPHEN 650 MG RE SUPP: 650.0000 mg | RECTAL | Status: DC | PRN

## 2022-01-16 MED ORDER — PANTOPRAZOLE SODIUM 40 MG PO TBEC
40.0000 mg | DELAYED_RELEASE_TABLET | Freq: Every day | ORAL | Status: DC
  Administered 2022-01-16 – 2022-01-17 (×2): 40 mg via ORAL
  Filled 2022-01-16 (×2): qty 1

## 2022-01-16 NOTE — ED Triage Notes (Signed)
PT arrives via EMS from home. Pt activated as a code stroke pta. Pt woke up around 0600 and 0900 this morning to use the bathroom, at those times the patient's wife reported to ems that he was his normal self. Pt woke up again around 1030 and had a right sided facial droop and slurred speech. PT arrives to ED AxOx4. Neurologist at bedside.

## 2022-01-16 NOTE — Plan of Care (Signed)
  Problem: Education: Goal: Knowledge of General Education information will improve Description: Including pain rating scale, medication(s)/side effects and non-pharmacologic comfort measures Outcome: Progressing   Problem: Nutrition: Goal: Adequate nutrition will be maintained Outcome: Progressing   Problem: Coping: Goal: Level of anxiety will decrease Outcome: Progressing   Problem: Elimination: Goal: Will not experience complications related to bowel motility Outcome: Progressing Goal: Will not experience complications related to urinary retention Outcome: Progressing   Problem: Pain Managment: Goal: General experience of comfort will improve Outcome: Progressing

## 2022-01-16 NOTE — ED Notes (Signed)
TNK given  

## 2022-01-16 NOTE — Progress Notes (Signed)
PHARMACIST CODE STROKE RESPONSE  Notified to mix TNK at 11:45 by Dr. Lorrin Goodell TNK preparation completed at 11:48  TNK dose = 25 mg IV over 5 seconds.   Issues/delays encountered (if applicable): Mixed in CT and pt was transferred to ED28. Decided to re-cycle blood pressure prior to administration. BP was 173/85 and TNK was given at 11:51.   Luisa Hart, PharmD, BCPS Clinical Pharmacist 01/16/2022 11:53 AM

## 2022-01-16 NOTE — Progress Notes (Cosign Needed Addendum)
EEG complete - results pending 
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
## 2022-01-16 NOTE — Progress Notes (Signed)
*   Echocardiogram 2D Echocardiogram with contrast has been performed.  Merrie Roof F 01/16/2022, 2:40 PM

## 2022-01-16 NOTE — Progress Notes (Signed)
In regards to pt current Mri's only able to obtain Mri brain and Mra head.  Unable to obtain  Mra neck imaging as pt motion cause imaging to become nondiagnostic.  Best possible images obtained.

## 2022-01-16 NOTE — ED Provider Notes (Signed)
Code stroke screened at the bridge.  Airway appears intact and stable for CT scan.   Davonna Belling, MD 01/16/22 1124

## 2022-01-16 NOTE — Progress Notes (Signed)
Pt unavailable for his EEG at this time. Pt is currently with Echo then going to 4N. Will attempt later today when schedule permits

## 2022-01-16 NOTE — ED Provider Notes (Signed)
[facility] EMERGENCY DEPARTMENT Provider Note   CSN: [id number] Arrival date & time: 01/16/22  1120     History  Chief Complaint  Patient presents with   Code Stroke    Luis Fisher is a 66 y.o. male.  HPI   Patient arrived to the emergency room this morning for evaluation of an acute stroke.  Patient was able to get up initially this morning and he was fine.  It was sometime between 6 and 9:00.  Later in the morning at about 1030 the patient tried to get out of bed and noted he was having weakness on his right side.  He had a lot of difficulty even rolling over.  EMS was contacted.  They have also noted some facial droop as well as some slurring in his speech.  Patient during EMS transport subsequently started to have some rhythmic movements of his right upper extremity.  Patient denies any other issues of fevers or chills.  No vomiting or diarrhea.  Home Medications Prior to Admission medications   Medication Sig Start Date End Date Taking? Authorizing Provider  allopurinol (ZYLOPRIM) 100 MG tablet Take 1 tablet (100 mg total) by mouth daily. 05/22/21   Koberlein, Steele Berg, MD  amLODipine (NORVASC) 10 MG tablet Take 1 tablet (10 mg total) by mouth daily. 05/22/21   Caren Macadam, MD  aspirin 325 MG tablet Take 325 mg by mouth daily. Reports taking aspirin 2-3 times per week    [provider]  clobetasol cream (TEMOVATE) 0.05 % Apply topically 2 (two) times daily as needed. 10/17/17   Marletta Lor, MD  fluticasone [facility]) 50 MCG/ACT nasal spray USE 1-2 SPRAYS IN EACH NOSTRIL DAILY AS NEEDED. 10/17/17   Marletta Lor, MD  HYDROcodone-acetaminophen (NORCO) 5-325 MG tablet Take 1 tablet by mouth 2 (two) times daily as needed for moderate pain or severe pain. 12/10/21   Farrel Conners, MD  lidocaine (LIDODERM) 5 % APPLY 1 PATCH TO THE SKIN DAILY. REMOVE & DISCARD PATCH WITHIN 12 HOURS OR AS DIRECTED BY MD. 10/17/17   Marletta Lor, MD   meloxicam (MOBIC) 7.5 MG tablet Take 1 tablet (7.5 mg total) by mouth daily as needed for pain. 12/10/21   Farrel Conners, MD  metoprolol tartrate (LOPRESSOR) 100 MG tablet Take 1 tablet (100 mg total) by mouth 2 (two) times daily. 12/10/21   Farrel Conners, MD  predniSONE (DELTASONE) 20 MG tablet TAKE ONE TABLET BY MOUTH TWICE DAILY AS NEEDED FOR ARTHRITIS FLARE 05/22/21   Caren Macadam, MD  traMADol (ULTRAM) 50 MG tablet TAKE ONE OR TWO TABLETS BY MOUTH EVERY SIX HOURS AS NEEDED FOR MODERATE PAIN 12/10/21   Farrel Conners, MD  furosemide (LASIX) 40 MG tablet Take 1 tablet (40 mg total) by mouth daily as needed. 10/17/17 12/10/18  Marletta Lor, MD      Allergies    Atorvastatin and Contrast media [iodinated contrast media]    Review of Systems   Review of Systems  Physical Exam Updated Vital Signs BP 138/85   Pulse 99   Temp 99.2 F (37.3 C) (Oral)   Resp (!) 27   Ht 1.727 m ('5\' 8"'$ )   Wt 101.8 kg   SpO2 99%   BMI 34.12 kg/m  Physical Exam Vitals and nursing note reviewed.  Constitutional:      General: He is not in acute distress.    Appearance: He is well-developed.  HENT:  Head: Normocephalic and atraumatic.     Right Ear: External ear normal.     Left Ear: External ear normal.  Eyes:     General: No scleral icterus.       Right eye: No discharge.        Left eye: No discharge.     Conjunctiva/sclera: Conjunctivae normal.  Neck:     Trachea: No tracheal deviation.  Cardiovascular:     Rate and Rhythm: Normal rate and regular rhythm.  Pulmonary:     Effort: Pulmonary effort is normal. No respiratory distress.     Breath sounds: Normal breath sounds. No stridor. No wheezing or rales.  Abdominal:     General: Bowel sounds are normal. There is no distension.     Palpations: Abdomen is soft.     Tenderness: There is no abdominal tenderness. There is no guarding or rebound.  Musculoskeletal:        General: No tenderness or deformity.      Cervical back: Neck supple.  Skin:    General: Skin is warm and dry.     Findings: No rash.  Neurological:     General: No focal deficit present.     Mental Status: He is alert.     Cranial Nerves: Cranial nerve deficit (Right facial droop, extraocular movements intact, no slurred speech) present.     Sensory: No sensory deficit.     Motor: No abnormal muscle tone.     Coordination: Coordination normal.     Comments: Intermittent neck movements of his right lower extremity, patient able to move right upper extremity right lower extremity  Psychiatric:        Mood and Affect: Mood normal.     ED Results / Procedures / Treatments   Labs (all labs ordered are listed, but only abnormal results are displayed) Labs Reviewed  COMPREHENSIVE METABOLIC PANEL - Abnormal; Notable for the following components:      Result Value   Glucose, Bld 110 (*)    Alkaline Phosphatase 19 (*)    Total Bilirubin 1.4 (*)    All other components within normal limits  I-STAT CHEM 8, ED - Abnormal; Notable for the following components:   Glucose, Bld 103 (*)    Calcium, Ion 1.00 (*)    All other components within normal limits  CBG MONITORING, ED - Abnormal; Notable for the following components:   Glucose-Capillary 102 (*)    All other components within normal limits  PROTIME-INR  APTT  CBC  DIFFERENTIAL  ETHANOL  HIV ANTIBODY (ROUTINE TESTING W REFLEX)  COPPER, SERUM  CERULOPLASMIN  HEAVY METALS, BLOOD  ANTISTREPTOLYSIN O TITER  VITAMIN B12  VITAMIN B1  FOLATE  ENDOMYSIAL IGA ANTIBODY  GLIADIN ANTIBODIES, SERUM  ANTINUCLEAR ANTIBODIES, IFA  RAPID URINE DRUG SCREEN, HOSP PERFORMED  ANTI-DNA ANTIBODY, DOUBLE-STRANDED  HEMOGLOBIN A1C  LIPID PANEL    EKG EKG Interpretation  Date/Time:  Saturday January 16 2022 12:22:32 EDT Ventricular Rate:  95 PR Interval:  180 QRS Duration: 99 QT Interval:  354 QTC Calculation: 445 R Axis:   100 Text Interpretation: Sinus rhythm Right axis  deviation Nonspecific T abnormalities, anterior leads No significant change since last tracing Confirmed by Dorie Rank [phone]) on 01/16/2022 12:44:01 PM  Radiology CT HEAD CODE STROKE WO CONTRAST  Result Date: 01/16/2022 CLINICAL DATA:  Code stroke. Neuro deficit, acute, stroke suspected. EXAM: CT HEAD WITHOUT CONTRAST TECHNIQUE: Contiguous axial images were obtained from the base of the skull through the vertex  without intravenous contrast. RADIATION DOSE REDUCTION: This exam was performed according to the departmental dose-optimization program which includes automated exposure control, adjustment of the mA and/or kV according to patient size and/or use of iterative reconstruction technique. COMPARISON:  No pertinent prior exams available for comparison. FINDINGS: Motion degraded examination. Brain: Mild generalized cerebral atrophy. Minimal patchy and ill-defined hypoattenuation within the cerebral white matter, nonspecific but compatible with chronic small vessel ischemic disease. There is no acute intracranial hemorrhage. No demarcated cortical infarct. No extra-axial fluid collection. No evidence of an intracranial mass. No midline shift. Vascular: No hyperdense vessel.  Atherosclerotic calcifications. Skull: No fracture or aggressive osseous lesion. Sinuses/Orbits: No mass or acute finding within the imaged orbits. No significant paranasal sinus disease at the imaged levels. ASPECTS ([place] Stroke Program Early CT Score) - Ganglionic level infarction (caudate, lentiform nuclei, internal capsule, insula, M1-M3 cortex): 7 - Supraganglionic infarction (M4-M6 cortex): 3 Total score (0-10 with 10 being normal): 10 These results were communicated to Dr. Lorrin Goodell At 11:42 amon 9/30/2023by text page via the [facility] messaging system. IMPRESSION: No evidence of acute intracranial abnormality. Minimal chronic small-vessel ischemic changes within the cerebral white matter. Mild generalized cerebral atrophy.  Electronically Signed   By: Kellie Simmering D.O.   On: 01/16/2022 11:45    Procedures Procedures    Medications Ordered in ED Medications   stroke: early stages of recovery book (has no administration in time range)  0.9 %  sodium chloride infusion (has no administration in time range)  acetaminophen (TYLENOL) tablet 650 mg (has no administration in time range)    Or  acetaminophen (TYLENOL) 160 MG/5ML solution 650 mg (has no administration in time range)    Or  acetaminophen (TYLENOL) suppository 650 mg (has no administration in time range)  senna-docusate (Senokot-S) tablet 1 tablet (has no administration in time range)  pantoprazole (PROTONIX) injection 40 mg (has no administration in time range)  labetalol (NORMODYNE) injection 10 mg (has no administration in time range)  LORazepam (ATIVAN) injection 2 mg (has no administration in time range)  sodium chloride flush (NS) 0.9 % injection 3 mL (3 mLs Intravenous Given 01/16/22 1152)  LORazepam (ATIVAN) injection 2 mg (2 mg Intravenous Given 01/16/22 1138)  levETIRAcetam (KEPPRA) IVPB 1500 mg/ 100 mL premix (0 mg Intravenous Stopped 01/16/22 1224)    Followed by  levETIRAcetam (KEPPRA) IVPB 500 mg/100 mL premix (0 mg Intravenous Stopped 01/16/22 1240)  tenecteplase (TNKASE) injection for Stroke 25 mg (25 mg Intravenous Given 01/16/22 1151)    ED Course/ Medical Decision Making/ A&P Clinical Course as of 01/16/22 1244  Sat Jan 16, 2022  1239 I-stat chem 8, ED(!) No significant abnormalities [JK]  1239 Comprehensive metabolic panel(!) Bilirubin slightly elevated [JK]  1239 CBC Normal [JK]    Clinical Course User Index [JK] Dorie Rank, MD                           Medical Decision Making Problems Addressed: Acute stroke due to ischemia [facility]): acute illness or injury that poses a threat to life or bodily functions  Amount and/or Complexity of Data Reviewed Labs: ordered. Radiology: ordered and independent interpretation  performed.  Risk Decision regarding hospitalization.   Pt presented with acute onset weakness.  Presentation concerning for acute stroke.  Patient was seen by stroke team Dr. Collecting on arrival.  Patient was in the tPA window.  He was treated with tPA for his acute stroke.  On my repeat  evaluation after the tPA has been started patient is already starting to notice some improvement.  Patient will be admitted to the neuro ICU for further close monitoring and treatment.        Final Clinical Impression(s) / ED Diagnoses Final diagnoses:  Acute stroke due to ischemia [facility])    Rx / DC Orders ED Discharge Orders     None         Dorie Rank, MD 01/16/22 1244

## 2022-01-17 ENCOUNTER — Encounter (HOSPITAL_COMMUNITY): Payer: Medicare Other

## 2022-01-17 ENCOUNTER — Inpatient Hospital Stay (HOSPITAL_COMMUNITY): Payer: Medicare Other

## 2022-01-17 DIAGNOSIS — F10129 Alcohol abuse with intoxication, unspecified: Secondary | ICD-10-CM | POA: Diagnosis not present

## 2022-01-17 DIAGNOSIS — G459 Transient cerebral ischemic attack, unspecified: Secondary | ICD-10-CM | POA: Diagnosis not present

## 2022-01-17 DIAGNOSIS — Z9282 Status post administration of tPA (rtPA) in a different facility within the last 24 hours prior to admission to current facility: Secondary | ICD-10-CM | POA: Diagnosis not present

## 2022-01-17 DIAGNOSIS — Z8673 Personal history of transient ischemic attack (TIA), and cerebral infarction without residual deficits: Secondary | ICD-10-CM | POA: Diagnosis not present

## 2022-01-17 DIAGNOSIS — R4701 Aphasia: Secondary | ICD-10-CM

## 2022-01-17 DIAGNOSIS — I6523 Occlusion and stenosis of bilateral carotid arteries: Secondary | ICD-10-CM

## 2022-01-17 LAB — RAPID URINE DRUG SCREEN, HOSP PERFORMED
Amphetamines: NOT DETECTED
Barbiturates: NOT DETECTED
Benzodiazepines: POSITIVE — AB
Cocaine: NOT DETECTED
Opiates: POSITIVE — AB
Tetrahydrocannabinol: POSITIVE — AB

## 2022-01-17 MED ORDER — ADULT MULTIVITAMIN W/MINERALS CH
1.0000 | ORAL_TABLET | Freq: Every day | ORAL | Status: DC
  Administered 2022-01-17 – 2022-01-18 (×2): 1 via ORAL
  Filled 2022-01-17 (×2): qty 1

## 2022-01-17 MED ORDER — ASPIRIN 325 MG PO TABS
325.0000 mg | ORAL_TABLET | ORAL | Status: DC
  Filled 2022-01-17: qty 1

## 2022-01-17 MED ORDER — ROSUVASTATIN CALCIUM 20 MG PO TABS
20.0000 mg | ORAL_TABLET | Freq: Every day | ORAL | Status: DC
  Administered 2022-01-18: 20 mg via ORAL
  Filled 2022-01-17 (×2): qty 1

## 2022-01-17 MED ORDER — THIAMINE MONONITRATE 100 MG PO TABS
100.0000 mg | ORAL_TABLET | Freq: Every day | ORAL | Status: DC
  Administered 2022-01-17 – 2022-01-18 (×2): 100 mg via ORAL
  Filled 2022-01-17 (×2): qty 1

## 2022-01-17 MED ORDER — LORAZEPAM 2 MG/ML IJ SOLN: 1.0000 mg | INTRAMUSCULAR | Status: DC | PRN

## 2022-01-17 MED ORDER — LORAZEPAM 2 MG/ML IJ SOLN
2.0000 mg | Freq: Once | INTRAMUSCULAR | Status: AC
  Administered 2022-01-17: 2 mg via INTRAVENOUS
  Filled 2022-01-17 (×2): qty 1

## 2022-01-17 MED ORDER — FOLIC ACID 1 MG PO TABS
1.0000 mg | ORAL_TABLET | Freq: Every day | ORAL | Status: DC
  Administered 2022-01-17 – 2022-01-18 (×2): 1 mg via ORAL
  Filled 2022-01-17 (×2): qty 1

## 2022-01-17 MED ORDER — THIAMINE HCL 100 MG/ML IJ SOLN: 100.0000 mg | Freq: Every day | INTRAMUSCULAR | Status: DC

## 2022-01-17 MED ORDER — GADOPICLENOL 0.5 MMOL/ML IV SOLN
7.5000 mL | Freq: Once | INTRAVENOUS | Status: AC | PRN
  Administered 2022-01-17: 7.5 mL via INTRAVENOUS

## 2022-01-17 MED ORDER — LORAZEPAM 1 MG PO TABS: 0.0000 mg | ORAL_TABLET | ORAL | Status: DC

## 2022-01-17 MED ORDER — LORAZEPAM 1 MG PO TABS: 1.0000 mg | ORAL_TABLET | ORAL | Status: DC | PRN

## 2022-01-17 MED ORDER — LORAZEPAM 1 MG PO TABS: 0.0000 mg | ORAL_TABLET | Freq: Three times a day (TID) | ORAL | Status: DC

## 2022-01-17 NOTE — Progress Notes (Addendum)
STROKE TEAM PROGRESS NOTE   INTERVAL HISTORY Patient is seen in his room with his wife at the bedside.  Yesterday, he experienced acute onset slurred speech, right facial droop and right sided choreiform movements.  Symptoms were felt to be due to a stroke, and TNK was administered.  Patient's symptoms did improve after this.  MRI was negative for stroke but very motion degraded.  Will try MRI with and without contrast again with anxiolysis.  Vitals:   01/17/22 0900 01/17/22 1000 01/17/22 1100 01/17/22 1200  BP: (!) 158/93 (!) 169/92 (!) 143/82 (!) 140/80  Pulse: 95 91 89 95  Resp: '16 20 14 14  '$ Temp:    98.3 F (36.8 C)  TempSrc:    Oral  SpO2: 97% 100% 97% 95%  Weight:      Height:       CBC:  Recent Labs  Lab 01/16/22 1129  WBC 6.7  NEUTROABS 3.4  HGB 16.0  16.5  HCT 47.0  46.1  MCV 92.6  PLT 454   Basic Metabolic Panel:  Recent Labs  Lab 01/16/22 1129  NA 140  140  K 4.3  4.0  CL 105  107  CO2 22  GLUCOSE 110*  103*  BUN 11  12  CREATININE 1.07  0.90  CALCIUM 9.5   Lipid Panel:  Recent Labs  Lab 01/16/22 1244  CHOL 212*  TRIG 191*  HDL 46  CHOLHDL 4.6  VLDL 38  LDLCALC 128*   HgbA1c:  Recent Labs  Lab 01/16/22 1243  HGBA1C 5.0   Urine Drug Screen: No results for input(s): "LABOPIA", "COCAINSCRNUR", "LABBENZ", "AMPHETMU", "THCU", "LABBARB" in the last 168 hours.  Alcohol Level  Recent Labs  Lab 01/16/22 1129  ETH <10    IMAGING past 24 hours VAS US CAROTID  Result Date: 01/17/2022 Carotid Arterial Duplex Study Patient Name:  Luis Fisher  Date of Exam:   01/17/2022 Medical Rec #: [id number]    Accession #:    [id number] Date of Birth: 68/01/1956    Patient Gender: M Patient Age:   66 years Exam Location:  [facility] Procedure:      VAS US CAROTID Referring Phys: Alferd Patee [facility] --------------------------------------------------------------------------------  Indications:  CVA. Risk Factors: Hypertension, prior CVA. Performing  Technologist: Archie Patten RVS  Examination Guidelines: A complete evaluation includes B-mode imaging, spectral Doppler, color Doppler, and power Doppler as needed of all accessible portions of each vessel. Bilateral testing is considered an integral part of a complete examination. Limited examinations for reoccurring indications may be performed as noted.  Right Carotid Findings: +----------+--------+--------+--------+------------------+--------+           PSV cm/sEDV cm/sStenosisPlaque DescriptionComments +----------+--------+--------+--------+------------------+--------+ CCA Prox  89      12              heterogenous               +----------+--------+--------+--------+------------------+--------+ CCA Distal71      12              heterogenous               +----------+--------+--------+--------+------------------+--------+ ICA Prox  94      17      1-39%   heterogenous               +----------+--------+--------+--------+------------------+--------+ ICA Distal53      13                                         +----------+--------+--------+--------+------------------+--------+  ECA       118                                                +----------+--------+--------+--------+------------------+--------+ +----------+--------+-------+--------+-------------------+           PSV cm/sEDV cmsDescribeArm Pressure (mmHG) +----------+--------+-------+--------+-------------------+ Subclavian100                                        +----------+--------+-------+--------+-------------------+ +---------+--------+--+--------+--+---------+ VertebralPSV cm/s43EDV cm/s12Antegrade +---------+--------+--+--------+--+---------+  Left Carotid Findings: +----------+--------+--------+--------+------------------+--------+           PSV cm/sEDV cm/sStenosisPlaque DescriptionComments +----------+--------+--------+--------+------------------+--------+ CCA Prox  75       16              heterogenous               +----------+--------+--------+--------+------------------+--------+ CCA Distal89      19              heterogenous               +----------+--------+--------+--------+------------------+--------+ ICA Prox  53      18      1-39%   heterogenous               +----------+--------+--------+--------+------------------+--------+ ICA Distal72      23                                         +----------+--------+--------+--------+------------------+--------+ ECA       86                                                 +----------+--------+--------+--------+------------------+--------+ +----------+--------+--------+--------+-------------------+           PSV cm/sEDV cm/sDescribeArm Pressure (mmHG) +----------+--------+--------+--------+-------------------+ [id number]                                         +----------+--------+--------+--------+-------------------+ +---------+--------+--+--------+-+---------+ VertebralPSV cm/s41EDV cm/s8Antegrade +---------+--------+--+--------+-+---------+   Summary: Right Carotid: Velocities in the right ICA are consistent with a 1-39% stenosis. Left Carotid: Velocities in the left ICA are consistent with a 1-39% stenosis. Vertebrals: Bilateral vertebral arteries demonstrate antegrade flow. *See table(s) above for measurements and observations.     Preliminary    MR ANGIO HEAD WO CONTRAST  Result Date: 01/16/2022 CLINICAL DATA:  Neuro deficit, acute, stroke suspected. EXAM: MRA HEAD WITHOUT CONTRAST TECHNIQUE: Angiographic images of the Circle of Willis were acquired using MRA technique without intravenous contrast. COMPARISON:  Same-day brain MRI 01/16/2022. Noncontrast head CT 01/16/2022. FINDINGS: Moderately motion degraded examination. Anterior circulation: The intracranial internal carotid arteries are patent. The M1 middle cerebral arteries are patent. No M2 proximal branch  occlusion or high-grade proximal stenosis. The anterior cerebral arteries are patent. No intracranial aneurysm is identified. Posterior circulation: The intracranial vertebral arteries are patent. The basilar artery is patent. The posterior cerebral arteries are patent. Posterior communicating arteries are diminutive or absent, bilaterally. Anatomic variants: As described. IMPRESSION: Moderately motion degraded examination. No intracranial large  vessel occlusion. Within the limitations of motion degradation, no severe proximal arterial stenosis is identified. Electronically Signed   By: Kellie Simmering D.O.   On: 01/16/2022 17:53   MR BRAIN WO CONTRAST  Result Date: 01/16/2022 CLINICAL DATA:  Neuro deficit, acute, stroke suspected. EXAM: MRI HEAD WITHOUT CONTRAST TECHNIQUE: Multiplanar, multiecho pulse sequences of the brain and surrounding structures were obtained without intravenous contrast. COMPARISON:  Noncontrast head CT performed earlier today. FINDINGS: Intermittently motion degraded examination. Most notably, there is moderate motion degradation of the axial diffusion-weighted sequence, moderate to severe motion degradation of the axial T2 FLAIR sequence, moderate motion degradation of the axial T2 sequence, moderate to severe motion degradation of the axial T1 sequence, moderate motion degradation of the axial SWI sequence and moderate to severe motion degradation of the coronal T2 weighted sequence. Brain: Mild generalized parenchymal atrophy. Mild multifocal T2 FLAIR hyperintense signal abnormality within the cerebral white matter, nonspecific but compatible with chronic small vessel disease. Within the limitations of motion degradation, no acute infarct is identified. No intracranial mass, chronic intracranial blood products or extra-axial fluid collection appreciate. No midline shift. Vascular: Maintained flow voids within the proximal large arterial vessels. Skull and upper cervical spine: No  suspicious marrow lesion. Incompletely assessed cervical spondylosis. Sinuses/Orbits: No mass or acute finding within the imaged orbits. No significant paranasal sinus disease. Other: 17 mm predominantly T2 hyperintense lesion within the left parotid gland. IMPRESSION: Significantly motion degraded examination, as described. Within this limitation, there is no evidence of acute intracranial abnormality. Mild chronic small vessel ischemic changes within the cerebral white matter. Mild generalized parenchymal atrophy. 17 mm predominantly T2 hyperintense lesion within the left parotid gland. This may reflect a primary parotid neoplasm or abnormal lymph node. ENT referral recommended. Electronically Signed   By: Kellie Simmering D.O.   On: 01/16/2022 17:45   ECHOCARDIOGRAM COMPLETE  Result Date: 01/16/2022    ECHOCARDIOGRAM REPORT   Patient Name:   Luis Fisher Date of Exam: 01/16/2022 Medical Rec #:  [id number]   Height:       68.0 in Accession #:    [id number]  Weight:       224.4 lb Date of Birth:  07-24-1955   BSA:          2.146 m Patient Age:    74 years    BP:           143/118 mmHg Patient Gender: M           HR:           95 bpm. Exam Location:  Inpatient Procedure: 2D Echo, Cardiac Doppler, Color Doppler and Intracardiac            Opacification Agent Indications:    Stroke  History:        Patient has no prior history of Echocardiogram examinations.  Sonographer:    Merrie Roof RDCS Referring Phys: [id number] [place]  1. Left ventricular ejection fraction, by estimation, is 60 to 65%. The left ventricle has normal function. The left ventricle has no regional wall motion abnormalities. There is mild concentric left ventricular hypertrophy. Left ventricular diastolic parameters are consistent with Grade I diastolic dysfunction (impaired relaxation).  2. Right ventricular systolic function is normal. The right ventricular size is normal. Tricuspid regurgitation signal is inadequate for assessing  PA pressure.  3. The mitral valve is grossly normal. Trivial mitral valve regurgitation.  4. The aortic valve is tricuspid. There is mild calcification of the aortic  valve. Aortic valve regurgitation is not visualized. Aortic valve sclerosis/calcification is present, without any evidence of aortic stenosis. Aortic valve mean gradient measures 5.0 mmHg.  5. The inferior vena cava is normal in size with greater than 50% respiratory variability, suggesting right atrial pressure of 3 mmHg. Comparison(s): No prior Echocardiogram. FINDINGS  Left Ventricle: Left ventricular ejection fraction, by estimation, is 60 to 65%. The left ventricle has normal function. The left ventricle has no regional wall motion abnormalities. The left ventricular internal cavity size was normal in size. There is  mild concentric left ventricular hypertrophy. Left ventricular diastolic parameters are consistent with Grade I diastolic dysfunction (impaired relaxation). Right Ventricle: The right ventricular size is normal. No increase in right ventricular wall thickness. Right ventricular systolic function is normal. Tricuspid regurgitation signal is inadequate for assessing PA pressure. Left Atrium: Left atrial size was normal in size. Right Atrium: Right atrial size was normal in size. Pericardium: There is no evidence of pericardial effusion. Presence of epicardial fat layer. Mitral Valve: The mitral valve is grossly normal. Trivial mitral valve regurgitation. Tricuspid Valve: The tricuspid valve is grossly normal. Tricuspid valve regurgitation is trivial. Aortic Valve: The aortic valve is tricuspid. There is mild calcification of the aortic valve. There is mild to moderate aortic valve annular calcification. Aortic valve regurgitation is not visualized. Aortic valve sclerosis/calcification is present, without any evidence of aortic stenosis. Aortic valve mean gradient measures 5.0 mmHg. Aortic valve peak gradient measures 10.1 mmHg. Aortic  valve area, by VTI measures 2.47 cm. Pulmonic Valve: The pulmonic valve was grossly normal. Pulmonic valve regurgitation is trivial. Aorta: The aortic root is normal in size and structure. Venous: The inferior vena cava is normal in size with greater than 50% respiratory variability, suggesting right atrial pressure of 3 mmHg. IAS/Shunts: No atrial level shunt detected by color flow Doppler.  LEFT VENTRICLE PLAX 2D LVIDd:         5.10 cm   Diastology LVIDs:         3.60 cm   LV e' medial:    4.90 cm/s LV PW:         1.20 cm   LV E/e' medial:  11.0 LV IVS:        1.30 cm   LV e' lateral:   5.87 cm/s LVOT diam:     2.00 cm   LV E/e' lateral: 9.2 LV SV:         64 LV SV Index:   30 LVOT Area:     3.14 cm  RIGHT VENTRICLE RV Basal diam:  3.20 cm RV S prime:     11.00 cm/s TAPSE (M-mode): 1.8 cm LEFT ATRIUM           Index        RIGHT ATRIUM           Index LA diam:      3.70 cm 1.72 cm/m   RA Area:     16.00 cm LA Vol (A2C): 64.7 ml 30.14 ml/m  RA Volume:   33.40 ml  15.56 ml/m LA Vol (A4C): 59.7 ml 27.81 ml/m  AORTIC VALVE AV Area (Vmax):    2.43 cm AV Area (Vmean):   2.35 cm AV Area (VTI):     2.47 cm AV Vmax:           159.00 cm/s AV Vmean:          102.000 cm/s AV VTI:  0.258 m AV Peak Grad:      10.1 mmHg AV Mean Grad:      5.0 mmHg LVOT Vmax:         123.00 cm/s LVOT Vmean:        76.400 cm/s LVOT VTI:          0.203 m LVOT/AV VTI ratio: 0.79  AORTA Ao Asc diam: 3.50 cm MITRAL VALVE MV Area (PHT): 4.06 cm    SHUNTS MV Decel Time: 187 msec    Systemic VTI:  0.20 m MV E velocity: 53.80 cm/s  Systemic Diam: 2.00 cm MV A velocity: 92.70 cm/s MV E/A ratio:  0.58 Rozann Lesches MD Electronically signed by Rozann Lesches MD Signature Date/Time: 01/16/2022/2:46:50 PM    Final     PHYSICAL EXAM General:  Alert, well-nourished, well-developed patient in no acute distress Respiratory:  Regular, unlabored respirations on supplemental O2  NEURO:  Mental Status: AA&Ox3  Speech/Language: speech is  without dysarthria or aphasia.  Fluency, and comprehension intact.  Cranial Nerves:  II: PERRL. Visual fields full.  III, IV, VI: EOMI. Eyelids elevate symmetrically.  V: Sensation is intact to light touch with baseline numbness to left forehead  VII: Smile is symmetrical.   VIII: hearing intact to voice. IX, X:Phonation is normal.  VV:OHYWVPXT shrug 5/5. XII: tongue is midline without fasciculations. Motor: 5/5 strength to all muscle groups tested.  Tone: is normal and bulk is normal Sensation- Intact to light touch bilaterally.  Coordination: FTN intact bilaterally, HKS: no ataxia in BLE.No drift. Mild postural tremor in hands Gait- deferred     ASSESSMENT/PLAN Mr. Luis Fisher is a 66 y.o. male with history of questionable amaurosis fugax vs. TIA, DISH, HTN, asthma, prostate cancer, hypothyroidism and ETOH abuse presenting with acute onset slurred speech, right facial droop and right sided choreiform movements.  Symptoms were felt to be due to a stroke, and TNK was administered.  Patient's symptoms did improve after this.  MRI was negative for stroke but very motion degraded.  Will try MRI with and without contrast again with anxiolysis.  Stroke-like episode with hemiballism s/p TNK Possible TIA Code Stroke CT head No acute abnormality. ASPECTS 10.    MRI  Motion degraded exam, no acute intracranial abnormality.  MRA  Motion degraded, no intracranial LVO Carotid Doppler unremarkable 2D Echo EF 60-65% EEG normal Consider 30-day cardiac event monitoring as outpatient to rule out A-fib given history of likely TIA.  If negative, may consider loop recorder later LDL 128 HgbA1c 5.0 UDS positive for THC VTE prophylaxis - SCDs aspirin 325 mg daily prior to admission, now on home aspirin 24 hours per TNK.  Consider DAPT on discharge Therapy recommendations: Home health Disposition:  pending  History of possible TIA Patient self reported heavy load of hemianopia 10 years ago Follow  up with ophthalmology at the time concerning for amaurosis fugax Recommend long term cardiac monitoring to rule out A-fib  Hypertension Home meds:  amlodipine 10 mg daily Stable Keep BP <180/105 Long-term BP goal normotensive  Hyperlipidemia Home meds:  none LDL 128, goal < 70 Add rosuvastatin 20 mg daily  Continue statin at discharge  Heavy ETOH use Patient drinks about four drinks daily No history of withdrawal CIWA protocol Multivitamin/folic acid/B1  Other Stroke Risk Factors Advanced Age >/= 37  Former Cigarette smoker Obesity, Body mass index is 34.12 kg/m., BMI >/= 30 associated with increased stroke risk, recommend weight loss, diet and exercise as appropriate   Other Active Problems  17 mm Lesion in left parotid gland on MRI Recommend outpatient ENT follow up Prostate cancer on remission  Hospital day # [place] , MSN, AGACNP-BC [facility] See Amion for schedule and pager information 01/17/2022 1:27 PM  ATTENDING NOTE: I reviewed above note and agree with the assessment and plan. Pt was seen and examined.   66 year old male with history of TIA, DISH, hypertension, prostate cancer, alcohol abuse admitted for acute onset slurred speech, right facial droop, right upper extremity hemiballism.  CT no acute abnormality.  Status post TNK.  MRI brain and MRA head unremarkable.  Repeat MRI at the 24 hours again negative for stroke.  EF 60 to 65%.  Carotid Doppler negative.  EEG normal.  LDL 128, A1c 5.0.  UDS positive for THC.  Creatinine 1.07.  On exam, patient neurologically intact, no focal deficits.  No tremor or myoclonus, no hemiballismus on exam.  Etiology for patient symptoms concerning for TIA, strokelike symptoms reported by TNK.  Given history of TIA with hemianopia, recommend surgery CardioNet monitoring to rule out A-fib.  If negative may consider loop recorder.  Recommend antiplatelet therapy and statin.  Limitation of alcohol  intake education provided.  THC cessation education provided.  PT/OT recommend home health.  For detailed assessment and plan, please refer to above/below as I have made changes wherever appropriate.   Rosalin Hawking, MD PhD Stroke Neurology 01/17/2022 6:26 PM  This patient is critically ill due to strokelike symptoms status post TNK, alcohol abuse and at significant risk of neurological worsening, death form bleeding from TNK, recurrent ischemic event, alcohol withdrawal, withdrawal seizure. This patient's care requires constant monitoring of vital signs, hemodynamics, respiratory and cardiac monitoring, review of multiple databases, neurological assessment, discussion with family, other specialists and medical decision making of high complexity. I spent 45 minutes of neurocritical care time in the care of this patient. I had long discussion with wife if at bedside and neuro hospitalist Dr. Gale Journey at [facility] med over the phone, updated pt current condition, treatment plan and potential prognosis, and answered all the questions.  They expressed understanding and appreciation.        To contact Stroke Continuity provider, please refer to [email or web address] After hours, contact General Neurology

## 2022-01-17 NOTE — Progress Notes (Signed)
OT Cancellation Note  Patient Details Name: Luis Fisher MRN: [id number] DOB: February 23, 1956   Cancelled Treatment:    Reason Eval/Treat Not Completed: Active bedrest order (BR for 24 hours per TNK protocol 9/30 at 1151)  Khaliya Golinski D Causey 01/17/2022, 7:31 AM

## 2022-01-17 NOTE — Progress Notes (Signed)
PT Cancellation Note  Patient Details Name: Fairley Copher MRN: [id number] DOB: 1956/01/04   Cancelled Treatment:    Reason Eval/Treat Not Completed: Active bedrest order remains this morning after TNK given on 9/30 at 1151. Will continue to follow and evaluate as appropriate.   [place], PT, DPT   Acute Rehabilitation Department   Sandra Cockayne 01/17/2022, 7:46 AM

## 2022-01-17 NOTE — Progress Notes (Signed)
Carotid duplex has been completed.   Preliminary results in CV Proc.   Luis Fisher 01/17/2022 11:20 AM

## 2022-01-17 NOTE — Procedures (Signed)
Routine EEG Report  Luis Fisher is a 66 y.o. male with a history of aphasia who is undergoing an EEG to evaluate for seizures.  Report: This EEG was acquired with electrodes placed according to the International 10-20 electrode system (including Fp1, Fp2, F3, F4, C3, C4, P3, P4, O1, O2, T3, T4, T5, T6, A1, A2, Fz, Cz, Pz). The following electrodes were missing or displaced: none.  The occipital dominant rhythm was 9 Hz. This activity is reactive to stimulation. Drowsiness was manifested by background fragmentation; deeper stages of sleep were not identified. There was no focal slowing. There were no interictal epileptiform discharges. There were no electrographic seizures identified. Photic stimulation and hyperventilation were not performed.  Impression: This EEG was obtained while awake and drowsy and is normal.    Clinical Correlation: Normal EEGs, however, do not rule out epilepsy.  Su Monks, MD [facility] [phone]  If 7pm- 7am, please page neurology on call as listed in [place].

## 2022-01-17 NOTE — Evaluation (Signed)
Physical Therapy Evaluation Patient Details Name: Luis Fisher MRN: [id number] DOB: 1955-07-04 Today's Date: 01/17/2022  History of Present Illness  The pt is a 66 yo male presenting 9/30 with waxing and waning R facial droop and slurred speech as well as choreiform movements of RUE. Pt given TNK upon arrival. MRI negative for stroke, undergoing continued workup. PMH includes: amaurosis fugax, arthritis, asthma, DISH (diffuse idiopathic skeletal hyperostosis, HTN, prostate cancer s/p radiation, stroke, and chronic pain.   Clinical Impression  Pt in bed upon arrival of PT, agreeable to evaluation at this time. Prior to admission the pt was independent with use of walker, cane, or crutches in his home depending on pain levels. He lives in a home with 1 step to enter with his wife, but the pt reports the wife often travels and he is independent at home when she is gone. The pt denies recent falls. The pt now presents with limitations in functional mobility, strength, power, endurance, and stability due to above dx, and will continue to benefit from skilled PT to address these deficits. The pt requires minA to complete bed mobility and up to modA to rise from EOB due to poor functional ROM, power in LE, and strength. He was then able to ambulate with use of RW and minG, but demos poor endurance which he reports is baseline. BLE with onset of shaking when pt standing to urinate after ambulation, difficult to discern if due to fatigue as pt reports or other neurological cause. Will continue to benefit from skilled PT acutely to progress functional strength, endurance, and independence with transfers but could be safe to return home if wife can provide supervision after return home.         Recommendations for follow up therapy are one component of a multi-disciplinary discharge planning process, led by the attending physician.  Recommendations may be updated based on patient status, additional functional  criteria and insurance authorization.  Follow Up Recommendations Home health PT      Assistance Recommended at Discharge Frequent or constant Supervision/Assistance  Patient can return home with the following  A little help with walking and/or transfers;A little help with bathing/dressing/bathroom;Assistance with cooking/housework;Direct supervision/assist for financial management;Direct supervision/assist for medications management;Assist for transportation;Help with stairs or ramp for entrance    Equipment Recommendations None recommended by PT (pt has needed DME)  Recommendations for Other Services       Functional Status Assessment Patient has had a recent decline in their functional status and demonstrates the ability to make significant improvements in function in a reasonable and predictable amount of time.     Precautions / Restrictions Precautions Precautions: Fall Restrictions Weight Bearing Restrictions: No      Mobility  Bed Mobility Overal bed mobility: Needs Assistance Bed Mobility: Supine to Sit     Supine to sit: Min assist     General bed mobility comments: minA to complete movements, heavy use of bed rail and increased time. minA to maintain balance with initial transition to sitting    Transfers Overall transfer level: Needs assistance Equipment used: Rolling walker (2 wheels) Transfers: Sit to/from Stand Sit to Stand: Mod assist           General transfer comment: modA to rise to standing with cues for hand placement. poor functional power in LE to rise    Ambulation/Gait Ambulation/Gait assistance: Min guard, Min assist Gait Distance (Feet): 75 Feet Assistive device: Rolling walker (2 wheels) Gait Pattern/deviations: Step-through pattern, Decreased stride length, Shuffle,  Trunk flexed Gait velocity: decreased Gait velocity interpretation: <1.31 ft/sec, indicative of household ambulator   General Gait Details: small steps with trunk flexed  and poor posture. limited endurance pt reports is baseline. no overt knee buckling or focal weakness. slowed but no overt LOB   Modified Rankin (Stroke Patients Only) Modified Rankin (Stroke Patients Only) Pre-Morbid Rankin Score: Slight disability Modified Rankin: Moderately severe disability     Balance Overall balance assessment: Needs assistance Sitting-balance support: No upper extremity supported, Feet supported Sitting balance-Leahy Scale: Fair Sitting balance - Comments: initially minA progressed to minG   Standing balance support: Bilateral upper extremity supported, During functional activity Standing balance-Leahy Scale: Poor Standing balance comment: dependent on BUE support for gait                             Pertinent Vitals/Pain Pain Assessment Pain Assessment: Faces Faces Pain Scale: Hurts little more Pain Location: generalized pain and stiffness (chronic) Pain Descriptors / Indicators: Discomfort, Grimacing Pain Intervention(s): Limited activity within patient's tolerance, Monitored during session, Repositioned    Home Living Family/patient expects to be discharged to:: Private residence Living Arrangements: Spouse/significant other Available Help at Discharge: Family;Available PRN/intermittently Type of Home: House Home Access: Stairs to enter   [employer] of Steps: 1 at back, 7-8 at front   Home Layout: One level Home Equipment: Conservation officer, nature (2 wheels);Cane - quad;Crutches Additional Comments: has reachers and grabbers    Prior Function Prior Level of Function : Independent/Modified Independent;Driving             Mobility Comments: uses cane or walker in the home depending on pain. uses electric cart for stores. 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  Communication   Communication: No difficulties (some mispronunciations during session but pt able to answer all questions appropriately)  Cognition Arousal/Alertness: Awake/alert Behavior During Therapy: [facility] for tasks assessed/performed                                            General Comments General comments (skin integrity, edema, etc.): VSS on RA    Exercises     Assessment/Plan    PT Assessment Patient needs continued PT services  PT Problem List Decreased strength;Decreased activity tolerance;Decreased balance;Decreased mobility       PT Treatment Interventions DME instruction;Gait training;Stair training;Functional mobility training;Therapeutic activities;Therapeutic exercise;Balance training;Neuromuscular re-education;Patient/family education    PT Goals (Current goals can be found in the Care Plan section)  Acute Rehab PT Goals Patient Stated Goal: return home PT Goal Formulation: With patient Time For Goal Achievement: 01/31/22 Potential to Achieve Goals: Good    Frequency Min 4X/week     Co-evaluation PT/OT/SLP Co-Evaluation/Treatment: Yes Reason for Co-Treatment: Complexity of the patient's impairments (multi-system involvement);For patient/therapist safety;To address functional/ADL transfers PT goals addressed during session: Mobility/safety with mobility;Balance;Strengthening/ROM         AM-PAC PT "6 Clicks" Mobility  Outcome Measure Help needed turning from your back to your side while in a flat bed without using bedrails?: A Little Help needed moving from lying on your back to sitting on the side of a flat bed without using  bedrails?: A Little Help needed moving to and from a bed to a chair (including a wheelchair)?: A Little Help needed standing up from a chair using your arms (e.g., wheelchair or bedside chair)?: A Lot Help needed to walk in hospital room?: A Little Help needed climbing 3-5 steps with a railing? : A Lot 6 Click Score: 16    End of Session Equipment Utilized During Treatment: Gait belt Activity Tolerance: Patient tolerated treatment well;Patient limited by fatigue Patient left: in chair;with call bell/phone within reach;with chair alarm set Nurse Communication: Mobility status PT Visit Diagnosis: Other abnormalities of gait and mobility (R26.89);Muscle weakness (generalized) (M62.81);Unsteadiness on feet (R26.81)    Time: 8616-8372 PT Time Calculation (min) (ACUTE ONLY): 31 min   Charges:   PT Evaluation $PT Eval Moderate Complexity: 1 Mod          West Carbo, PT, DPT   Acute Rehabilitation Department  Sandra Cockayne 01/17/2022, 5:45 PM

## 2022-01-18 ENCOUNTER — Other Ambulatory Visit (HOSPITAL_COMMUNITY): Payer: Self-pay

## 2022-01-18 ENCOUNTER — Other Ambulatory Visit: Payer: Self-pay | Admitting: Cardiology

## 2022-01-18 DIAGNOSIS — G459 Transient cerebral ischemic attack, unspecified: Secondary | ICD-10-CM

## 2022-01-18 DIAGNOSIS — I10 Essential (primary) hypertension: Secondary | ICD-10-CM

## 2022-01-18 DIAGNOSIS — E7800 Pure hypercholesterolemia, unspecified: Secondary | ICD-10-CM

## 2022-01-18 DIAGNOSIS — F1010 Alcohol abuse, uncomplicated: Secondary | ICD-10-CM | POA: Diagnosis not present

## 2022-01-18 DIAGNOSIS — E785 Hyperlipidemia, unspecified: Secondary | ICD-10-CM | POA: Diagnosis present

## 2022-01-18 DIAGNOSIS — I4891 Unspecified atrial fibrillation: Secondary | ICD-10-CM

## 2022-01-18 LAB — ANTI-DNA ANTIBODY, DOUBLE-STRANDED: ds DNA Ab: <1 IU/mL (ref 0–9)

## 2022-01-18 LAB — BASIC METABOLIC PANEL
Anion gap: 9 (ref 5–15)
BUN: 8 mg/dL (ref 8–23)
CO2: 23 mmol/L (ref 22–32)
Calcium: 8.8 mg/dL — ABNORMAL LOW (ref 8.9–10.3)
Chloride: 104 mmol/L (ref 98–111)
Creatinine, Ser: 1.03 mg/dL (ref 0.61–1.24)
GFR, Estimated: >60 mL/min (ref >60)
Glucose, Bld: 104 mg/dL — ABNORMAL HIGH (ref 70–99)
Potassium: 3.6 mmol/L (ref 3.5–5.1)
Sodium: 136 mmol/L (ref 135–145)

## 2022-01-18 LAB — CBC
HCT: 38.4 % — ABNORMAL LOW (ref 39.0–52.0)
Hemoglobin: 13.5 g/dL (ref 13.0–17.0)
MCH: 32.6 pg (ref 26.0–34.0)
MCHC: 35.2 g/dL (ref 30.0–36.0)
MCV: 92.8 fL (ref 80.0–100.0)
Platelets: 183 10*3/uL (ref 150–400)
RBC: 4.14 MIL/uL — ABNORMAL LOW (ref 4.22–5.81)
RDW: 12.3 % (ref 11.5–15.5)
WBC: 6 10*3/uL (ref 4.0–10.5)
nRBC: 0 % (ref 0.0–0.2)

## 2022-01-18 LAB — ANTISTREPTOLYSIN O TITER: ASO: <20 IU/mL (ref 0.0–200.0)

## 2022-01-18 LAB — CERULOPLASMIN: Ceruloplasmin: 23.4 mg/dL (ref 16.0–31.0)

## 2022-01-18 MED ORDER — ROSUVASTATIN CALCIUM 20 MG PO TABS
20.0000 mg | ORAL_TABLET | Freq: Every day | ORAL | 0 refills | Status: DC
  Filled 2022-01-18: qty 30, 30d supply, fill #0

## 2022-01-18 MED ORDER — ASPIRIN 81 MG PO TBEC
81.0000 mg | DELAYED_RELEASE_TABLET | Freq: Every day | ORAL | 12 refills | Status: AC
  Filled 2022-01-18: qty 30, 30d supply, fill #0

## 2022-01-18 MED ORDER — FOLIC ACID 1 MG PO TABS
1.0000 mg | ORAL_TABLET | Freq: Every day | ORAL | 0 refills | Status: DC
  Filled 2022-01-18: qty 30, 30d supply, fill #0

## 2022-01-18 MED ORDER — ADULT MULTIVITAMIN W/MINERALS CH
1.0000 | ORAL_TABLET | Freq: Every day | ORAL | 0 refills | Status: DC
  Filled 2022-01-18: qty 30, 30d supply, fill #0

## 2022-01-18 MED ORDER — CLOPIDOGREL BISULFATE 75 MG PO TABS
75.0000 mg | ORAL_TABLET | Freq: Every day | ORAL | 0 refills | Status: DC
  Filled 2022-01-18: qty 21, 21d supply, fill #0

## 2022-01-18 MED ORDER — ASPIRIN 81 MG PO TBEC
81.0000 mg | DELAYED_RELEASE_TABLET | Freq: Every day | ORAL | Status: DC
  Administered 2022-01-18: 81 mg via ORAL
  Filled 2022-01-18: qty 1

## 2022-01-18 MED ORDER — CLOPIDOGREL BISULFATE 75 MG PO TABS
75.0000 mg | ORAL_TABLET | Freq: Every day | ORAL | Status: DC
  Administered 2022-01-18: 75 mg via ORAL
  Filled 2022-01-18: qty 1

## 2022-01-18 MED ORDER — THIAMINE HCL 100 MG PO TABS
100.0000 mg | ORAL_TABLET | Freq: Every day | ORAL | 0 refills | Status: DC
  Filled 2022-01-18: qty 30, 30d supply, fill #0

## 2022-01-18 NOTE — TOC Transition Note (Signed)
Transition of Care [facility]) - CM/SW Discharge Note   Patient Details  Name: Franklyn Cafaro MRN: [id number] Date of Birth: 1955-06-29  [facility]) CM/SW Contact:  Ella Bodo, RN Phone Number: 01/18/2022, 12:05 PM   Clinical Narrative:    The pt is a 66 yo male presenting 9/30 with waxing and waning R facial droop and slurred speech as well as choreiform movements of RUE. Pt given TNK upon arrival. MRI negative for stroke, undergoing continued workup. PTA, patient fairly independent and living at home with spouse, who can provide needed assistance at discharge.  PT/OT recommending [place] follow up, and patient agreeable to services.  Referral to [facility] for continued home therapies.  No DME needed, per PT/OT and patient/wife.   Final next level of care: Home w Home Health Services Barriers to Discharge: Barriers Resolved   Patient Goals and CMS Choice   CMS Medicare.gov Compare Post Acute Care list provided to:: Patient Represenative (must comment) (wife) Choice offered to / list presented to : Spouse                      Discharge Plan and Services   Discharge Planning Services: CM Consult Post Acute Care Choice: Home Health                    HH Arranged: PT [place]: [facility] Date [facility] [place] [facility] Contacted: 01/18/22 Time [place]: [phone] Representative spoke with at [place]: [place] Determinants of Health ([place]) Interventions     Readmission Risk Interventions     No data to display         Reinaldo Raddle, RN, BSN  Trauma/Neuro ICU Case Manager [phone]

## 2022-01-18 NOTE — Discharge Summary (Addendum)
Stroke Discharge Summary  Patient ID: Luis Fisher   MRN: [id number]      DOB: 02-14-1956  Date of Admission: 01/16/2022 Date of Discharge: 01/18/2022  Attending Physician:  Stroke, Md, MD, Stroke MD Patient's PCP:  Farrel Conners, MD  DISCHARGE DIAGNOSIS:  Principal Problem:   Stroke-like episode with hemiballism s/p TNK   Possible TIA  Active Problems:   Hx of possible TIA     Essential hypertension   Hyperlipidemia   ETOH abuse   Prostate cancer   Allergies as of 01/18/2022       Reactions   Atorvastatin Other (See Comments)   Muscle aches, memory loss   Contrast Media [iodinated Contrast Media] Other (See Comments)   Oral IV Contrast, Convulsions        Medication List     STOP taking these medications    aspirin 325 MG tablet Replaced by: aspirin EC 81 MG tablet   BIOFREEZE ROLL-ON EX   clobetasol cream 0.05 % Commonly known as: TEMOVATE   ICY HOT BACK EX       TAKE these medications    allopurinol 100 MG tablet Commonly known as: ZYLOPRIM Take 1 tablet (100 mg total) by mouth daily.   amLODipine 10 MG tablet Commonly known as: NORVASC Take 1 tablet (10 mg total) by mouth daily.   aspirin EC 81 MG tablet Take 1 tablet (81 mg total) by mouth daily. Swallow whole. Start taking on: January 19, 2022 Replaces: aspirin 325 MG tablet   clopidogrel 75 MG tablet Commonly known as: PLAVIX Take 1 tablet (75 mg total) by mouth daily. Start taking on: January 19, 2022   cyanocobalamin 250 MCG tablet Commonly known as: VITAMIN B12 Take 250 mcg by mouth daily.   D3 PO Take 1 capsule by mouth daily.   fluticasone 50 MCG/ACT nasal spray Commonly known as: FLONASE USE 1-2 SPRAYS IN EACH NOSTRIL DAILY AS NEEDED. What changed:  how much to take how to take this when to take this reasons to take this additional instructions   folic acid 1 MG tablet Commonly known as: FOLVITE Take 1 tablet (1 mg total) by mouth daily. Start taking on:  January 19, 2022   HYDROcodone-acetaminophen 5-325 MG tablet Commonly known as: Norco Take 1 tablet by mouth 2 (two) times daily as needed for moderate pain or severe pain.   lidocaine 5 % Commonly known as: LIDODERM APPLY 1 PATCH TO THE SKIN DAILY. REMOVE & DISCARD PATCH WITHIN 12 HOURS OR AS DIRECTED BY MD. What changed:  how much to take how to take this when to take this additional instructions   meloxicam 7.5 MG tablet Commonly known as: MOBIC Take 1 tablet (7.5 mg total) by mouth daily as needed for pain.   metoprolol tartrate 100 MG tablet Commonly known as: LOPRESSOR Take 1 tablet (100 mg total) by mouth 2 (two) times daily.   multivitamin with minerals Tabs tablet Take 1 tablet by mouth daily. Start taking on: January 19, 2022   predniSONE 20 MG tablet Commonly known as: DELTASONE TAKE ONE TABLET BY MOUTH TWICE DAILY AS NEEDED FOR ARTHRITIS FLARE What changed:  how much to take how to take this when to take this reasons to take this additional instructions   rosuvastatin 20 MG tablet Commonly known as: CRESTOR Take 1 tablet (20 mg total) by mouth daily. Start taking on: January 19, 2022   thiamine 100 MG tablet Commonly known as: Vitamin B-1 Take 1  tablet (100 mg total) by mouth daily. Start taking on: January 19, 2022   traMADol 50 MG tablet Commonly known as: ULTRAM TAKE ONE OR TWO TABLETS BY MOUTH EVERY SIX HOURS AS NEEDED FOR MODERATE PAIN What changed:  how much to take how to take this when to take this reasons to take this additional instructions        LABORATORY STUDIES CBC    Component Value Date/Time   WBC 6.0 01/18/2022 0254   RBC 4.14 (L) 01/18/2022 0254   HGB 13.5 01/18/2022 0254   HCT 38.4 (L) 01/18/2022 0254   PLT 183 01/18/2022 0254   MCV 92.8 01/18/2022 0254   MCH 32.6 01/18/2022 0254   MCHC 35.2 01/18/2022 0254   RDW 12.3 01/18/2022 0254   LYMPHSABS 2.6 01/16/2022 1129   MONOABS 0.5 01/16/2022 1129   EOSABS 0.1  01/16/2022 1129   BASOSABS 0.0 01/16/2022 1129   CMP    Component Value Date/Time   NA 136 01/18/2022 0254   K 3.6 01/18/2022 0254   CL 104 01/18/2022 0254   CO2 23 01/18/2022 0254   GLUCOSE 104 (H) 01/18/2022 0254   BUN 8 01/18/2022 0254   CREATININE 1.03 01/18/2022 0254   CREATININE 0.92 03/12/2020 1454   CALCIUM 8.8 (L) 01/18/2022 0254   PROT 7.0 01/16/2022 1129   ALBUMIN 4.0 01/16/2022 1129   AST 38 01/16/2022 1129   ALT 34 01/16/2022 1129   ALKPHOS 19 (L) 01/16/2022 1129   BILITOT 1.4 (H) 01/16/2022 1129   GFRNONAA >60 01/18/2022 0254   GFRAA >60 10/12/2014 1654   COAGS Lab Results  Component Value Date   INR 1.0 01/16/2022   INR 0.96 12/26/2012   Lipid Panel    Component Value Date/Time   CHOL 212 (H) 01/16/2022 1244   TRIG 191 (H) 01/16/2022 1244   HDL 46 01/16/2022 1244   CHOLHDL 4.6 01/16/2022 1244   VLDL 38 01/16/2022 1244   LDLCALC 128 (H) 01/16/2022 1244   LDLCALC 110 (H) 03/12/2020 1454   HgbA1C  Lab Results  Component Value Date   HGBA1C 5.0 01/16/2022   Urinalysis    Component Value Date/Time   COLORURINE YELLOW 10/12/2014 1750   APPEARANCEUR CLEAR (A) 10/12/2014 1750   LABSPEC 1.004 (L) 10/12/2014 1750   PHURINE 6.0 10/12/2014 1750   GLUCOSEU NEGATIVE 10/12/2014 1750   HGBUR TRACE (A) 10/12/2014 1750   BILIRUBINUR NEGATIVE 10/12/2014 1750   BILIRUBINUR neg 05/03/2014 1147   KETONESUR NEGATIVE 10/12/2014 1750   PROTEINUR NEGATIVE 10/12/2014 1750   UROBILINOGEN 0.2 10/12/2014 1750   NITRITE NEGATIVE 10/12/2014 1750   LEUKOCYTESUR NEGATIVE 10/12/2014 1750   Urine Drug Screen     Component Value Date/Time   LABOPIA POSITIVE (A) 01/17/2022 1512   COCAINSCRNUR NONE DETECTED 01/17/2022 1512   LABBENZ POSITIVE (A) 01/17/2022 1512   AMPHETMU NONE DETECTED 01/17/2022 1512   THCU POSITIVE (A) 01/17/2022 1512   LABBARB NONE DETECTED 01/17/2022 1512    Alcohol Level    Component Value Date/Time   ETH <10 01/16/2022 1129      SIGNIFICANT DIAGNOSTIC STUDIES VAS US CAROTID  Result Date: 01/18/2022 Carotid Arterial Duplex Study Patient Name:  Luis Fisher  Date of Exam:   01/17/2022 Medical Rec #: [id number]    Accession #:    [id number] Date of Birth: 10-Jul-1955    Patient Gender: M Patient Age:   41 years Exam Location:  [facility] Procedure:      VAS US CAROTID Referring Phys: Alferd Patee  KHALIQDINA --------------------------------------------------------------------------------  Indications:  CVA. Risk Factors: Hypertension, prior CVA. Performing Technologist: Archie Patten RVS  Examination Guidelines: A complete evaluation includes B-mode imaging, spectral Doppler, color Doppler, and power Doppler as needed of all accessible portions of each vessel. Bilateral testing is considered an integral part of a complete examination. Limited examinations for reoccurring indications may be performed as noted.  Right Carotid Findings: +----------+--------+--------+--------+------------------+--------+           PSV cm/sEDV cm/sStenosisPlaque DescriptionComments +----------+--------+--------+--------+------------------+--------+ CCA Prox  89      12              heterogenous               +----------+--------+--------+--------+------------------+--------+ CCA Distal71      12              heterogenous               +----------+--------+--------+--------+------------------+--------+ ICA Prox  94      17      1-39%   heterogenous               +----------+--------+--------+--------+------------------+--------+ ICA Distal53      13                                         +----------+--------+--------+--------+------------------+--------+ ECA       118                                                +----------+--------+--------+--------+------------------+--------+ +----------+--------+-------+--------+-------------------+           PSV cm/sEDV cmsDescribeArm Pressure (mmHG)  +----------+--------+-------+--------+-------------------+ Subclavian100                                        +----------+--------+-------+--------+-------------------+ +---------+--------+--+--------+--+---------+ VertebralPSV cm/s43EDV cm/s12Antegrade +---------+--------+--+--------+--+---------+  Left Carotid Findings: +----------+--------+--------+--------+------------------+--------+           PSV cm/sEDV cm/sStenosisPlaque DescriptionComments +----------+--------+--------+--------+------------------+--------+ CCA Prox  75      16              heterogenous               +----------+--------+--------+--------+------------------+--------+ CCA Distal89      19              heterogenous               +----------+--------+--------+--------+------------------+--------+ ICA Prox  53      18      1-39%   heterogenous               +----------+--------+--------+--------+------------------+--------+ ICA Distal72      23                                         +----------+--------+--------+--------+------------------+--------+ ECA       86                                                 +----------+--------+--------+--------+------------------+--------+ +----------+--------+--------+--------+-------------------+  PSV cm/sEDV cm/sDescribeArm Pressure (mmHG) +----------+--------+--------+--------+-------------------+ Subclavian121                                         +----------+--------+--------+--------+-------------------+ +---------+--------+--+--------+-+---------+ VertebralPSV cm/s41EDV cm/s8Antegrade +---------+--------+--+--------+-+---------+   Summary: Right Carotid: Velocities in the right ICA are consistent with a 1-39% stenosis. Left Carotid: Velocities in the left ICA are consistent with a 1-39% stenosis. Vertebrals: Bilateral vertebral arteries demonstrate antegrade flow. *See table(s) above for measurements and  observations.  Electronically signed by Antony Contras MD on 01/18/2022 at 8:25:24 AM.    Final    MR BRAIN W WO CONTRAST  Result Date: 01/17/2022 CLINICAL DATA:  Stroke follow-up, status post TNK EXAM: MRI HEAD WITHOUT AND WITH CONTRAST TECHNIQUE: Multiplanar, multiecho pulse sequences of the brain and surrounding structures were obtained without and with intravenous contrast. CONTRAST:  7.5 mL Vueway COMPARISON:  01/16/2022 MRI head, correlation is also made with 01/16/2022 CT head FINDINGS: Brain: No abnormal parenchymal or meningeal enhancement. No restricted diffusion to suggest acute or subacute infarct. No acute hemorrhage, mass, mass effect, or midline shift. No hemosiderin deposition to suggest remote hemorrhage. Scattered T2 hyperintense signal in the periventricular white matter, likely the sequela of mild-to-moderate chronic small vessel ischemic disease. Vascular: Normal arterial flow voids. Normal arterial and venous enhancement. Skull and upper cervical spine: Normal marrow signal. Sinuses/Orbits: No acute finding. Other: The mastoids are well aerated. Redemonstrated predominantly T2 hyperintense lesion in the left parotid gland, which measures up to 15 mm and demonstrates some peripheral enhancement. IMPRESSION: 1. No acute intracranial process. No evidence of acute or subacute infarct. 2. Redemonstrated predominantly T2 hyperintense lesion in the left parotid gland, which measures up to 15 mm and demonstrates some peripheral enhancement. This may reflect a primary parotid neoplasm but is indeterminate. An ENT consultation is recommended. Electronically Signed   By: Merilyn Baba M.D.   On: 01/17/2022 20:08   EEG adult  Result Date: 01/17/2022 Derek Jack, MD     01/17/2022  2:17 PM Routine EEG Report Awais Cobarrubias is a 66 y.o. male with a history of aphasia who is undergoing an EEG to evaluate for seizures. Report: This EEG was acquired with electrodes placed according to the International  10-20 electrode system (including Fp1, Fp2, F3, F4, C3, C4, P3, P4, O1, O2, T3, T4, T5, T6, A1, A2, Fz, Cz, Pz). The following electrodes were missing or displaced: none. The occipital dominant rhythm was 9 Hz. This activity is reactive to stimulation. Drowsiness was manifested by background fragmentation; deeper stages of sleep were not identified. There was no focal slowing. There were no interictal epileptiform discharges. There were no electrographic seizures identified. Photic stimulation and hyperventilation were not performed. Impression: This EEG was obtained while awake and drowsy and is normal.   Clinical Correlation: Normal EEGs, however, do not rule out epilepsy. Su Monks, MD [facility] [phone] If 7pm- 7am, please page neurology on call as listed in [facility].   MR ANGIO HEAD WO CONTRAST  Result Date: 01/16/2022 CLINICAL DATA:  Neuro deficit, acute, stroke suspected. EXAM: MRA HEAD WITHOUT CONTRAST TECHNIQUE: Angiographic images of the Circle of Willis were acquired using MRA technique without intravenous contrast. COMPARISON:  Same-day brain MRI 01/16/2022. Noncontrast head CT 01/16/2022. FINDINGS: Moderately motion degraded examination. Anterior circulation: The intracranial internal carotid arteries are patent. The M1 middle cerebral arteries are patent. No M2 proximal branch occlusion or high-grade proximal  stenosis. The anterior cerebral arteries are patent. No intracranial aneurysm is identified. Posterior circulation: The intracranial vertebral arteries are patent. The basilar artery is patent. The posterior cerebral arteries are patent. Posterior communicating arteries are diminutive or absent, bilaterally. Anatomic variants: As described. IMPRESSION: Moderately motion degraded examination. No intracranial large vessel occlusion. Within the limitations of motion degradation, no severe proximal arterial stenosis is identified. Electronically Signed   By: Kellie Simmering D.O.    On: 01/16/2022 17:53   MR BRAIN WO CONTRAST  Result Date: 01/16/2022 CLINICAL DATA:  Neuro deficit, acute, stroke suspected. EXAM: MRI HEAD WITHOUT CONTRAST TECHNIQUE: Multiplanar, multiecho pulse sequences of the brain and surrounding structures were obtained without intravenous contrast. COMPARISON:  Noncontrast head CT performed earlier today. FINDINGS: Intermittently motion degraded examination. Most notably, there is moderate motion degradation of the axial diffusion-weighted sequence, moderate to severe motion degradation of the axial T2 FLAIR sequence, moderate motion degradation of the axial T2 sequence, moderate to severe motion degradation of the axial T1 sequence, moderate motion degradation of the axial SWI sequence and moderate to severe motion degradation of the coronal T2 weighted sequence. Brain: Mild generalized parenchymal atrophy. Mild multifocal T2 FLAIR hyperintense signal abnormality within the cerebral white matter, nonspecific but compatible with chronic small vessel disease. Within the limitations of motion degradation, no acute infarct is identified. No intracranial mass, chronic intracranial blood products or extra-axial fluid collection appreciate. No midline shift. Vascular: Maintained flow voids within the proximal large arterial vessels. Skull and upper cervical spine: No suspicious marrow lesion. Incompletely assessed cervical spondylosis. Sinuses/Orbits: No mass or acute finding within the imaged orbits. No significant paranasal sinus disease. Other: 17 mm predominantly T2 hyperintense lesion within the left parotid gland. IMPRESSION: Significantly motion degraded examination, as described. Within this limitation, there is no evidence of acute intracranial abnormality. Mild chronic small vessel ischemic changes within the cerebral white matter. Mild generalized parenchymal atrophy. 17 mm predominantly T2 hyperintense lesion within the left parotid gland. This may reflect a  primary parotid neoplasm or abnormal lymph node. ENT referral recommended. Electronically Signed   By: Kellie Simmering D.O.   On: 01/16/2022 17:45   ECHOCARDIOGRAM COMPLETE  Result Date: 01/16/2022    ECHOCARDIOGRAM REPORT   Patient Name:   SARON TWEED Date of Exam: 01/16/2022 Medical Rec #:  [id number]   Height:       68.0 in Accession #:    [id number]  Weight:       224.4 lb Date of Birth:  1956/01/19   BSA:          2.146 m Patient Age:    66 years    BP:           143/118 mmHg Patient Gender: M           HR:           95 bpm. Exam Location:  Inpatient Procedure: 2D Echo, Cardiac Doppler, Color Doppler and Intracardiac            Opacification Agent Indications:    Stroke  History:        Patient has no prior history of Echocardiogram examinations.  Sonographer:    Merrie Roof RDCS Referring Phys: [id number] [place]  1. Left ventricular ejection fraction, by estimation, is 60 to 65%. The left ventricle has normal function. The left ventricle has no regional wall motion abnormalities. There is mild concentric left ventricular hypertrophy. Left ventricular diastolic parameters are consistent with Grade I diastolic dysfunction (  impaired relaxation).  2. Right ventricular systolic function is normal. The right ventricular size is normal. Tricuspid regurgitation signal is inadequate for assessing PA pressure.  3. The mitral valve is grossly normal. Trivial mitral valve regurgitation.  4. The aortic valve is tricuspid. There is mild calcification of the aortic valve. Aortic valve regurgitation is not visualized. Aortic valve sclerosis/calcification is present, without any evidence of aortic stenosis. Aortic valve mean gradient measures 5.0 mmHg.  5. The inferior vena cava is normal in size with greater than 50% respiratory variability, suggesting right atrial pressure of 3 mmHg. Comparison(s): No prior Echocardiogram. FINDINGS  Left Ventricle: Left ventricular ejection fraction, by estimation, is 60  to 65%. The left ventricle has normal function. The left ventricle has no regional wall motion abnormalities. The left ventricular internal cavity size was normal in size. There is  mild concentric left ventricular hypertrophy. Left ventricular diastolic parameters are consistent with Grade I diastolic dysfunction (impaired relaxation). Right Ventricle: The right ventricular size is normal. No increase in right ventricular wall thickness. Right ventricular systolic function is normal. Tricuspid regurgitation signal is inadequate for assessing PA pressure. Left Atrium: Left atrial size was normal in size. Right Atrium: Right atrial size was normal in size. Pericardium: There is no evidence of pericardial effusion. Presence of epicardial fat layer. Mitral Valve: The mitral valve is grossly normal. Trivial mitral valve regurgitation. Tricuspid Valve: The tricuspid valve is grossly normal. Tricuspid valve regurgitation is trivial. Aortic Valve: The aortic valve is tricuspid. There is mild calcification of the aortic valve. There is mild to moderate aortic valve annular calcification. Aortic valve regurgitation is not visualized. Aortic valve sclerosis/calcification is present, without any evidence of aortic stenosis. Aortic valve mean gradient measures 5.0 mmHg. Aortic valve peak gradient measures 10.1 mmHg. Aortic valve area, by VTI measures 2.47 cm. Pulmonic Valve: The pulmonic valve was grossly normal. Pulmonic valve regurgitation is trivial. Aorta: The aortic root is normal in size and structure. Venous: The inferior vena cava is normal in size with greater than 50% respiratory variability, suggesting right atrial pressure of 3 mmHg. IAS/Shunts: No atrial level shunt detected by color flow Doppler.  LEFT VENTRICLE PLAX 2D LVIDd:         5.10 cm   Diastology LVIDs:         3.60 cm   LV e' medial:    4.90 cm/s LV PW:         1.20 cm   LV E/e' medial:  11.0 LV IVS:        1.30 cm   LV e' lateral:   5.87 cm/s LVOT  diam:     2.00 cm   LV E/e' lateral: 9.2 LV SV:         64 LV SV Index:   30 LVOT Area:     3.14 cm  RIGHT VENTRICLE RV Basal diam:  3.20 cm RV S prime:     11.00 cm/s TAPSE (M-mode): 1.8 cm LEFT ATRIUM           Index        RIGHT ATRIUM           Index LA diam:      3.70 cm 1.72 cm/m   RA Area:     16.00 cm LA Vol (A2C): 64.7 ml 30.14 ml/m  RA Volume:   33.40 ml  15.56 ml/m LA Vol (A4C): 59.7 ml 27.81 ml/m  AORTIC VALVE AV Area (Vmax):    2.43 cm AV Area (  Vmean):   2.35 cm AV Area (VTI):     2.47 cm AV Vmax:           159.00 cm/s AV Vmean:          102.000 cm/s AV VTI:            0.258 m AV Peak Grad:      10.1 mmHg AV Mean Grad:      5.0 mmHg LVOT Vmax:         123.00 cm/s LVOT Vmean:        76.400 cm/s LVOT VTI:          0.203 m LVOT/AV VTI ratio: 0.79  AORTA Ao Asc diam: 3.50 cm MITRAL VALVE MV Area (PHT): 4.06 cm    SHUNTS MV Decel Time: 187 msec    Systemic VTI:  0.20 m MV E velocity: 53.80 cm/s  Systemic Diam: 2.00 cm MV A velocity: 92.70 cm/s MV E/A ratio:  0.58 Rozann Lesches MD Electronically signed by Rozann Lesches MD Signature Date/Time: 01/16/2022/2:46:50 PM    Final    CT HEAD CODE STROKE WO CONTRAST  Result Date: 01/16/2022 CLINICAL DATA:  Code stroke. Neuro deficit, acute, stroke suspected. EXAM: CT HEAD WITHOUT CONTRAST TECHNIQUE: Contiguous axial images were obtained from the base of the skull through the vertex without intravenous contrast. RADIATION DOSE REDUCTION: This exam was performed according to the departmental dose-optimization program which includes automated exposure control, adjustment of the mA and/or kV according to patient size and/or use of iterative reconstruction technique. COMPARISON:  No pertinent prior exams available for comparison. FINDINGS: Motion degraded examination. Brain: Mild generalized cerebral atrophy. Minimal patchy and ill-defined hypoattenuation within the cerebral white matter, nonspecific but compatible with chronic small vessel ischemic  disease. There is no acute intracranial hemorrhage. No demarcated cortical infarct. No extra-axial fluid collection. No evidence of an intracranial mass. No midline shift. Vascular: No hyperdense vessel.  Atherosclerotic calcifications. Skull: No fracture or aggressive osseous lesion. Sinuses/Orbits: No mass or acute finding within the imaged orbits. No significant paranasal sinus disease at the imaged levels. ASPECTS ([place] Stroke Program Early CT Score) - Ganglionic level infarction (caudate, lentiform nuclei, internal capsule, insula, M1-M3 cortex): 7 - Supraganglionic infarction (M4-M6 cortex): 3 Total score (0-10 with 10 being normal): 10 These results were communicated to Dr. Lorrin Goodell At 11:42 amon 9/30/2023by text page via the [facility] messaging system. IMPRESSION: No evidence of acute intracranial abnormality. Minimal chronic small-vessel ischemic changes within the cerebral white matter. Mild generalized cerebral atrophy. Electronically Signed   By: Kellie Simmering D.O.   On: 01/16/2022 11:45      HISTORY OF PRESENT ILLNESS Shontez Sermon is a 66 y.o. male with PMH significant for amourosis fugax, DISH, HTN, asthma, hypothyroidism, prediabetes went to bed at midnight after having 4 mixed cocktails with his last drink around 2300 on 01/15/2022.  He woke up in the morning at 0600 and was fine.  He went back to bed and woke up again at 0900 and was still fine. He woke up at 1030AM on 12/3021 with slurred speech and a ?R facial droop. EMS called and symptoms were waxing and waning on arrival to the ED.   His NIHSS was a 0 but he was noted to have choreiform movements of his RUE with his hands kind of making circles and his RLE with him bending his R knee and twisting his R foot. He is able to suppress them for a few secs before they appear again. These  are not rhythmic or repetitive.   He had 4 mixed cocktails last night with his last drink at 2300. Drink of choice is burboun and drinks almost every night.  Never been through withdrawal. Never had anything like this in the past, no family hx of huntington's disease or wilson's disease.   CTH without contrast was negative for an acute stroke.    LKW: 9AM on 01/16/22. mRS: 0 tNKASE: despite NIHSS of 0, these sudden onset movements are quite disabling. Discussed that I am not entirely sure if these are stroke but stroke of STN, lentifrom nucleus or thalamus can cause sudden onset ballistic movements. This manifestation of stroke is rare. Given the disabling nature of these movements, tnkase was offered.  I discussed with patient the risks and benefits of tnkase and he agreed to tnkase. I spoke with his wife and sister in law and with another physician in their family and agreed to tnkase. Thrombectomy: not offered, low suspicion for LVO. No cortical sign, no aphasia, no vision deficit, no neglect.  HOSPITAL COURSE Mr. Ercel Pepitone is a 66 y.o. male with history of questionable amaurosis fugax vs. TIA, DISH, HTN, asthma, prostate cancer, hypothyroidism and ETOH abuse presenting with acute onset slurred speech, right facial droop and right sided choreiform movements.  Symptoms were felt to be due to a stroke, and TNK was administered.  Patient's symptoms did improve after this.  MRI was negative for stroke but very motion degraded.     Stroke-like episode with hemiballism s/p TNK Possible TIA Code Stroke CT head No acute abnormality. ASPECTS 10.    MRI  x 2 no acute intracranial abnormality.  MRA  Motion degraded, no intracranial LVO Carotid Doppler unremarkable 2D Echo EF 60-65% EEG normal Consider 30-day cardiac event monitoring as outpatient to rule out A-fib given history of likely TIA.  If negative, may consider loop recorder later LDL 128 HgbA1c 5.0 UDS positive for THC VTE prophylaxis - SCDs aspirin 325 mg daily prior to admission, now on DAPT for 3 weeks and then plavix alone.  Therapy recommendations: Home health Disposition:  pending    History of possible TIA Patient self reported heavy load of hemianopia 10 years ago Follow up with ophthalmology at the time concerning for amaurosis fugax Recommend long term cardiac monitoring to rule out A-fib   Hypertension Home meds:  amlodipine 10 mg daily Stable Keep BP <180/105 Long-term BP goal normotensive   Hyperlipidemia Home meds:  none LDL 128, goal < 70 Add rosuvastatin 20 mg daily  Continue statin at discharge   Heavy ETOH use Patient drinks about four drinks daily No history of withdrawal CIWA protocol Multivitamin/folic acid/B1   Other Stroke Risk Factors Advanced Age >/= 13  Former Cigarette smoker Obesity, Body mass index is 34.12 kg/m., BMI >/= 30 associated with increased stroke risk, recommend weight loss, diet and exercise as appropriate    Other Active Problems 17 mm 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 rate 15, height '5\' 8"'$  (1.727 m), weight 101.8 kg, SpO2 96 %.   Temp:  [97.7 F (36.5 C)-98.9 F (37.2 C)] 98.3 F (36.8 C) (10/02 1153) Pulse Rate:  [84-124] 99 (10/02 1000) Resp:  [15-28] 17 (10/02 1300) BP: (116-168)/(63-113) 143/81 (10/02 1100) SpO2:  [90 %-97 %] 96 % (10/02 1000)  General - Well nourished, well  developed, in no apparent distress. Cardiovascular - Regular rhythm and rate.  Mental Status -  Level of arousal and orientation to time, place, and person were intact. Language including expression, naming, repetition, comprehension was assessed and found intact. Attention span and concentration were normal. Recent and remote memory were intact. Fund of Knowledge was assessed and was intact.  Cranial Nerves II - XII - II - Visual field intact OU. III, IV, VI - Extraocular movements intact. V - Facial sensation intact bilaterally. VII -  Facial movement intact bilaterally. VIII - Hearing & vestibular intact bilaterally. X - Palate elevates symmetrically. XI - Chin turning & shoulder shrug intact bilaterally. XII - Tongue protrusion intact.  Motor Strength - The patient's strength was normal in all extremities and pronator drift was absent.  Bulk was normal and fasciculations were absent.   Motor Tone - Muscle tone was assessed at the neck and appendages and was normal. Sensory - Light touch, temperature/pinprick were assessed and were symmetrical.   Coordination - The patient had normal movements in the hands and feet with no ataxia or dysmetria.  Tremor was absent. Gait and Station - deferred.   Discharge Diet       Diet   Diet regular Room service appropriate? Yes with Assist; Fluid consistency: Thin   liquids  DISCHARGE PLAN Disposition:  home aspirin 81 mg daily and clopidogrel 75 mg daily for secondary stroke prevention for 3 weeks then ASA alone. Ongoing stroke risk factor control by Primary Care Physician at time of discharge Follow-up PCP Farrel Conners, MD in 2 weeks. Follow-up in [facility] in 8 weeks, office to schedule an appointment.   50 minutes were spent preparing discharge.  Beulah Gandy DNP, ACNPC-AG    ATTENDING NOTE: I reviewed above note and agree with the assessment and plan. Pt was seen and examined.   Wife at the bedside, pt overnight no acute issue, neuro at baseline. Repeat MRI did not show acute infarct, EEG normal. PT/OT recommend home health. Pt is on DAPT and statin on discharge. Follow up at [place] in 4 weeks.  For detailed assessment and plan, please refer to above/below as I have made changes wherever appropriate.   Rosalin Hawking, MD PhD Stroke Neurology 01/18/2022 11:27 PM
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
## 2022-01-18 NOTE — Progress Notes (Signed)
Late entry.   OT impression: Luis Fisher was evaluated d/p the below admission list, he is typically mod I at baseline with use of DME/AE for ADLs and AD for ambulation when needed. He lives with his wife, who travels a lot, and is a limited Hydrographic surveyor. Upon evaluation pt was limited by chronic pain and stiffness, decreased activity tolerance general weakness, impaired cognition and jerking/shaking movement while standing to complete ADLs. Overall he require dmin A for bed mobility, mod A for sit<>stand tranfers and min G for ambulation with RW. Due to the deficits listed below he requires up to mod A to complete ADLs. OT to continue to follow acutely. Recommend d/c to  home with [place].    01/17/22 1951  OT Visit Information  Last OT Received On 01/18/22  Assistance Needed +1  PT/OT/SLP Co-Evaluation/Treatment Yes  Reason for Co-Treatment Complexity of the patient's impairments (multi-system involvement);For patient/therapist safety;To address functional/ADL transfers  OT goals addressed during session ADL's and self-care  History of Present Illness The pt is a 66 yo male presenting 9/30 with waxing and waning R facial droop and slurred speech as well as choreiform movements of RUE. Pt given TNK upon arrival. MRI negative for stroke, undergoing continued workup. PMH includes: amaurosis fugax, arthritis, asthma, DISH (diffuse idiopathic skeletal hyperostosis, HTN, prostate cancer s/p radiation, stroke, and chronic pain.  Precautions  Precautions Fall  Restrictions  Weight Bearing Restrictions No  Home Living  Family/patient expects to be discharged to: Private residence  Living Arrangements Spouse/significant other  Available Help at Discharge Family;Available PRN/intermittently  Type of Home House  Home Access Stairs to enter  Entrance Stairs-Number of Steps 1 at back, 7-8 at front  Home Layout One level  Bathroom Shower/Tub Tub/shower unit  Financial planner (2 wheels);Cane - quad;Crutches;Adaptive equipment  Adaptive Equipment Reacher;Sock aid  Additional Comments wife travels a lot  Prior Function  Prior Level of Function  Independent/Modified Independent;Driving  Mobility Comments uses cane or walker in the home depending on pain. uses electric cart for stores. poor endurance  ADLs Comments pt reports independence  Communication  Communication No difficulties  Pain Assessment  Pain Assessment Faces  Faces Pain Scale 4  Pain Location generalized pain and stiffness (chronic)  Pain Descriptors / Indicators Discomfort;Grimacing  Pain Intervention(s) Monitored during session  Cognition  Arousal/Alertness Awake/alert  Behavior During Therapy Anxious  Overall Cognitive Status No family/caregiver present to determine baseline cognitive functioning  General Comments OVerall WFL for tasks assessed. Pt did have difficulty with dual tasking and multistep directions. Also verbose and mildly self-distracting  Upper Extremity Assessment  Upper Extremity Assessment Generalized weakness  Lower Extremity Assessment  Lower Extremity Assessment Defer to PT evaluation  Cervical / Trunk Assessment  Cervical / Trunk Assessment Kyphotic;Other exceptions  Cervical / Trunk Exceptions chronic spine pain and pt reports "bamboo spine" due to DISH  Vision- History  Baseline Vision/History 0 No visual deficits  Patient Visual Report No change from baseline  Vision- Assessment  Vision Assessment? No apparent visual deficits  Additional Comments Pt with one eye closed upon entry. Denied DV. Reports it is a "habit" from an injury several years ago  Perception  Perception Tested? No  Praxis  Praxis tested? Not tested  ADL  Overall ADL's  Needs assistance/impaired  Eating/Feeding Independent;Sitting  Grooming Min guard;Standing  Upper Body Bathing Set up;Sitting  Lower Body Bathing Minimal assistance;Sit to/from stand  Upper Body Dressing  Set  up;Sitting  Lower  Body Dressing Sit to/from stand;Moderate assistance  Lower Body Dressing Details (indicate cue type and reason) uses AE at home  Toilet Transfer Min guard;Rolling walker (2 wheels);Regular Radio producer Details (indicate cue type and reason) stood to urinate at toilet; jerky movements noted in standing  Toileting- Clothing Manipulation and Hygiene Min guard  Functional mobility during ADLs Moderate assistance;Rolling walker (2 wheels)  General ADL Comments Limited by chronic pain, generalized weakness and overall poor tolerance. Cues needed for attention. mod A needed for sit<>stands  Bed Mobility  Overal bed mobility Needs Assistance  Bed Mobility Supine to Sit  Supine to sit Min assist  Transfers  Overall transfer level Needs assistance  Equipment used Rolling walker (2 wheels)  Transfers Sit to/from Stand  Sit to Stand Mod assist  General transfer comment modA to rise to standing with cues for hand placement. poor functional power in LE to rise  Balance  Overall balance assessment Needs assistance  Sitting-balance support No upper extremity supported;Feet supported  Sitting balance-Leahy Scale Fair  Sitting balance - Comments initially minA progressed to minG  Standing balance support Bilateral upper extremity supported;During functional activity  Standing balance-Leahy Scale Poor  Standing balance comment dependent on BUE support for gait  General Comments  General comments (skin integrity, edema, etc.) VSS on RA  OT - End of Session  Equipment Utilized During Treatment Gait belt;Rolling walker (2 wheels)  Activity Tolerance Patient tolerated treatment well  Patient left in chair;with call bell/phone within reach;with chair alarm set  Nurse Communication Mobility status  OT Assessment  OT Recommendation/Assessment Patient needs continued OT Services  OT Visit Diagnosis Unsteadiness on feet (R26.81);Other abnormalities of gait and mobility  (R26.89);Muscle weakness (generalized) (M62.81);History of falling (Z91.81);Pain  OT Problem List Decreased strength;Decreased range of motion;Decreased activity tolerance;Impaired balance (sitting and/or standing);Decreased safety awareness;Decreased knowledge of use of DME or AE;Decreased knowledge of precautions;Pain  OT Plan  OT Frequency (ACUTE ONLY) Min 2X/week  OT Treatment/Interventions (ACUTE ONLY) Self-care/ADL training;Therapeutic exercise;DME and/or AE instruction;Therapeutic activities;Patient/family education;Balance training  AM-PAC OT "6 Clicks" Daily Activity Outcome Measure (Version 2)  Help from another person eating meals? 4  Help from another person taking care of personal grooming? 3  Help from another person toileting, which includes using toliet, bedpan, or urinal? 3  Help from another person bathing (including washing, rinsing, drying)? 3  Help from another person to put on and taking off regular upper body clothing? 3  Help from another person to put on and taking off regular lower body clothing? 2  6 Click Score 18  Progressive Mobility  What is the highest level of mobility based on the progressive mobility assessment? Level 5 (Walks with assist in room/hall) - Balance while stepping forward/back and can walk in room with assist - Complete  Activity Ambulated with assistance to bathroom  OT Recommendation  Follow Up Recommendations Home health OT  Assistance recommended at discharge Intermittent Supervision/Assistance  Patient can return home with the following A little help with walking and/or transfers;A little help with bathing/dressing/bathroom;Assistance with cooking/housework;Help with stairs or ramp for entrance;Assist for transportation  Functional Status Assessent Patient has had a recent decline in their functional status and demonstrates the ability to make significant improvements in function in a reasonable and predictable amount of time.  OT Equipment  None recommended by OT  Individuals Consulted  Consulted and Agree with Results and Recommendations Patient  Acute Rehab OT Goals  Patient Stated Goal home  OT Goal Formulation With patient  Time For Goal Achievement 02/01/22  Potential to Achieve Goals Good  OT Time Calculation  OT Start Time (ACUTE ONLY) 1555  OT Stop Time (ACUTE ONLY) 1625  OT Time Calculation (min) 30 min  OT General Charges  $OT Visit 1 Visit

## 2022-01-19 ENCOUNTER — Telehealth: Payer: Self-pay

## 2022-01-19 LAB — GLIADIN ANTIBODIES, SERUM
Antigliadin Abs, IgA: 5 units (ref 0–19)
Gliadin IgG: 1 units (ref 0–19)

## 2022-01-19 LAB — VITAMIN B1: Vitamin B1 (Thiamine): 121.4 nmol/L (ref 66.5–200.0)

## 2022-01-19 LAB — ENDOMYSIAL IGA ANTIBODY: Endomysial Ab, IgA: NEGATIVE

## 2022-01-19 LAB — ANTINUCLEAR ANTIBODIES, IFA: ANA Ab, IFA: NEGATIVE

## 2022-01-19 NOTE — Telephone Encounter (Signed)
Transition Care Management Follow-up Telephone Call Date of discharge and from where: 01/18/2022 Luis Fisher How have you been since you were released from the hospital? Very weak. Using a walker Any questions or concerns? No  Items Reviewed: Did the pt receive and understand the discharge instructions provided? Yes  Medications obtained and verified? Yes  Other? No  Any new allergies since your discharge? No  Dietary orders reviewed? Yes Do you have support at home? Yes   Home Care and Equipment/Supplies: Were home health services ordered? yes If so, what is the name of the agency?   Has the agency set up a time to come to the patient's home? no Were any new equipment or medical supplies ordered?  No What is the name of the medical supply agency?  Were you able to get the supplies/equipment? not applicable Do you have any questions related to the use of the equipment or supplies? No  Functional Questionnaire: (I = Independent and D = Dependent) ADLs: I  Bathing/Dressing- I  Meal Prep- D  Eating- I  Maintaining continence- I, had trouble getting off commode  Transferring/Ambulation- I  Managing Meds- D  Follow up appointments reviewed:  PCP Hospital f/u appt confirmed? No  Stated did not see the reason to set up an appointment with Dr. Legrand Como Are transportation arrangements needed? No  If their condition worsens, is the pt aware to call PCP or go to the Emergency Dept.? Yes Was the patient provided with contact information for the PCP's office or ED? Yes Was to pt encouraged to call back with questions or concerns? Yes

## 2022-01-19 NOTE — TOC CAGE-AID Note (Signed)
Transition of Care [facility]) - CAGE-AID Screening   Patient Details  Name: Luis Fisher MRN: [id number] Date of Birth: December 17, 1955  Transition of Care [facility]) CM/SW Contact:    Ella Bodo, RN Phone Number: 01/19/2022, 9:44 AM   Clinical Narrative: Patient admitted on 01/16/2022 with stroke-likely symptoms, s/p TNK.  Patient admits to drinking 3 drinks nightly, generally bourbon. Patient states he has no issues with his ETOH use, and declines cessation resources.     CAGE-AID Screening:    Have You Ever Felt You Ought to Cut Down on Your Drinking or Drug Use?: No Have People Annoyed You By Critizing Your Drinking Or Drug Use?: No Have You Felt Bad Or Guilty About Your Drinking Or Drug Use?: No Have You Ever Had a Drink or Used Drugs First Thing In The Morning to Steady Your Nerves or to Get Rid of a Hangover?: No CAGE-AID Score: 0  Substance Abuse Education Offered: Yes    Luis Raddle, RN, BSN  Trauma/Neuro ICU Case Manager [phone]

## 2022-01-22 ENCOUNTER — Telehealth: Payer: Self-pay | Admitting: Family Medicine

## 2022-01-22 NOTE — Telephone Encounter (Addendum)
Pt called to inform MD that he recently had a stroke and was issued a monitor and states he has no idea how to hook it up.   Pt has an appointment next week with Dr. Legrand Como for a hospital follow up, but is wondering if he could pop in so that someone can help him connect his machine.    I could not find an opening for Pt today.  Pt states its an external, not internal machine.

## 2022-01-22 NOTE — Telephone Encounter (Signed)
I called the patient and advised he contact the cardiology office at [phone] for instructions regarding the monitor as this would be ordered and followed by cardiology.

## 2022-01-24 LAB — COPPER, SERUM: Copper: 108 ug/dL (ref 69–132)

## 2022-01-26 LAB — HEAVY METALS, BLOOD
Arsenic: 2 ug/L (ref 0–9)
Lead: 1.8 ug/dL (ref 0.0–3.4)
Mercury: <1 ug/L (ref 0.0–14.9)

## 2022-01-27 ENCOUNTER — Encounter: Payer: Self-pay | Admitting: Family Medicine

## 2022-01-27 ENCOUNTER — Inpatient Hospital Stay: Payer: Medicare Other | Admitting: Family Medicine

## 2022-01-27 ENCOUNTER — Ambulatory Visit: Payer: Medicare Other | Attending: Cardiology

## 2022-01-27 ENCOUNTER — Ambulatory Visit (INDEPENDENT_AMBULATORY_CARE_PROVIDER_SITE_OTHER): Payer: Medicare Other | Admitting: Family Medicine

## 2022-01-27 ENCOUNTER — Encounter: Payer: Self-pay | Admitting: Cardiovascular Disease

## 2022-01-27 ENCOUNTER — Ambulatory Visit: Payer: Medicare Other | Attending: Cardiovascular Disease | Admitting: Cardiovascular Disease

## 2022-01-27 VITALS — BP 98/56 | HR 71 | Ht 68.0 in | Wt 215.8 lb

## 2022-01-27 VITALS — BP 136/88 | HR 79 | Temp 98.3°F | Ht 68.0 in | Wt 215.7 lb

## 2022-01-27 DIAGNOSIS — I10 Essential (primary) hypertension: Secondary | ICD-10-CM | POA: Diagnosis not present

## 2022-01-27 DIAGNOSIS — G459 Transient cerebral ischemic attack, unspecified: Secondary | ICD-10-CM

## 2022-01-27 DIAGNOSIS — K118 Other diseases of salivary glands: Secondary | ICD-10-CM

## 2022-01-27 DIAGNOSIS — I4891 Unspecified atrial fibrillation: Secondary | ICD-10-CM | POA: Diagnosis not present

## 2022-01-27 DIAGNOSIS — E7800 Pure hypercholesterolemia, unspecified: Secondary | ICD-10-CM | POA: Insufficient documentation

## 2022-01-27 DIAGNOSIS — R002 Palpitations: Secondary | ICD-10-CM | POA: Diagnosis not present

## 2022-01-27 DIAGNOSIS — M4810 Ankylosing hyperostosis [Forestier], site unspecified: Secondary | ICD-10-CM | POA: Insufficient documentation

## 2022-01-27 DIAGNOSIS — R400 Somnolence: Secondary | ICD-10-CM

## 2022-01-27 NOTE — Patient Instructions (Signed)
Medication Instructions:  No changes *If you need a refill on your cardiac medications before your next appointment, please call your pharmacy*   Lab Work: None ordered If you have labs (blood work) drawn today and your tests are completely normal, you will receive your results only by: [place] (if you have MyChart) OR A paper copy in the mail If you have any lab test that is abnormal or we need to change your treatment, we will call you to review the results.   Testing/Procedures: None ordered   Follow-Up: At [facility], you and your health needs are our priority.  As part of our continuing mission to provide you with exceptional heart care, we have created designated Provider Care Teams.  These Care Teams include your primary Cardiologist (physician) and Advanced Practice Providers (APPs -  Physician Assistants and Nurse Practitioners) who all work together to provide you with the care you need, when you need it.  We recommend signing up for the patient portal called "MyChart".  Sign up information is provided on this After Visit Summary.  MyChart is used to connect with patients for Virtual Visits (Telemedicine).  Patients are able to view lab/test results, encounter notes, upcoming appointments, etc.  Non-urgent messages can be sent to your provider as well.   To learn more about what you can do with MyChart, go to [email or web address].    Your next appointment:   2 month(s)  The format for your next appointment:   In Person  Provider:   Sanda Klein, MD     Important Information About Sugar

## 2022-01-27 NOTE — Patient Instructions (Addendum)
Start melatonin at bedtime, start with 5 mg at night, may take a maximum of 20 mg, avoid taking naps during the day.   Call our office in 2 weeks if you do not hear anything about the ENT or pulmonary referrals.

## 2022-01-27 NOTE — Progress Notes (Signed)
Established Patient Office Visit  Subjective   Patient ID: Luis Fisher, male    DOB: 1955-09-03  Age: 66 y.o. MRN: [id number]  Chief Complaint  Patient presents with   Hospitalization Follow-up    Patient is here for hospital follow up. States that his presenting complaint was stroke like symptoms, slurred speech and right facial droop. He was admitted for 3 days and had an extensive work up. He was given TPA in the hospital due to this symptoms and he met criteria. He has follow up appointments with the neurologist and the cardiologist. He was given 3 weeks of plavix and aspirin 81 mg daily, and thiamine and crestor were started. Patient has an allergy listed to atorvastatin, states it caused mental fog and muscle cramps. I advised him to watch out for these symptoms while on the crestor. If he experiences these symptoms then to call me. He also reports that his alcohol use was exaggerated in the hospital, states he only has 2 drinks in the evening and only sometime has more than that on a weekend.   Since leaving the hospital the patient has had difficulty sleeping, we did review his urine drug screen- pt states the UDS was done after he was given benzo's in the hospital, also he reports that he does use CBD oil and delta 8 vape, occasional marijuana use to help him sleep and for his chronic pain.    Current Outpatient Medications  Medication Instructions   allopurinol (ZYLOPRIM) 100 mg, Oral, Daily   amLODipine (NORVASC) 10 mg, Oral, Daily   Aspirin Low Dose 81 mg, Oral, Daily, Swallow whole.   Cholecalciferol (D3 PO) 1 capsule, Oral, Daily   clopidogrel (PLAVIX) 75 mg, Oral, Daily   cyanocobalamin (VITAMIN B12) 250 mcg, Oral, Daily   fluticasone (FLONASE) 50 MCG/ACT nasal spray USE 1-2 SPRAYS IN EACH NOSTRIL DAILY AS NEEDED.   folic acid (FOLVITE) 1 mg, Oral, Daily   HYDROcodone-acetaminophen (NORCO) 5-325 MG tablet 1 tablet, Oral, 2 times daily PRN   lidocaine (LIDODERM) 5 % APPLY 1  PATCH TO THE SKIN DAILY. REMOVE & DISCARD PATCH WITHIN 12 HOURS OR AS DIRECTED BY MD.   meloxicam (MOBIC) 7.5 mg, Oral, Daily PRN   metoprolol tartrate (LOPRESSOR) 100 mg, Oral, 2 times daily   Multiple Vitamin (MULTIVITAMIN WITH MINERALS) TABS tablet 1 tablet, Oral, Daily   predniSONE (DELTASONE) 20 MG tablet TAKE ONE TABLET BY MOUTH TWICE DAILY AS NEEDED FOR ARTHRITIS FLARE   rosuvastatin (CRESTOR) 20 mg, Oral, Daily   thiamine (VITAMIN B1) 100 mg, Oral, Daily   traMADol (ULTRAM) 50 MG tablet TAKE ONE OR TWO TABLETS BY MOUTH EVERY SIX HOURS AS NEEDED FOR MODERATE PAIN    Patient Active Problem List   Diagnosis Date Noted   Hyperlipidemia 01/18/2022   ETOH abuse 01/18/2022   TIA (transient ischemic attack) 01/16/2022   Prostate cancer ([place]) 02/16/2016   Nephrolithiasis 06/24/2015   History of diverticulitis 06/24/2015   Postoperative anemia due to acute blood loss 01/02/2013   OA (osteoarthritis) of hip 01/01/2013   Testosterone deficiency 06/18/2009   DEPRESSION 02/01/2008   DISH (diffuse idiopathic skeletal hyperostosis) 06/16/2007   History of colonic polyps 06/16/2007   Essential hypertension 01/16/2007      Review of Systems  All other systems reviewed and are negative.     Objective:     BP 136/88 (BP Location: Left Arm, Patient Position: Sitting, Cuff Size: Large)   Pulse 79   Temp 98.3 F (36.8 C) (  Oral)   Ht '5\' 8"'  (1.727 m)   Wt 215 lb 11.2 oz (97.8 kg)   SpO2 99%   BMI 32.80 kg/m    Physical Exam Vitals reviewed.  Constitutional:      Appearance: Normal appearance. He is well-groomed. He is obese.  Eyes:     Extraocular Movements: Extraocular movements intact.     Conjunctiva/sclera: Conjunctivae normal.     Pupils: Pupils are equal, round, and reactive to light.  Cardiovascular:     Rate and Rhythm: Normal rate and regular rhythm.     Pulses: Normal pulses.     Heart sounds: S1 normal and S2 normal. No murmur heard. Pulmonary:     Effort:  Pulmonary effort is normal.     Breath sounds: Normal breath sounds and air entry. No rales.  Neurological:     General: No focal deficit present.     Mental Status: He is alert and oriented to person, place, and time. Mental status is at baseline.     Gait: Gait is intact.  Psychiatric:        Mood and Affect: Mood and affect normal.      No results found for any visits on 01/27/22.  Last lipids Lab Results  Component Value Date   CHOL 212 (H) 01/16/2022   HDL 46 01/16/2022   LDLCALC 128 (H) 01/16/2022   LDLDIRECT 125.0 05/22/2021   TRIG 191 (H) 01/16/2022   CHOLHDL 4.6 01/16/2022      The ASCVD Risk score (Arnett DK, et al., 2019) failed to calculate for the following reasons:   The patient has a prior MI or stroke diagnosis    Assessment & Plan:   Problem List Items Addressed This Visit       Cardiovascular and Mediastinum   TIA (transient ischemic attack)    S/p hospitalization and TPA administration, repeat MRI was negative, continue the plavix for the 3 weeks, then continue aspirin only for secondary prevention. Patient has a history of adverse reaction to atorvastatin in the past, I recommended he monitor himself for any symptoms and if he starts to have these he should call me to discuss.      Other Visit Diagnoses     Mass of left parotid gland    -  Primary   Relevant Orders   Ambulatory referral to ENT -- Left parotid lesion seen incidentally on MRI, will send to ENT as recommended for further evaluation.   Daytime somnolence       Relevant Orders   Ambulatory referral to Pulmonology - There are many symptoms of OSA present, I recommended referral for sleep study. He will follow up with me in 3 months after he sees the specialists.      I spent 47 minutes with this patient reviewing his discharge summary, discussing his MRI, other symptoms since leaving the hospital, etc. Return in about 3 months (around 04/29/2022) for end of january for the 6 month  follow up- pt will be in [place] in february.    Farrel Conners, MD
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
## 2022-01-27 NOTE — Assessment & Plan Note (Addendum)
S/p hospitalization and TPA administration, repeat MRI was negative, continue the plavix for the 3 weeks, then continue aspirin only for secondary prevention. Patient has a history of adverse reaction to atorvastatin in the past, I recommended he monitor himself for any symptoms and if he starts to have these he should call me to discuss.

## 2022-02-08 ENCOUNTER — Encounter: Payer: Self-pay | Admitting: Adult Health

## 2022-02-08 ENCOUNTER — Ambulatory Visit (INDEPENDENT_AMBULATORY_CARE_PROVIDER_SITE_OTHER): Payer: Medicare Other | Admitting: Adult Health

## 2022-02-08 VITALS — BP 122/78 | HR 80 | Temp 98.8°F | Ht 68.0 in | Wt 216.8 lb

## 2022-02-08 DIAGNOSIS — R0683 Snoring: Secondary | ICD-10-CM

## 2022-02-08 DIAGNOSIS — G4700 Insomnia, unspecified: Secondary | ICD-10-CM | POA: Diagnosis not present

## 2022-02-08 NOTE — Patient Instructions (Signed)
Set up for Home sleep study  May try Melatonin At bedtime  for insomnia.  Healthy sleep regimen  Work on healthy sleep regimen .  Follow up in 2 months to discuss results and treatment plan.

## 2022-02-08 NOTE — Progress Notes (Signed)
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         3.60 cm   LV e' medial:    4.90 cm/s LV PW:         1.20 cm   LV E/e' medial:  11.0 LV IVS:        1.30 cm   LV e' lateral:   5.87 cm/s LVOT diam:     2.00 cm   LV E/e' lateral: 9.2 LV SV:         64 LV SV Index:   30 LVOT Area:     3.14 cm  RIGHT VENTRICLE RV Basal diam:  3.20 cm RV S prime:     11.00 cm/s TAPSE (M-mode): 1.8 cm LEFT ATRIUM           Index        RIGHT ATRIUM           Index LA diam:      3.70 cm 1.72 cm/m   RA Area:     16.00 cm LA Vol (A2C): 64.7 ml 30.14 ml/m  RA Volume:   33.40 ml  15.56 ml/m LA Vol (A4C): 59.7 ml 27.81 ml/m  AORTIC VALVE AV Area (Vmax):    2.43 cm AV Area (Vmean):   2.35 cm AV Area (VTI):     2.47 cm AV Vmax:           159.00 cm/s AV Vmean:          102.000 cm/s AV VTI:            0.258 m AV Peak Grad:      10.1 mmHg AV Mean Grad:      5.0 mmHg LVOT Vmax:         123.00 cm/s LVOT Vmean:        76.400 cm/s LVOT VTI:          0.203 m LVOT/AV VTI ratio: 0.79  AORTA Ao Asc diam: 3.50 cm MITRAL VALVE MV Area (PHT): 4.06 cm     SHUNTS MV Decel Time: 187 msec    Systemic VTI:  0.20 m MV E velocity: 53.80 cm/s  Systemic Diam: 2.00 cm MV A velocity: 92.70 cm/s MV E/A ratio:  0.58 Rozann Lesches MD Electronically signed by Rozann Lesches MD Signature Date/Time: 01/16/2022/2:46:50 PM    Final    CT HEAD CODE STROKE WO CONTRAST  Result Date: 01/16/2022 CLINICAL DATA:  Code stroke. Neuro deficit, acute, stroke suspected. EXAM: CT HEAD WITHOUT CONTRAST TECHNIQUE: Contiguous axial images were obtained from the base of the skull through the vertex without intravenous contrast. RADIATION DOSE REDUCTION: This exam was performed according to the departmental dose-optimization program which includes automated exposure control, adjustment of the mA and/or kV according to patient size and/or use of iterative reconstruction technique. COMPARISON:  No pertinent prior exams available for comparison. FINDINGS: Motion degraded examination. Brain: Mild generalized cerebral atrophy. Minimal patchy and ill-defined hypoattenuation within the cerebral white matter, nonspecific but compatible with chronic small vessel ischemic  disease. There is no acute intracranial hemorrhage. No demarcated cortical infarct. No extra-axial fluid collection. No evidence of an intracranial mass. No midline shift. Vascular: No hyperdense vessel.  Atherosclerotic calcifications. Skull: No fracture or aggressive osseous lesion. Sinuses/Orbits: No mass or acute finding within the imaged orbits. No significant paranasal sinus disease at the imaged levels. ASPECTS ([place] Stroke Program Early CT Score) - Ganglionic level infarction (caudate, lentiform nuclei, internal capsule, insula, M1-M3 cortex): 7 - Supraganglionic infarction (M4-M6 cortex): 3 Total score (0-10 with 10 being normal): 10 These results were communicated to Dr. Lorrin Goodell At 11:42 amon 9/30/2023by text page via the [facility] messaging system. IMPRESSION: No evidence of acute intracranial abnormality. Minimal chronic  small-vessel ischemic changes within the cerebral white matter. Mild generalized cerebral atrophy. Electronically Signed   By: Kellie Simmering D.O.   On: 01/16/2022 11:45          No data to display          No results found for: "NITRICOXIDE"      Assessment & Plan:   No problem-specific Assessment & Plan notes found for this encounter.     Rexene Edison, NP 02/08/2022

## 2022-02-09 DIAGNOSIS — R0683 Snoring: Secondary | ICD-10-CM | POA: Insufficient documentation

## 2022-02-09 DIAGNOSIS — G4700 Insomnia, unspecified: Secondary | ICD-10-CM | POA: Insufficient documentation

## 2022-02-09 NOTE — Assessment & Plan Note (Signed)
Healthy sleep regimen discussed in detail.  Try melatonin at bedtime.

## 2022-02-09 NOTE — Assessment & Plan Note (Signed)
Snoring, daytime sleepiness, restless sleep all concerning for underlying sleep apnea. We did gust setting up for an in lab study with his recent hospitalization for possible stroke symptoms.  Patient says he just does not feel like he can sleep in lab has very fragmented sleep and will get a better night sleep at home.  We will set up for home sleep study.  - discussed how weight can impact sleep and risk for sleep disordered breathing - discussed options to assist with weight loss: combination of diet modification, cardiovascular and strength training exercises   - had an extensive discussion regarding the adverse health consequences related to untreated sleep disordered breathing - specifically discussed the risks for hypertension, coronary artery disease, cardiac dysrhythmias, cerebrovascular disease, and diabetes - lifestyle modification discussed   - discussed how sleep disruption can increase risk of accidents, particularly when driving - safe driving practices were discussed  Plan Patient Instructions  Set up for Home sleep study  May try Melatonin At bedtime  for insomnia.  Healthy sleep regimen  Work on healthy sleep regimen .  Follow up in 2 months to discuss results and treatment plan.

## 2022-02-09 NOTE — Progress Notes (Signed)
Reviewed and agree with assessment/plan.   Chesley Mires, MD [facility] Pulmonary/Critical Care 02/09/2022, 3:06 PM Pager:  [phone]

## 2022-02-15 ENCOUNTER — Ambulatory Visit (INDEPENDENT_AMBULATORY_CARE_PROVIDER_SITE_OTHER): Payer: Medicare Other | Admitting: Neurology

## 2022-02-15 ENCOUNTER — Encounter: Payer: Self-pay | Admitting: Neurology

## 2022-02-15 ENCOUNTER — Ambulatory Visit: Payer: Medicare Other | Admitting: Internal Medicine

## 2022-02-15 VITALS — BP 128/80 | HR 80 | Ht 68.0 in | Wt 219.0 lb

## 2022-02-15 DIAGNOSIS — I639 Cerebral infarction, unspecified: Secondary | ICD-10-CM

## 2022-02-15 DIAGNOSIS — G8191 Hemiplegia, unspecified affecting right dominant side: Secondary | ICD-10-CM | POA: Diagnosis not present

## 2022-02-15 MED ORDER — ROSUVASTATIN CALCIUM 5 MG PO TABS: 5.0000 mg | ORAL_TABLET | Freq: Every day | ORAL | 11 refills | Status: DC

## 2022-02-15 NOTE — Patient Instructions (Signed)
I had a long d/w patient and his wife about his recent stroke like episode, risk for recurrent stroke/TIAs, personally independently reviewed imaging studies and stroke evaluation results and answered questions.Continue aspirin 81 mg daily  for secondary stroke prevention and maintain strict control of hypertension with blood pressure goal below 130/90, diabetes with hemoglobin A1c goal below 6.5% and lipids with LDL cholesterol goal below 70 mg/dL. I also advised the patient to eat a healthy diet with plenty of whole grains, cereals, fruits and vegetables, exercise regularly and maintain ideal body weight I counseled him to restart Crestor 5 mg daily.  Lipid.  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  Followup in the future with my nurse practitioner in 6 months or call earlier if necessary.   Stroke Prevention Some medical conditions and behaviors can lead to a higher chance of having a stroke. You can help prevent a stroke by eating healthy, exercising, not smoking, and managing any medical conditions you have. Stroke is a leading cause of functional impairment. Primary prevention is particularly important because a majority of strokes are first-time events. Stroke changes the lives of not only those who experience a stroke but also their family and other caregivers. How can this condition affect me? A stroke is a medical emergency and should be treated right away. A stroke can lead to brain damage and can sometimes be life-threatening. If a person gets medical treatment right away, there is a better chance of surviving and recovering from a stroke. What can increase my risk? The following medical conditions may increase your risk of a stroke: Cardiovascular disease. High blood pressure (hypertension). Diabetes. High cholesterol. Sickle cell disease. Blood clotting disorders (hypercoagulable state). Obesity. Sleep  disorders (obstructive sleep apnea). Other risk factors include: Being older than age 36. Having a history of blood clots, stroke, or mini-stroke (transient ischemic attack, TIA). Genetic factors, such as race, ethnicity, or a family history of stroke. Smoking cigarettes or using other tobacco products. Taking birth control pills, especially if you also use tobacco. Heavy use of alcohol or drugs, especially cocaine and methamphetamine. Physical inactivity. What actions can I take to prevent this? Manage your health conditions High cholesterol levels. Eating a healthy diet is important for preventing high cholesterol. If cholesterol cannot be managed through diet alone, you may need to take medicines. Take any prescribed medicines to control your cholesterol as told by your health care provider. Hypertension. To reduce your risk of stroke, try to keep your blood pressure below 130/80. Eating a healthy diet and exercising regularly are important for controlling blood pressure. If these steps are not enough to manage your blood pressure, you may need to take medicines. Take any prescribed medicines to control hypertension as told by your health care provider. Ask your health care provider if you should monitor your blood pressure at home. Have your blood pressure checked every year, even if your blood pressure is normal. Blood pressure increases with age and some medical conditions. Diabetes. Eating a healthy diet and exercising regularly are important parts of managing your blood sugar (glucose). If your blood sugar cannot be managed through diet and exercise, you may need to take medicines. Take any prescribed medicines to control your diabetes as told by your health care provider. Get evaluated for obstructive sleep apnea. Talk to your health care provider about getting a sleep evaluation if you snore a lot or have excessive sleepiness. Make  sure that any other medical conditions you have,  such as atrial fibrillation or atherosclerosis, are managed. Nutrition Follow instructions from your health care provider about what to eat or drink to help manage your health condition. These instructions may include: Reducing your daily calorie intake. Limiting how much salt (sodium) you use to 1,500 milligrams (mg) each day. Using only healthy fats for cooking, such as olive oil, canola oil, or sunflower oil. Eating healthy foods. You can do this by: Choosing foods that are high in fiber, such as whole grains, and fresh fruits and vegetables. Eating at least 5 servings of fruits and vegetables a day. Try to fill one-half of your plate with fruits and vegetables at each meal. Choosing lean protein foods, such as lean cuts of meat, poultry without skin, fish, tofu, beans, and nuts. Eating low-fat dairy products. Avoiding foods that are high in sodium. This can help lower blood pressure. Avoiding foods that have saturated fat, trans fat, and cholesterol. This can help prevent high cholesterol. Avoiding processed and prepared foods. Counting your daily carbohydrate intake.  Lifestyle If you drink alcohol: Limit how much you have to: 0-1 drink a day for women who are not pregnant. 0-2 drinks a day for men. Know how much alcohol is in your drink. In the [place] one drink equals one 12 oz bottle of beer (39m), one 5 oz glass of wine (1431m, or one 1 oz glass of hard liquor (4451m Do not use any products that contain nicotine or tobacco. These products include cigarettes, chewing tobacco, and vaping devices, such as e-cigarettes. If you need help quitting, ask your health care provider. Avoid secondhand smoke. Do not use drugs. Activity  Try to stay at a healthy weight. Get at least 30 minutes of exercise on most days, such as: Fast walking. Biking. Swimming. Medicines Take over-the-counter and prescription medicines only as told by your health care provider. Aspirin or blood thinners  (antiplatelets or anticoagulants) may be recommended to reduce your risk of forming blood clots that can lead to stroke. Avoid taking birth control pills. Talk to your health care provider about the risks of taking birth control pills if: You are over 35 45ars old. You smoke. You get very bad headaches. You have had a blood clot. Where to find more information American Stroke Association: www.strokeassociation.org Get help right away if: You or a loved one has any symptoms of a stroke. "BE FAST" is an easy way to remember the main warning signs of a stroke: B - Balance. Signs are dizziness, sudden trouble walking, or loss of balance. E - Eyes. Signs are trouble seeing or a sudden change in vision. F - Face. Signs are sudden weakness or numbness of the face, or the face or eyelid drooping on one side. A - Arms. Signs are weakness or numbness in an arm. This happens suddenly and usually on one side of the body. S - Speech. Signs are sudden trouble speaking, slurred speech, or trouble understanding what people say. T - Time. Time to call emergency services. Write down what time symptoms started. You or a loved one has other signs of a stroke, such as: A sudden, severe headache with no known cause. Nausea or vomiting. Seizure. These symptoms may represent a serious problem that is an emergency. Do not wait to see if the symptoms will go away. Get medical help right away. Call your local emergency services (911 in the [place] Do not drive yourself to the hospital. Summary You can  help to prevent a stroke by eating healthy, exercising, not smoking, limiting alcohol intake, and managing any medical conditions you may have. Do not use any products that contain nicotine or tobacco. These include cigarettes, chewing tobacco, and vaping devices, such as e-cigarettes. If you need help quitting, ask your health care provider. Remember "BE FAST" for warning signs of a stroke. Get help right away if you or a  loved one has any of these signs. This information is not intended to replace advice given to you by your health care provider. Make sure you discuss any questions you have with your health care provider. Document Revised: 11/05/2019 Document Reviewed: 11/05/2019 Elsevier Patient Education  [place].
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
## 2022-02-17 ENCOUNTER — Inpatient Hospital Stay: Payer: Self-pay | Admitting: Diagnostic Neuroimaging

## 2022-03-03 ENCOUNTER — Telehealth: Payer: Self-pay

## 2022-03-03 DIAGNOSIS — G459 Transient cerebral ischemic attack, unspecified: Secondary | ICD-10-CM

## 2022-03-03 NOTE — Telephone Encounter (Signed)
The patient has been notified of the result and verbalized understanding.  All questions (if any) were answered. Precious Gilding, RN 03/03/2022 2:09 PM   Pt wants to know if Dr. Sallyanne Kuster will be implant loop recorder at 03/30/22 OV.  Advised pt will send to nurse to follow up. Placed order for EP referral.

## 2022-03-03 NOTE — Telephone Encounter (Signed)
-----   Message from Sueanne Margarita, MD sent at 03/02/2022  1:24 PM EST ----- No significant atrial arrhythmias.  There was one 6 beat run of nonsustained VT.  This event monitor was done for stroke.  Please refer to EP for loop recorder implantation

## 2022-03-04 NOTE — Telephone Encounter (Signed)
I cannot do on 12/12. Can we please re-schedule him for a loop recorder implantation on 12/04 or on 12/18? And do all the precert stuff, please.

## 2022-03-04 NOTE — Telephone Encounter (Signed)
Patient rescheduled for loop implant on 12/18

## 2022-03-05 ENCOUNTER — Other Ambulatory Visit: Payer: Self-pay | Admitting: Family Medicine

## 2022-03-05 ENCOUNTER — Ambulatory Visit: Payer: Medicare Other | Admitting: Family Medicine

## 2022-03-05 DIAGNOSIS — I4729 Other ventricular tachycardia: Secondary | ICD-10-CM

## 2022-03-09 ENCOUNTER — Encounter: Payer: Self-pay | Admitting: *Deleted

## 2022-03-10 ENCOUNTER — Encounter: Payer: Self-pay | Admitting: Family Medicine

## 2022-03-10 ENCOUNTER — Ambulatory Visit (INDEPENDENT_AMBULATORY_CARE_PROVIDER_SITE_OTHER): Payer: Medicare Other | Admitting: Family Medicine

## 2022-03-10 VITALS — BP 124/70 | HR 104 | Temp 99.1°F | Ht 68.0 in | Wt 221.0 lb

## 2022-03-10 DIAGNOSIS — I639 Cerebral infarction, unspecified: Secondary | ICD-10-CM | POA: Insufficient documentation

## 2022-03-10 DIAGNOSIS — R2689 Other abnormalities of gait and mobility: Secondary | ICD-10-CM | POA: Diagnosis not present

## 2022-03-10 DIAGNOSIS — M4810 Ankylosing hyperostosis [Forestier], site unspecified: Secondary | ICD-10-CM

## 2022-03-10 DIAGNOSIS — I69359 Hemiplegia and hemiparesis following cerebral infarction affecting unspecified side: Secondary | ICD-10-CM | POA: Diagnosis not present

## 2022-03-10 DIAGNOSIS — I69328 Other speech and language deficits following cerebral infarction: Secondary | ICD-10-CM

## 2022-03-10 DIAGNOSIS — I69319 Unspecified symptoms and signs involving cognitive functions following cerebral infarction: Secondary | ICD-10-CM | POA: Diagnosis not present

## 2022-03-10 NOTE — Assessment & Plan Note (Signed)
Patient needs a power wheelchair to perform MRADLs in the home due to the other listed diagnoses on this note. Patient's mobility evaluation was performed in office today. I have reviewed and filled out the orders and evaluation for the power wheelchair.

## 2022-03-10 NOTE — Progress Notes (Unsigned)
Established Patient Office Visit  Subjective   Patient ID: Luis Fisher, male    DOB: 03-18-1956  Age: 66 y.o. MRN: [id number]  Chief Complaint  Patient presents with  . Patient requests paperwork be completed for wheelchair    Patient is here today for his face to face encounter for mobility exam/ power wheelchair evaluation.  Patient has a history of DISH and CVA in the past. States that when he gets flare ups of his DISH that he has great difficulty with mobility and ambulation. Pt also has a history of memory trouble from his CVA. He reports that he is using walkers and canes at home, however a lot of his debility comes from acute pain flare ups from the DISH. At those times he is relatively incpacitated. States that he is at least a 1 person assist during his flare ups of his DISH.  Continues to take the tramadol 1-2 tablets every 6 hours as needed for pain but reports that they are only minimally effective for treatment of his pain.   Current Outpatient Medications  Medication Instructions  . allopurinol (ZYLOPRIM) 100 mg, Oral, Daily  . amLODipine (NORVASC) 10 mg, Oral, Daily  . Aspirin Low Dose 81 mg, Oral, Daily, Swallow whole.  . Cholecalciferol (D3 PO) 1 capsule, Oral, Daily  . co-enzyme Q-10 30 mg, Oral, Weekly, 3-4 times per week.  . cyanocobalamin (VITAMIN B12) 250 mcg, Oral, Daily  . fluticasone (FLONASE) 50 MCG/ACT nasal spray USE 1-2 SPRAYS IN EACH NOSTRIL DAILY AS NEEDED.  [name] HYDROcodone-acetaminophen (NORCO) 5-325 MG tablet 1 tablet, Oral, 2 times daily PRN  . lidocaine (LIDODERM) 5 % APPLY 1 PATCH TO THE SKIN DAILY. REMOVE & DISCARD PATCH WITHIN 12 HOURS OR AS DIRECTED BY MD.  . meloxicam (MOBIC) 7.5 mg, Oral, Daily PRN  . metoprolol tartrate (LOPRESSOR) 100 mg, Oral, 2 times daily  . Multiple Vitamin (MULTIVITAMIN WITH MINERALS) TABS tablet 1 tablet, Oral, Daily  . predniSONE (DELTASONE) 20 MG tablet TAKE ONE TABLET BY MOUTH TWICE DAILY AS NEEDED FOR ARTHRITIS FLARE   . rosuvastatin (CRESTOR) 5 mg, Oral, Daily  . traMADol (ULTRAM) 50 MG tablet TAKE ONE OR TWO TABLETS BY MOUTH EVERY SIX HOURS AS NEEDED FOR MODERATE PAIN     Patient Active Problem List   Diagnosis Date Noted  . CVA (cerebral vascular accident) ([place]) 03/10/2022  . Hemiparesis, speech and language deficits, and cognitive deficits due to recent cerebral infarction ([place]) 03/10/2022  . Decreased functional mobility 03/10/2022  . Snoring 02/09/2022  . Insomnia 02/09/2022  . Hyperlipidemia 01/18/2022  . ETOH abuse 01/18/2022  . TIA (transient ischemic attack) 01/16/2022  . Prostate cancer ([place]) 02/16/2016  . Nephrolithiasis 06/24/2015  . History of diverticulitis 06/24/2015  . Postoperative anemia due to acute blood loss 01/02/2013  . OA (osteoarthritis) of hip 01/01/2013  . Testosterone deficiency 06/18/2009  . DEPRESSION 02/01/2008  . DISH (diffuse idiopathic skeletal hyperostosis) 06/16/2007  . History of colonic polyps 06/16/2007  . Essential hypertension 01/16/2007      Review of Systems  All other systems reviewed and are negative.     Objective:     BP 124/70 (BP Location: Left Arm, Patient Position: Sitting, Cuff Size: Large)   Pulse (!) 104   Temp 99.1 F (37.3 C) (Oral)   Ht '5\' 8"'$  (1.727 m)   Wt 221 lb (100.2 kg)   SpO2 97%   BMI 33.60 kg/m  {Vitals History (Optional):23777}  Physical Exam Constitutional:  Appearance: Normal appearance. He is obese.  HENT:     Mouth/Throat:     Mouth: Mucous membranes are moist.     Pharynx: Oropharynx is clear.  Cardiovascular:     Pulses: Normal pulses.     Heart sounds: Normal heart sounds.  Pulmonary:     Effort: Pulmonary effort is normal.     Breath sounds: Normal breath sounds.  Abdominal:     General: Bowel sounds are normal.  Neurological:     General: No focal deficit present.     Mental Status: He is alert and oriented to person, place, and time. Mental status is at baseline.     Cranial Nerves:  Cranial nerves 2-12 are intact.     Sensory: Sensation is intact.     Motor: Tremor present. No atrophy or seizure activity.     Gait: Gait abnormal.     Deep Tendon Reflexes: Reflexes are normal and symmetric.     No results found for any visits on 03/10/22.  {Labs (Optional):23779}  The ASCVD Risk score (Arnett DK, et al., 2019) failed to calculate for the following reasons:   The patient has a prior MI or stroke diagnosis    Assessment & Plan:   Problem List Items Addressed This Visit       Unprioritized   DISH (diffuse idiopathic skeletal hyperostosis)   Hemiparesis, speech and language deficits, and cognitive deficits due to recent cerebral infarction (HCC) (Chronic)   Decreased functional mobility - Primary    Patient needs a power wheelchair to perform MRADLs in the home due to the other listed diagnoses on this note. Patient's mobility evaluation was performed in office today. I have reviewed and filled out the orders and evaluation for the power wheelchair.       No follow-ups on file.    Farrel Conners, MD

## 2022-03-17 ENCOUNTER — Ambulatory Visit: Payer: Medicare Other | Admitting: Cardiology

## 2022-03-19 ENCOUNTER — Telehealth: Payer: Self-pay | Admitting: Family Medicine

## 2022-03-19 NOTE — Telephone Encounter (Signed)
Spoke with Luis Fisher and informed her the paperwork and office note was faxed to [phone] on 11/29.  Luis Fisher requested the paperwork be faxed to [phone] and this was resubmitted with confirmation received.  Paperwork was sent to be scanned.

## 2022-03-19 NOTE — Telephone Encounter (Signed)
Luis Fisher with [employer]  [phone]   Faxed request on 03/09/22 for the following:  Requesting Office notes from 03/10/22, and completed and signed off packet that was faxed on 03/09/22, for Pt's wheelchair  Fax [phone]

## 2022-03-29 ENCOUNTER — Telehealth: Payer: Self-pay | Admitting: Family Medicine

## 2022-03-29 NOTE — Telephone Encounter (Signed)
[facility]  [phone] Ext: [phone]  Following up on the fax sent to [place] on 03/16/22 *Change in order request She is asking that the order be signed & returned as soon as possible.

## 2022-03-29 NOTE — Telephone Encounter (Signed)
I left a detailed message stating I do not recall an order and requested Clarise Cruz re-fax the order she is requesting to [phone] for Dr Legrand Como to review and sign.

## 2022-03-30 ENCOUNTER — Ambulatory Visit: Payer: Medicare Other | Admitting: Cardiovascular Disease

## 2022-03-30 DIAGNOSIS — K118 Other diseases of salivary glands: Secondary | ICD-10-CM | POA: Diagnosis not present

## 2022-03-30 DIAGNOSIS — R438 Other disturbances of smell and taste: Secondary | ICD-10-CM | POA: Diagnosis not present

## 2022-03-30 DIAGNOSIS — Z8673 Personal history of transient ischemic attack (TIA), and cerebral infarction without residual deficits: Secondary | ICD-10-CM | POA: Diagnosis not present

## 2022-03-31 ENCOUNTER — Other Ambulatory Visit (HOSPITAL_COMMUNITY): Payer: Self-pay | Admitting: Otolaryngology

## 2022-03-31 ENCOUNTER — Ambulatory Visit: Payer: Medicare Other

## 2022-03-31 DIAGNOSIS — K118 Other diseases of salivary glands: Secondary | ICD-10-CM

## 2022-03-31 DIAGNOSIS — G4733 Obstructive sleep apnea (adult) (pediatric): Secondary | ICD-10-CM | POA: Diagnosis not present

## 2022-03-31 DIAGNOSIS — R0683 Snoring: Secondary | ICD-10-CM

## 2022-04-01 ENCOUNTER — Encounter: Payer: Self-pay | Admitting: General Practice

## 2022-04-01 ENCOUNTER — Ambulatory Visit (HOSPITAL_BASED_OUTPATIENT_CLINIC_OR_DEPARTMENT_OTHER)
Admission: RE | Admit: 2022-04-01 | Discharge: 2022-04-01 | Disposition: A | Discharge status: Home / Self Care | Payer: Medicare Other | Source: Ambulatory Visit | Attending: Otolaryngology | Admitting: Otolaryngology

## 2022-04-01 DIAGNOSIS — E041 Nontoxic single thyroid nodule: Secondary | ICD-10-CM | POA: Diagnosis not present

## 2022-04-01 DIAGNOSIS — K118 Other diseases of salivary glands: Secondary | ICD-10-CM | POA: Diagnosis not present

## 2022-04-01 NOTE — Progress Notes (Signed)
Luis Mariscal, MD  Allen Kell, NT OK for US guided left parotid nodule Bx.  Sedation per pt request.  Cathren Harsh       Previous Messages    ----- Message ----- From: Allen Kell, NT Sent: 04/01/2022   3:13 PM EST To: [facility] Procedure Requests Subject: [place] FNA SALIVARY GLAND/PAROTID GLAND            Procedure: [place] FNA SALIVARY GLAND/PAROTID GLAND  Reason: Mass of left parotid gland  History: [place] in chart  Provider: Jenetta Downer, MD  Contact: [phone]  Per Dr. Pascal Lux This nodule could undergo ultrasound-guided biopsy as indicated. Just wanted to put in review to make sure.    Thanks

## 2022-04-02 DIAGNOSIS — G4733 Obstructive sleep apnea (adult) (pediatric): Secondary | ICD-10-CM | POA: Diagnosis not present

## 2022-04-05 ENCOUNTER — Ambulatory Visit: Payer: Medicare Other | Attending: Cardiovascular Disease | Admitting: Cardiovascular Disease

## 2022-04-05 DIAGNOSIS — I639 Cerebral infarction, unspecified: Secondary | ICD-10-CM

## 2022-04-05 DIAGNOSIS — Z95818 Presence of other cardiac implants and grafts: Secondary | ICD-10-CM

## 2022-04-05 MED ORDER — LIDOCAINE-EPINEPHRINE 1 %-1:100000 IJ SOLN
10.0000 mL | Freq: Once | INTRAMUSCULAR | Status: AC
  Administered 2022-04-05: 10 mL

## 2022-04-05 NOTE — Patient Instructions (Addendum)
Discharge Instructions for  Loop Recorder Explant/Implant    Follow up: Keep your wound check appointment on Thursday, January 4th @ 12:00pm.  If you have any questions or concerns, please call the office at [phone].  ACTIVITY No restrictions. DO wear your seatbelt, even if it crosses over the site.   WOUND CARE Keep the wound area clean and dry.  Remove the dressing the day after (usually 24 hours after the procedure). DO NOT SUBMERGE UNDER WATER UNTIL FULLY HEALED (no tub baths, hot tubs, swimming pools, etc.).  You  may shower or take a sponge bath after the dressing is removed. DO NOT SOAK the area and do not allow the shower to directly spray on the site. If you have tape/steri-strips on your wound, these will fall off; do not pull them off prematurely.   No bandage is needed on the site.  DO  NOT apply any creams, oils, or ointments to the wound area. If you notice any drainage or discharge from the wound, any swelling, excessive redness or bruising at the site, or if you develop a fever > 101? F, call the office at once at [phone].

## 2022-04-05 NOTE — Progress Notes (Signed)
Luis Fisher presents today for elective implantation of a loop recorder for cryptogenic stroke. This procedure has been fully reviewed with the patient and written informed consent has been obtained.    LOOP RECORDER IMPLANT   Procedure report  Procedure performed:  Loop recorder implantation   Reason for procedure:  Cryptogenic stroke Procedure performed by:  Sanda Klein, MD  Complications:  None  Estimated blood loss:  <5 mL  Medications administered during procedure:  Lidocaine 1% with 1/10),000 epinephrine 10 mL locally Device details:  Medtronic Reveal Linq model number [id number], serial number [id number] Procedure details:  After the risks and benefits of the procedure were discussed the patient provided informed consent. The patient was prepped and draped in usual sterile fashion. Local anesthesia was administered to an area 2 cm to the left of the sternum in the 4th intercostal space. A cutaneous incision was made using the incision tool. The introducer was then used to create a subcutaneous tunnel and carefully deploy the device. Local pressure was held to ensure hemostasis.  The incision was closed with SteriStrips and a sterile dressing was applied.  R waves 0.30 mV.  Sanda Klein, MD, [facility] [phone] office [phone] pager 04/05/2022 12:47 PM

## 2022-04-06 DIAGNOSIS — H353131 Nonexudative age-related macular degeneration, bilateral, early dry stage: Secondary | ICD-10-CM | POA: Diagnosis not present

## 2022-04-06 DIAGNOSIS — H43813 Vitreous degeneration, bilateral: Secondary | ICD-10-CM | POA: Diagnosis not present

## 2022-04-06 DIAGNOSIS — H5202 Hypermetropia, left eye: Secondary | ICD-10-CM | POA: Diagnosis not present

## 2022-04-08 ENCOUNTER — Telehealth: Payer: Self-pay | Admitting: Adult Health

## 2022-04-08 NOTE — Telephone Encounter (Signed)
Home sleep study March 31, 2022 showed severe sleep with AHI at 33/hour and SpO2 low at 80%  Please set up virtual visit or office visit to discuss sleep study results and go over treatment plan

## 2022-04-08 NOTE — Telephone Encounter (Signed)
Lm x1 for patient.  

## 2022-04-09 NOTE — Telephone Encounter (Signed)
Per patient's chart-appt scheduled 05/14/2022.

## 2022-04-14 ENCOUNTER — Telehealth: Payer: Self-pay | Admitting: Family Medicine

## 2022-04-14 NOTE — Telephone Encounter (Signed)
Ok to send verbal order

## 2022-04-14 NOTE — Telephone Encounter (Signed)
Kandis Cocking. with [facility]  [phone] (Ok to leave a detailed message on this line)  Verbal Order:  OT - Late Recertification

## 2022-04-14 NOTE — Telephone Encounter (Signed)
Left a detailed message on Luis Fisher's voicemail with the approval for orders as below.

## 2022-04-20 ENCOUNTER — Ambulatory Visit (HOSPITAL_COMMUNITY)
Admission: RE | Admit: 2022-04-20 | Discharge: 2022-04-20 | Disposition: A | Discharge status: Home / Self Care | Payer: Medicare Other | Source: Ambulatory Visit | Attending: Otolaryngology | Admitting: Otolaryngology

## 2022-04-20 ENCOUNTER — Ambulatory Visit (HOSPITAL_COMMUNITY): Payer: Medicare Other

## 2022-04-20 DIAGNOSIS — K116 Mucocele of salivary gland: Secondary | ICD-10-CM | POA: Diagnosis not present

## 2022-04-20 DIAGNOSIS — K118 Other diseases of salivary glands: Secondary | ICD-10-CM | POA: Diagnosis not present

## 2022-04-20 MED ORDER — LIDOCAINE HCL 1 % IJ SOLN
INTRAMUSCULAR | Status: AC
  Administered 2022-04-20: 10 mL
  Filled 2022-04-20: qty 20

## 2022-04-20 NOTE — Procedures (Signed)
Interventional Radiology Procedure Note  Procedure:   US guided left parotid cyst/nodule aspiration.  For cytology.    Findings: ~1-2cc of thin yellow fluid  Complications: None  Recommendations:  - Routine wound care - follow up path/cyto  Signed,  Dulcy Fanny. Earleen Newport, DO

## 2022-04-21 LAB — CYTOLOGY - NON PAP

## 2022-04-22 ENCOUNTER — Encounter: Payer: Self-pay | Admitting: Cardiovascular Disease

## 2022-04-22 ENCOUNTER — Ambulatory Visit: Payer: Medicare Other | Attending: Cardiology | Admitting: Cardiovascular Disease

## 2022-04-22 VITALS — HR 96

## 2022-04-22 DIAGNOSIS — Z95818 Presence of other cardiac implants and grafts: Secondary | ICD-10-CM

## 2022-04-22 NOTE — Patient Instructions (Addendum)
Medication Instructions:  NO CHANGES  *If you need a refill on your cardiac medications before your next appointment, please call your pharmacy*    Follow-Up: At [facility], you and your health needs are our priority.  As part of our continuing mission to provide you with exceptional heart care, we have created designated Provider Care Teams.  These Care Teams include your primary Cardiologist (physician) and Advanced Practice Providers (APPs -  Physician Assistants and Nurse Practitioners) who all work together to provide you with the care you need, when you need it.  We recommend signing up for the patient portal called "MyChart".  Sign up information is provided on this After Visit Summary.  MyChart is used to connect with patients for Virtual Visits (Telemedicine).  Patients are able to view lab/test results, encounter notes, upcoming appointments, etc.  Non-urgent messages can be sent to your provider as well.   To learn more about what you can do with MyChart, go to [email or web address].    Your next appointment:    1 year with Dr. Sallyanne Kuster ** call in August for a January 2025 appointment

## 2022-04-22 NOTE — Progress Notes (Signed)
Video visit wound check after loop recorder implantation.  The site has healed well.  The Steri-Strips are already off and there is a well-healed scar.  He has not had any fever or chills, swelling or tenderness, any drainage from the site. Plan remote downloads from his device every 30 days.

## 2022-05-05 ENCOUNTER — Encounter: Payer: Self-pay | Admitting: Adult Health

## 2022-05-05 ENCOUNTER — Ambulatory Visit (INDEPENDENT_AMBULATORY_CARE_PROVIDER_SITE_OTHER): Payer: Medicare Other | Admitting: Adult Health

## 2022-05-05 VITALS — BP 132/64 | HR 83 | Temp 98.0°F | Ht 69.0 in | Wt 226.8 lb

## 2022-05-05 DIAGNOSIS — G4733 Obstructive sleep apnea (adult) (pediatric): Secondary | ICD-10-CM | POA: Insufficient documentation
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
## 2022-05-05 NOTE — Assessment & Plan Note (Signed)
Severe OSA -we discussed in detail his sleep study results.  Went over treatment options.  Due to his degree of sleep apnea and severe symptom burden.  Will begin on CPAP.  Begin auto CPAP 5 to 15 cm H2O.  Patient has significant facial hair.  He wants to try the DreamWear nasal mask. - discussed how weight can impact sleep and risk for sleep disordered breathing - discussed options to assist with weight loss: combination of diet modification, cardiovascular and strength training exercises   - had an extensive discussion regarding the adverse health consequences related to untreated sleep disordered breathing - specifically discussed the risks for hypertension, coronary artery disease, cardiac dysrhythmias, cerebrovascular disease, and diabetes - lifestyle modification discussed   - discussed how sleep disruption can increase risk of accidents, particularly when driving - safe driving practices were discussed   Plan  Patient Instructions  Begin CPAP at bedtime, goal is to wear all night long for at least 6 or more hours May try Dream wear nasal mask.  Healthy sleep regimen  Work on healthy sleep regimen .  Work on healthy weight loss Follow up in 3 months and As needed

## 2022-05-05 NOTE — Addendum Note (Signed)
Addended by: Vanessa Barbara on: 05/05/2022 09:55 AM   Modules accepted: Orders

## 2022-05-05 NOTE — Progress Notes (Signed)
Reviewed and agree with assessment/plan.   Chesley Mires, MD [facility] Pulmonary/Critical Care 05/05/2022, 10:37 AM Pager:  [phone]

## 2022-05-05 NOTE — Patient Instructions (Addendum)
Begin CPAP at bedtime, goal is to wear all night long for at least 6 or more hours May try Dream wear nasal mask.  Healthy sleep regimen  Work on healthy sleep regimen .  Work on healthy weight loss Follow up in 3 months and As needed

## 2022-05-11 ENCOUNTER — Ambulatory Visit: Payer: Medicare Other | Attending: Cardiovascular Disease

## 2022-05-11 DIAGNOSIS — I639 Cerebral infarction, unspecified: Secondary | ICD-10-CM | POA: Diagnosis not present

## 2022-05-11 LAB — CUP PACEART REMOTE DEVICE CHECK
Date Time Interrogation Session: 20240123114051
Implantable Pulse Generator Implant Date: 20231218

## 2022-05-14 ENCOUNTER — Ambulatory Visit: Payer: Medicare Other | Admitting: Adult Health

## 2022-05-14 ENCOUNTER — Encounter: Payer: Medicare Other | Admitting: Family Medicine

## 2022-05-14 ENCOUNTER — Ambulatory Visit: Payer: Medicare Other | Admitting: Primary Care

## 2022-06-08 NOTE — Progress Notes (Signed)
Carelink Summary Report / Loop Recorder 

## 2022-06-14 ENCOUNTER — Ambulatory Visit: Payer: Medicare Other

## 2022-06-14 ENCOUNTER — Encounter: Payer: Medicare Other | Admitting: Family Medicine

## 2022-06-14 DIAGNOSIS — I639 Cerebral infarction, unspecified: Secondary | ICD-10-CM

## 2022-06-15 LAB — CUP PACEART REMOTE DEVICE CHECK
Date Time Interrogation Session: 20240225113913
Implantable Pulse Generator Implant Date: 20231218

## 2022-06-28 DIAGNOSIS — C61 Malignant neoplasm of prostate: Secondary | ICD-10-CM | POA: Diagnosis not present

## 2022-06-28 DIAGNOSIS — E230 Hypopituitarism: Secondary | ICD-10-CM | POA: Diagnosis not present

## 2022-07-05 DIAGNOSIS — Z8546 Personal history of malignant neoplasm of prostate: Secondary | ICD-10-CM | POA: Diagnosis not present

## 2022-07-05 DIAGNOSIS — E230 Hypopituitarism: Secondary | ICD-10-CM | POA: Diagnosis not present

## 2022-07-05 DIAGNOSIS — R351 Nocturia: Secondary | ICD-10-CM | POA: Diagnosis not present

## 2022-07-05 DIAGNOSIS — N5312 Painful ejaculation: Secondary | ICD-10-CM | POA: Diagnosis not present

## 2022-07-19 ENCOUNTER — Ambulatory Visit (INDEPENDENT_AMBULATORY_CARE_PROVIDER_SITE_OTHER): Payer: Medicare Other

## 2022-07-19 DIAGNOSIS — I639 Cerebral infarction, unspecified: Secondary | ICD-10-CM

## 2022-07-20 LAB — CUP PACEART REMOTE DEVICE CHECK
Date Time Interrogation Session: 20240331230835
Implantable Pulse Generator Implant Date: 20231218

## 2022-07-26 NOTE — Progress Notes (Signed)
Carelink Summary Report / Loop Recorder 

## 2022-07-28 ENCOUNTER — Telehealth: Payer: Self-pay | Admitting: Family Medicine

## 2022-07-28 NOTE — Telephone Encounter (Signed)
Contacted Luis Fisher to schedule their annual wellness visit. Patient declined to schedule AWV at this time.  [name] AWV direct phone # [phone]  Patient not interested  do not call
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
## 2022-08-09 NOTE — Patient Instructions (Signed)
Below is our plan:  We will continue to monitor. We will start low dose gabapentin 300mg  at bedtime to see if this helps with hand/foot pain. It may make you groggy. Do not take with tramadol. Talk with your PCP about chronic insomnia. May consider cognitive behavioral therapy with a psychologist or maybe a sleep aid/anti anxiety med.  Please make sure you are staying well hydrated. I recommend 50-60 ounces daily. Well balanced diet and regular exercise encouraged. Consistent sleep schedule with 6-8 hours recommended.   Please continue follow up with care team as directed.   Follow up with me in 6 months   You may receive a survey regarding today's visit. I encourage you to leave honest feed back as I do use this information to improve patient care. Thank you for seeing me today!

## 2022-08-10 ENCOUNTER — Ambulatory Visit (INDEPENDENT_AMBULATORY_CARE_PROVIDER_SITE_OTHER): Payer: Medicare Other | Admitting: Family Medicine

## 2022-08-10 ENCOUNTER — Encounter: Payer: Self-pay | Admitting: Family Medicine

## 2022-08-10 VITALS — BP 123/66 | HR 66 | Ht 68.0 in | Wt 225.6 lb

## 2022-08-10 DIAGNOSIS — R4189 Other symptoms and signs involving cognitive functions and awareness: Secondary | ICD-10-CM

## 2022-08-10 DIAGNOSIS — M79671 Pain in right foot: Secondary | ICD-10-CM

## 2022-08-10 DIAGNOSIS — M79641 Pain in right hand: Secondary | ICD-10-CM

## 2022-08-10 DIAGNOSIS — F5104 Psychophysiologic insomnia: Secondary | ICD-10-CM

## 2022-08-10 DIAGNOSIS — G4733 Obstructive sleep apnea (adult) (pediatric): Secondary | ICD-10-CM

## 2022-08-10 DIAGNOSIS — K118 Other diseases of salivary glands: Secondary | ICD-10-CM | POA: Diagnosis not present

## 2022-08-10 DIAGNOSIS — I639 Cerebral infarction, unspecified: Secondary | ICD-10-CM

## 2022-08-10 MED ORDER — GABAPENTIN 300 MG PO CAPS: 300.0000 mg | ORAL_CAPSULE | Freq: Every day | ORAL | 3 refills | Status: DC

## 2022-08-11 ENCOUNTER — Ambulatory Visit (INDEPENDENT_AMBULATORY_CARE_PROVIDER_SITE_OTHER): Payer: Medicare Other | Admitting: Family Medicine

## 2022-08-11 ENCOUNTER — Encounter: Payer: Self-pay | Admitting: Family Medicine

## 2022-08-11 ENCOUNTER — Other Ambulatory Visit: Payer: Self-pay | Admitting: Otolaryngology

## 2022-08-11 DIAGNOSIS — I10 Essential (primary) hypertension: Secondary | ICD-10-CM

## 2022-08-11 DIAGNOSIS — M4810 Ankylosing hyperostosis [Forestier], site unspecified: Secondary | ICD-10-CM

## 2022-08-11 DIAGNOSIS — E790 Hyperuricemia without signs of inflammatory arthritis and tophaceous disease: Secondary | ICD-10-CM

## 2022-08-11 DIAGNOSIS — K118 Other diseases of salivary glands: Secondary | ICD-10-CM

## 2022-08-11 MED ORDER — METOPROLOL TARTRATE 100 MG PO TABS: 100.0000 mg | ORAL_TABLET | Freq: Two times a day (BID) | ORAL | 1 refills | Status: DC

## 2022-08-11 MED ORDER — AMLODIPINE BESYLATE 10 MG PO TABS: 10.0000 mg | ORAL_TABLET | Freq: Every day | ORAL | 3 refills | Status: DC

## 2022-08-11 MED ORDER — MELOXICAM 7.5 MG PO TABS: 7.5000 mg | ORAL_TABLET | Freq: Every day | ORAL | 1 refills | Status: DC | PRN

## 2022-08-11 MED ORDER — ALLOPURINOL 100 MG PO TABS: 100.0000 mg | ORAL_TABLET | Freq: Every day | ORAL | 3 refills | Status: DC

## 2022-08-11 NOTE — Progress Notes (Signed)
Complete physical exam  Patient: Luis Fisher   DOB: [birth year]   67 y.o. Male  MRN: [id number]  Subjective:    Chief Complaint  Patient presents with   Annual Exam    Luis Fisher is a 67 y.o. male who presents today for a complete physical exam. He reports consuming a general diet. Exercise is limited by neurologic condition(s): DISH. He generally feels well. He reports sleeping was recently diagnosed with OSA and is currently on CPAP for this. He does not have additional problems to discuss today.   Pt reports he did receive his motorized wheelchair, but he can only use it in his house, states the lift would have cost 4000 to install in his car.   HTN -- BP in office performed and is well controlled. He  reports no side effects to the medications, no chest pain, SOB, dizziness or headaches. He has a BP cuff at home and is checking BP regularly, reports they are in the normal range.    DISH-- pt was recently given a script for gabapentin, he reports he still has pain medication at home, has only taken 1 tablet since his last visit.  Most recent fall risk assessment:    08/11/2022   11:19 AM  Fall Risk   Falls in the past year? 0  Number falls in past yr: 0  Injury with Fall? 0  Risk for fall due to : No Fall Risks  Follow up Falls evaluation completed     Most recent depression screenings:    08/11/2022   11:19 AM 01/27/2022    8:05 AM  PHQ 2/9 Scores  PHQ - 2 Score 0 0  PHQ- 9 Score  3      Patient Active Problem List   Diagnosis Date Noted   OSA (obstructive sleep apnea) 05/05/2022   CVA (cerebral vascular accident) 03/10/2022   Hemiparesis, speech and language deficits, and cognitive deficits due to recent cerebral infarction 03/10/2022   Decreased functional mobility 03/10/2022   Snoring 02/09/2022   Insomnia 02/09/2022   Hyperlipidemia 01/18/2022   ETOH abuse 01/18/2022   TIA (transient ischemic attack) 01/16/2022   Prostate cancer 02/16/2016    Nephrolithiasis 06/24/2015   History of diverticulitis 06/24/2015   Postoperative anemia due to acute blood loss 01/02/2013   OA (osteoarthritis) of hip 01/01/2013   Testosterone deficiency 06/18/2009   DEPRESSION 02/01/2008   DISH (diffuse idiopathic skeletal hyperostosis) 06/16/2007   History of colonic polyps 06/16/2007   Essential hypertension 01/16/2007      Patient Care Team: Karie Georges, MD as PCP - General (Family Medicine) Croitoru, Rachelle Hora, MD as PCP - Cardiology (Cardiology)   Outpatient Medications Prior to Visit  Medication Sig   aspirin EC 81 MG tablet Take 1 tablet (81 mg total) by mouth daily. Swallow whole.   Cholecalciferol (D3 PO) Take 1 capsule by mouth daily.   co-enzyme Q-10 30 MG capsule Take 30 mg by mouth once a week. 3-4 times per week.   cyanocobalamin (VITAMIN B12) 250 MCG tablet Take 250 mcg by mouth daily.   fluticasone (FLONASE) 50 MCG/ACT nasal spray USE 1-2 SPRAYS IN EACH NOSTRIL DAILY AS NEEDED. (Patient taking differently: Place 2 sprays into both nostrils daily as needed for allergies.)   gabapentin (NEURONTIN) 300 MG capsule Take 1 capsule (300 mg total) by mouth at bedtime.   HYDROcodone-acetaminophen (NORCO) 5-325 MG tablet Take 1 tablet by mouth 2 (two) times daily as needed for moderate pain or severe pain.  lidocaine (LIDODERM) 5 % APPLY 1 PATCH TO THE SKIN DAILY. REMOVE & DISCARD PATCH WITHIN 12 HOURS OR AS DIRECTED BY MD. (Patient taking differently: Place 1 patch onto the skin daily.)   predniSONE (DELTASONE) 20 MG tablet TAKE ONE TABLET BY MOUTH TWICE DAILY AS NEEDED FOR ARTHRITIS FLARE   rosuvastatin (CRESTOR) 5 MG tablet Take 1 tablet (5 mg total) by mouth daily.   traMADol (ULTRAM) 50 MG tablet TAKE ONE OR TWO TABLETS BY MOUTH EVERY SIX HOURS AS NEEDED FOR MODERATE PAIN (Patient taking differently: Take 50-100 mg by mouth every 6 (six) hours as needed for moderate pain.)   [DISCONTINUED] allopurinol (ZYLOPRIM) 100 MG tablet Take 1  tablet (100 mg total) by mouth daily.   [DISCONTINUED] amLODipine (NORVASC) 10 MG tablet Take 1 tablet (10 mg total) by mouth daily.   [DISCONTINUED] meloxicam (MOBIC) 7.5 MG tablet Take 1 tablet (7.5 mg total) by mouth daily as needed for pain.   [DISCONTINUED] metoprolol tartrate (LOPRESSOR) 100 MG tablet Take 1 tablet (100 mg total) by mouth 2 (two) times daily.   No facility-administered medications prior to visit.    Review of Systems  All other systems reviewed and are negative.      Objective:     BP 118/62 (BP Location: Left Arm, Patient Position: Sitting, Cuff Size: Large)   Pulse 65   Temp 98.2 F (36.8 C) (Oral)   Ht  (1.753 m)   Wt 224 lb 1.6 oz (101.7 kg)   SpO2 99%   BMI 33.09 kg/m    Physical Exam Vitals reviewed.  Constitutional:      Appearance: Normal appearance. He is well-groomed and normal weight.  Eyes:     Extraocular Movements: Extraocular movements intact.     Conjunctiva/sclera: Conjunctivae normal.  Neck:     Thyroid: No thyromegaly.  Cardiovascular:     Rate and Rhythm: Normal rate and regular rhythm.     Heart sounds: S1 normal and S2 normal. No murmur heard. Pulmonary:     Effort: Pulmonary effort is normal.     Breath sounds: Normal breath sounds and air entry. No rales.  Abdominal:     General: Abdomen is flat. Bowel sounds are normal.  Musculoskeletal:     Right lower leg: No edema.     Left lower leg: No edema.  Neurological:     General: No focal deficit present.     Mental Status: He is alert and oriented to person, place, and time.     Gait: Gait is intact.  Psychiatric:        Mood and Affect: Mood and affect normal.      No results found for any visits on 08/11/22.     Assessment & Plan:    Routine Health Maintenance and Physical Exam  Immunization History  Administered Date(s) Administered   Influenza Split 01/21/2011, 02/21/2012   Influenza Whole 02/22/2007, 02/01/2008, 12/31/2009   Influenza,inj,Quad  PF,6+ Mos 04/05/2014, 02/23/2017   Influenza-Unspecified 03/20/2019, 01/17/2021   PFIZER(Purple Top)SARS-COV-2 Vaccination 04/20/2019, 05/21/2019   Pneumococcal Conjugate-13 03/20/2019   Zoster Recombinat (Shingrix) 04/29/2021, 07/28/2021    Health Maintenance  Topic Date Due   DTaP/Tdap/Td (1 - Tdap) Never done   Medicare Annual Wellness (AWV)  10/04/2017   Pneumonia Vaccine 40+ Years old (2 of 2 - PPSV23 or PCV20) 03/19/2020   HEMOGLOBIN A1C  07/17/2022   COVID-19 Vaccine (3 - Pfizer risk series) 08/11/2023 (Originally 06/18/2019)   Diabetic kidney evaluation - Urine ACR  08/11/2027 (Originally 06/13/1973)   INFLUENZA VACCINE  11/18/2022   Diabetic kidney evaluation - eGFR measurement  01/19/2023   Fecal DNA (Cologuard)  10/07/2023   Hepatitis C Screening  Completed   Zoster Vaccines- Shingrix  Completed   HPV VACCINES  Aged Out   FOOT EXAM  Discontinued   OPHTHALMOLOGY EXAM  Discontinued   COLONOSCOPY (Pts 45-70yrs Insurance coverage will need to be confirmed)  Discontinued    Discussed health benefits of physical activity, and encouraged him to engage in regular exercise appropriate for his age and condition.  Problem List Items Addressed This Visit       Unprioritized   Essential hypertension    Current hypertension medications:       Sig   amLODipine (NORVASC) 10 MG tablet Take 1 tablet (10 mg total) by mouth daily.   metoprolol tartrate (LOPRESSOR) 100 MG tablet Take 1 tablet (100 mg total) by mouth 2 (two) times daily.     Bp is well controlled on the above medications, will continue as prescribed. Refilled today      Relevant Medications   amLODipine (NORVASC) 10 MG tablet   metoprolol tartrate (LOPRESSOR) 100 MG tablet   DISH (diffuse idiopathic skeletal hyperostosis)    Refilled his diclofenac 75 mg BID, pt reports he does not require a refill on the other pain medications today. We did discuss the gabapentin, I encouraged him to try the medication to see if it  would help with his pain sx. RTC 6 months       Relevant Medications   meloxicam (MOBIC) 7.5 MG tablet   Other Visit Diagnoses     Elevated uric acid in blood       Relevant Medications   allopurinol (ZYLOPRIM) 100 MG tablet      Return in about 6 months (around 02/10/2023) for HTN -- please come fasting for bloodwork.     Karie Georges, MD

## 2022-08-11 NOTE — Patient Instructions (Signed)
Health Maintenance, Male Adopting a healthy lifestyle and getting preventive care are important in promoting health and wellness. Ask your health care provider about: The right schedule for you to have regular tests and exams. Things you can do on your own to prevent diseases and keep yourself healthy. What should I know about diet, weight, and exercise? Eat a healthy diet  Eat a diet that includes plenty of vegetables, fruits, low-fat dairy products, and lean protein. Do not eat a lot of foods that are high in solid fats, added sugars, or sodium. Maintain a healthy weight Body mass index (BMI) is a measurement that can be used to identify possible weight problems. It estimates body fat based on height and weight. Your health care provider can help determine your BMI and help you achieve or maintain a healthy weight. Get regular exercise Get regular exercise. This is one of the most important things you can do for your health. Most adults should: Exercise for at least 150 minutes each week. The exercise should increase your heart rate and make you sweat (moderate-intensity exercise). Do strengthening exercises at least twice a week. This is in addition to the moderate-intensity exercise. Spend less time sitting. Even light physical activity can be beneficial. Watch cholesterol and blood lipids Have your blood tested for lipids and cholesterol at 69 years of age, then have this test every 5 years. You may need to have your cholesterol levels checked more often if: Your lipid or cholesterol levels are high. You are older than 69 years of age. You are at high risk for heart disease. What should I know about cancer screening? Many types of cancers can be detected early and may often be prevented. Depending on your health history and family history, you may need to have cancer screening at various ages. This may include screening for: Colorectal cancer. Prostate cancer. Skin cancer. Lung  cancer. What should I know about heart disease, diabetes, and high blood pressure? Blood pressure and heart disease High blood pressure causes heart disease and increases the risk of stroke. This is more likely to develop in people who have high blood pressure readings or are overweight. Talk with your health care provider about your target blood pressure readings. Have your blood pressure checked: Every 3-5 years if you are 18-39 years of age. Every year if you are 40 years old or older. If you are between the ages of 65 and 75 and are a current or former smoker, ask your health care provider if you should have a one-time screening for abdominal aortic aneurysm (AAA). Diabetes Have regular diabetes screenings. This checks your fasting blood sugar level. Have the screening done: Once every three years after age 45 if you are at a normal weight and have a low risk for diabetes. More often and at a younger age if you are overweight or have a high risk for diabetes. What should I know about preventing infection? Hepatitis B If you have a higher risk for hepatitis B, you should be screened for this virus. Talk with your health care provider to find out if you are at risk for hepatitis B infection. Hepatitis C Blood testing is recommended for: Everyone born from 1945 through 1965. Anyone with known risk factors for hepatitis C. Sexually transmitted infections (STIs) You should be screened each year for STIs, including gonorrhea and chlamydia, if: You are sexually active and are younger than 69 years of age. You are older than 69 years of age and your   health care provider tells you that you are at risk for this type of infection. Your sexual activity has changed since you were last screened, and you are at increased risk for chlamydia or gonorrhea. Ask your health care provider if you are at risk. Ask your health care provider about whether you are at high risk for HIV. Your health care provider  may recommend a prescription medicine to help prevent HIV infection. If you choose to take medicine to prevent HIV, you should first get tested for HIV. You should then be tested every 3 months for as long as you are taking the medicine. Follow these instructions at home: Alcohol use Do not drink alcohol if your health care provider tells you not to drink. If you drink alcohol: Limit how much you have to 0-2 drinks a day. Know how much alcohol is in your drink. In the [place] one drink equals one 12 oz bottle of beer (355 mL), one 5 oz glass of wine (148 mL), or one 1 oz glass of hard liquor (44 mL). Lifestyle Do not use any products that contain nicotine or tobacco. These products include cigarettes, chewing tobacco, and vaping devices, such as e-cigarettes. If you need help quitting, ask your health care provider. Do not use street drugs. Do not share needles. Ask your health care provider for help if you need support or information about quitting drugs. General instructions Schedule regular health, dental, and eye exams. Stay current with your vaccines. Tell your health care provider if: You often feel depressed. You have ever been abused or do not feel safe at home. Summary Adopting a healthy lifestyle and getting preventive care are important in promoting health and wellness. Follow your health care provider's instructions about healthy diet, exercising, and getting tested or screened for diseases. Follow your health care provider's instructions on monitoring your cholesterol and blood pressure. This information is not intended to replace advice given to you by your health care provider. Make sure you discuss any questions you have with your health care provider. Document Revised: 08/25/2020 Document Reviewed: 08/25/2020 Elsevier Patient Education  2023 [employer] Inc.  

## 2022-08-11 NOTE — Assessment & Plan Note (Signed)
Refilled his diclofenac 75 mg BID, pt reports he does not require a refill on the other pain medications today. We did discuss the gabapentin, I encouraged him to try the medication to see if it would help with his pain sx. RTC 6 months

## 2022-08-11 NOTE — Assessment & Plan Note (Signed)
Current hypertension medications:       Sig   amLODipine (NORVASC) 10 MG tablet Take 1 tablet (10 mg total) by mouth daily.   metoprolol tartrate (LOPRESSOR) 100 MG tablet Take 1 tablet (100 mg total) by mouth 2 (two) times daily.      Bp is well controlled on the above medications, will continue as prescribed. Refilled today

## 2022-08-23 ENCOUNTER — Ambulatory Visit (INDEPENDENT_AMBULATORY_CARE_PROVIDER_SITE_OTHER): Payer: Medicare Other

## 2022-08-23 DIAGNOSIS — I639 Cerebral infarction, unspecified: Secondary | ICD-10-CM | POA: Diagnosis not present

## 2022-08-23 LAB — CUP PACEART REMOTE DEVICE CHECK
Date Time Interrogation Session: 20240503230416
Implantable Pulse Generator Implant Date: 20231218

## 2022-08-25 ENCOUNTER — Ambulatory Visit: Payer: Medicare Other | Admitting: Family Medicine

## 2022-08-30 NOTE — Progress Notes (Signed)
Carelink Summary Report / Loop Recorder 

## 2022-09-03 ENCOUNTER — Other Ambulatory Visit: Payer: Medicare Other

## 2022-09-22 NOTE — Progress Notes (Signed)
Carelink Summary Report / Loop Recorder 

## 2022-09-23 ENCOUNTER — Ambulatory Visit
Admission: RE | Admit: 2022-09-23 | Discharge: 2022-09-23 | Disposition: A | Discharge status: Home / Self Care | Payer: Medicare Other | Source: Ambulatory Visit | Attending: Otolaryngology | Admitting: Otolaryngology

## 2022-09-23 DIAGNOSIS — K118 Other diseases of salivary glands: Secondary | ICD-10-CM

## 2022-09-23 DIAGNOSIS — R590 Localized enlarged lymph nodes: Secondary | ICD-10-CM | POA: Diagnosis not present

## 2022-09-27 ENCOUNTER — Ambulatory Visit (INDEPENDENT_AMBULATORY_CARE_PROVIDER_SITE_OTHER): Payer: Medicare Other

## 2022-09-27 DIAGNOSIS — I639 Cerebral infarction, unspecified: Secondary | ICD-10-CM

## 2022-09-27 LAB — CUP PACEART REMOTE DEVICE CHECK
Date Time Interrogation Session: 20240609230135
Implantable Pulse Generator Implant Date: 20231218

## 2022-10-20 NOTE — Progress Notes (Signed)
Carelink Summary Report / Loop Recorder 

## 2022-11-01 ENCOUNTER — Ambulatory Visit (INDEPENDENT_AMBULATORY_CARE_PROVIDER_SITE_OTHER): Payer: Medicare Other

## 2022-11-01 DIAGNOSIS — I639 Cerebral infarction, unspecified: Secondary | ICD-10-CM

## 2022-11-02 LAB — CUP PACEART REMOTE DEVICE CHECK
Date Time Interrogation Session: 20240712230335
Implantable Pulse Generator Implant Date: 20231218

## 2022-11-17 NOTE — Progress Notes (Signed)
Carelink Summary Report / Loop Recorder 

## 2022-12-06 ENCOUNTER — Ambulatory Visit (INDEPENDENT_AMBULATORY_CARE_PROVIDER_SITE_OTHER): Payer: Medicare Other

## 2022-12-06 DIAGNOSIS — I639 Cerebral infarction, unspecified: Secondary | ICD-10-CM | POA: Diagnosis not present

## 2022-12-06 LAB — CUP PACEART REMOTE DEVICE CHECK
Date Time Interrogation Session: 20240818231030
Implantable Pulse Generator Implant Date: 20231218

## 2022-12-17 NOTE — Progress Notes (Signed)
Carelink Summary Report / Loop Recorder 

## 2022-12-29 ENCOUNTER — Ambulatory Visit (INDEPENDENT_AMBULATORY_CARE_PROVIDER_SITE_OTHER): Payer: Medicare Other | Admitting: Adult Health

## 2022-12-29 ENCOUNTER — Encounter: Payer: Self-pay | Admitting: Adult Health

## 2022-12-29 VITALS — BP 128/62 | HR 70 | Ht 69.0 in | Wt 216.2 lb

## 2022-12-29 DIAGNOSIS — G4700 Insomnia, unspecified: Secondary | ICD-10-CM

## 2022-12-29 DIAGNOSIS — E230 Hypopituitarism: Secondary | ICD-10-CM | POA: Diagnosis not present

## 2022-12-29 DIAGNOSIS — C61 Malignant neoplasm of prostate: Secondary | ICD-10-CM | POA: Diagnosis not present

## 2022-12-29 DIAGNOSIS — G4733 Obstructive sleep apnea (adult) (pediatric): Secondary | ICD-10-CM | POA: Diagnosis not present

## 2022-12-29 DIAGNOSIS — Z8546 Personal history of malignant neoplasm of prostate: Secondary | ICD-10-CM | POA: Diagnosis not present

## 2022-12-29 NOTE — Patient Instructions (Addendum)
Continue CPAP at bedtime, goal is to wear all night long for at least 6 or more hours Do not drive if sleepy.  Work on healthy weight loss Follow up in 6 months and As needed

## 2022-12-29 NOTE — Assessment & Plan Note (Signed)
Healthy sleep regimen

## 2022-12-29 NOTE — Assessment & Plan Note (Signed)
Severe OSA with perceived benefit. Helpful hints.  Encouraged on compliance   Plan  Patient Instructions  Continue CPAP at bedtime, goal is to wear all night long for at least 6 or more hours Do not drive if sleepy.  Work on healthy weight loss Follow up in 6 months and As needed
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
## 2022-12-30 NOTE — Progress Notes (Signed)
Reviewed and agree with assessment/plan.   Coralyn Helling, MD [facility]monary/Critical [facility] 12/30/2022, 1:11 PM Pager:  [phone]

## 2023-01-05 DIAGNOSIS — Z8546 Personal history of malignant neoplasm of prostate: Secondary | ICD-10-CM | POA: Diagnosis not present

## 2023-01-05 DIAGNOSIS — R351 Nocturia: Secondary | ICD-10-CM | POA: Diagnosis not present

## 2023-01-05 DIAGNOSIS — R3915 Urgency of urination: Secondary | ICD-10-CM | POA: Diagnosis not present

## 2023-01-10 ENCOUNTER — Ambulatory Visit: Payer: Medicare Other

## 2023-01-10 DIAGNOSIS — I639 Cerebral infarction, unspecified: Secondary | ICD-10-CM

## 2023-01-11 LAB — CUP PACEART REMOTE DEVICE CHECK
Date Time Interrogation Session: 20240920230607
Implantable Pulse Generator Implant Date: 20231218

## 2023-01-25 ENCOUNTER — Other Ambulatory Visit: Payer: Self-pay | Admitting: Family Medicine

## 2023-01-25 DIAGNOSIS — I10 Essential (primary) hypertension: Secondary | ICD-10-CM

## 2023-01-25 NOTE — Progress Notes (Signed)
Carelink Summary Report / Loop Recorder 

## 2023-01-31 NOTE — Telephone Encounter (Signed)
Pt checking on refill request

## 2023-02-10 ENCOUNTER — Ambulatory Visit: Payer: Medicare Other | Admitting: Family Medicine

## 2023-02-10 ENCOUNTER — Encounter: Payer: Self-pay | Admitting: Family Medicine

## 2023-02-10 VITALS — BP 118/58 | HR 60 | Temp 97.7°F | Ht 69.0 in | Wt 213.7 lb

## 2023-02-10 DIAGNOSIS — I10 Essential (primary) hypertension: Secondary | ICD-10-CM | POA: Diagnosis not present

## 2023-02-10 DIAGNOSIS — G4700 Insomnia, unspecified: Secondary | ICD-10-CM

## 2023-02-10 DIAGNOSIS — E7800 Pure hypercholesterolemia, unspecified: Secondary | ICD-10-CM | POA: Diagnosis not present

## 2023-02-10 LAB — LIPID PANEL
Cholesterol: 207 mg/dL — ABNORMAL HIGH (ref 0–200)
HDL: 53.5 mg/dL (ref >39.00)
LDL Cholesterol: 127 mg/dL — ABNORMAL HIGH (ref 0–99)
NonHDL: 153.34
Total CHOL/HDL Ratio: 4
Triglycerides: 134 mg/dL (ref 0.0–149.0)
VLDL: 26.8 mg/dL (ref 0.0–40.0)

## 2023-02-10 LAB — COMPREHENSIVE METABOLIC PANEL
ALT: 17 U/L (ref 0–53)
AST: 17 U/L (ref 0–37)
Albumin: 4.4 g/dL (ref 3.5–5.2)
Alkaline Phosphatase: 17 U/L — ABNORMAL LOW (ref 39–117)
BUN: 19 mg/dL (ref 6–23)
CO2: 27 meq/L (ref 19–32)
Calcium: 9.8 mg/dL (ref 8.4–10.5)
Chloride: 105 meq/L (ref 96–112)
Creatinine, Ser: 1.11 mg/dL (ref 0.40–1.50)
GFR: 67.68 mL/min (ref >60.00)
Glucose, Bld: 98 mg/dL (ref 70–99)
Potassium: 4.4 meq/L (ref 3.5–5.1)
Sodium: 141 meq/L (ref 135–145)
Total Bilirubin: 0.6 mg/dL (ref 0.2–1.2)
Total Protein: 7.1 g/dL (ref 6.0–8.3)

## 2023-02-10 MED ORDER — AMITRIPTYLINE HCL 25 MG PO TABS
25.0000 mg | ORAL_TABLET | Freq: Every evening | ORAL | 2 refills | Status: DC | PRN
Start: 2023-02-10 — End: 2023-03-22

## 2023-02-10 MED ORDER — METOPROLOL TARTRATE 100 MG PO TABS: 100.0000 mg | ORAL_TABLET | Freq: Two times a day (BID) | ORAL | 0 refills | Status: DC

## 2023-02-10 MED ORDER — AMLODIPINE BESYLATE 10 MG PO TABS: 10.0000 mg | ORAL_TABLET | Freq: Every day | ORAL | 1 refills | Status: DC

## 2023-02-10 MED ORDER — ROSUVASTATIN CALCIUM 5 MG PO TABS
5.0000 mg | ORAL_TABLET | Freq: Every day | ORAL | 11 refills | Status: DC
Start: 2023-02-10 — End: 2023-10-18

## 2023-02-10 NOTE — Patient Instructions (Signed)
Try increasing to 2 capsules of gabapentin at nighttime-- start back on the 1 capsule first at bedtime for about 1-2 weeks, then try increasing to 2 capsules at night.

## 2023-02-10 NOTE — Assessment & Plan Note (Signed)
Current hypertension medications:       Sig   amLODipine (NORVASC) 10 MG tablet Take 1 tablet (10 mg total) by mouth daily.   metoprolol tartrate (LOPRESSOR) 100 MG tablet Take 1 tablet (100 mg total) by mouth 2 (two) times daily.      Bp is well controlled on the above medications, will continue as prescribed. Refilled today, needs new CMP

## 2023-02-10 NOTE — Assessment & Plan Note (Signed)
On rosuvastatin 5 mg daily, doing well with this medication, needs new lipid panel, refills sent to pharmacy

## 2023-02-10 NOTE — Progress Notes (Signed)
Established Patient Office Visit  Subjective   Patient ID: Luis Fisher, male    DOB: November 30, 1955  Age: 67 y.o. MRN: [id number]  Chief Complaint  Patient presents with   Medical Management of Chronic Issues   Insomnia    X1 year, only tried Chamomile tea with no relief    Pt is here for 6 month follow up today.  He is reporting a chronic 1 year history of insomnia ever since he had his stroke. He reports his sleep has always been irregular, will often be unable to sleep for a couple of days, then the third day he will "crash" and sleep all day. He has been using chamomile tea, vallerian root, lemon bombs, and delta 8 gummies without improvement. States that he also quit drinking alcohol for the last year. He already takes melatonin OTC every night, not sure of the mg, this is also not working. Reports he was given trazodone in the past without improvement in his sleep. States he does have 2 nighttime awakenings usually at night to urinate.   HTN -- BP in office performed and is well controlled. He  reports no side effects to the medications, no chest pain, SOB, dizziness or headaches. He has a BP cuff at home and is checking BP regularly, reports they are in the normal range. Is due for his annual lipid panel and CMP  HLD-- pt reports compliance with his rosuvastatin, denies any side effects today, needs refills and new lipid panel.      Insomnia Primary symptoms: difficulty falling asleep.   The current episode started more than one year. The problem occurs nightly. The problem is unchanged. How many beverages per day that contain caffeine: 0 - 1.  Types of beverages you drink: coffee. Past treatments include medication. The treatment provided no relief. Typical bedtime:  10-11 P.M..  How long after going to bed to you fall asleep: 15-30 minutes.   PMH includes: chronic pain.     Current Outpatient Medications  Medication Instructions   allopurinol (ZYLOPRIM) 100 mg, Oral, Daily    amitriptyline (ELAVIL) 25 mg, Oral, At bedtime PRN   amLODipine (NORVASC) 10 mg, Oral, Daily   Aspirin Low Dose 81 mg, Oral, Daily, Swallow whole.   Cholecalciferol (D3 PO) 1 capsule, Oral, Daily   co-enzyme Q-10 30 mg, Oral, Weekly, 3-4 times per week.   cyanocobalamin (VITAMIN B12) 250 mcg, Oral, Daily   fluticasone (FLONASE) 50 MCG/ACT nasal spray USE 1-2 SPRAYS IN EACH NOSTRIL DAILY AS NEEDED.   HYDROcodone-acetaminophen (NORCO) 5-325 MG tablet 1 tablet, Oral, 2 times daily PRN   lidocaine (LIDODERM) 5 % APPLY 1 PATCH TO THE SKIN DAILY. REMOVE & DISCARD PATCH WITHIN 12 HOURS OR AS DIRECTED BY MD.   meloxicam (MOBIC) 7.5 mg, Oral, Daily PRN   metoprolol tartrate (LOPRESSOR) 100 mg, Oral, 2 times daily   predniSONE (DELTASONE) 20 MG tablet TAKE ONE TABLET BY MOUTH TWICE DAILY AS NEEDED FOR ARTHRITIS FLARE   rosuvastatin (CRESTOR) 5 mg, Oral, Daily   traMADol (ULTRAM) 50 MG tablet TAKE ONE OR TWO TABLETS BY MOUTH EVERY SIX HOURS AS NEEDED FOR MODERATE PAIN    Patient Active Problem List   Diagnosis Date Noted   OSA (obstructive sleep apnea) 05/05/2022   CVA (cerebral vascular accident) (HCC) 03/10/2022   Hemiparesis, speech and language deficits, and cognitive deficits due to recent cerebral infarction (HCC) 03/10/2022   Decreased functional mobility 03/10/2022   Snoring 02/09/2022   Insomnia 02/09/2022  Hyperlipidemia 01/18/2022   TIA (transient ischemic attack) 01/16/2022   Prostate cancer (HCC) 02/16/2016   Nephrolithiasis 06/24/2015   History of diverticulitis 06/24/2015   Postoperative anemia due to acute blood loss 01/02/2013   OA (osteoarthritis) of hip 01/01/2013   Testosterone deficiency 06/18/2009   DISH (diffuse idiopathic skeletal hyperostosis) 06/16/2007   History of colonic polyps 06/16/2007   Essential hypertension 01/16/2007      Review of Systems  Psychiatric/Behavioral:  The patient has insomnia.   All other systems reviewed and are negative.      Objective:     BP (!) 118/58 (BP Location: Left Arm, Patient Position: Sitting, Cuff Size: Large)   Pulse 60   Temp 97.7 F (36.5 C) (Oral)   Ht 5\' 9"  (1.753 m)   Wt 213 lb 11.2 oz (96.9 kg)   SpO2 99%   BMI 31.56 kg/m    Physical Exam Vitals reviewed.  Constitutional:      Appearance: Normal appearance. He is well-groomed. He is obese.  Neck:     Thyroid: No thyromegaly.  Cardiovascular:     Rate and Rhythm: Normal rate and regular rhythm.     Heart sounds: S1 normal and S2 normal. No murmur heard. Pulmonary:     Effort: Pulmonary effort is normal.     Breath sounds: Normal breath sounds and air entry. No rales.  Musculoskeletal:     Right lower leg: No edema.     Left lower leg: No edema.  Neurological:     General: No focal deficit present.     Mental Status: He is alert and oriented to person, place, and time.     Gait: Gait is intact.  Psychiatric:        Mood and Affect: Mood and affect normal.        Behavior: Behavior normal.      No results found for any visits on 02/10/23.    The ASCVD Risk score (Arnett DK, et al., 2019) failed to calculate for the following reasons:   The patient has a prior MI or stroke diagnosis    Assessment & Plan:  Insomnia, unspecified type Assessment & Plan: Has tried multiple medications/ OTC/herbal products in the past without substantial relief. States that he took trazodone and this was ineffective. We discussed increasing his gabapentin to 600 mg at bedtime and also adding amitriptyline 25 mg at bedtime to help with sleep. Pt is agreeable t try this strategy at home and he will let me know over Mychart if this works for him.   Orders: -     Amitriptyline HCl; Take 1 tablet (25 mg total) by mouth at bedtime as needed for sleep.  Dispense: 30 tablet; Refill: 2  Essential hypertension Assessment & Plan: Current hypertension medications:       Sig   amLODipine (NORVASC) 10 MG tablet Take 1 tablet (10 mg total) by  mouth daily.   metoprolol tartrate (LOPRESSOR) 100 MG tablet Take 1 tablet (100 mg total) by mouth 2 (two) times daily.      Bp is well controlled on the above medications, will continue as prescribed. Refilled today, needs new CMP  Orders: -     Metoprolol Tartrate; Take 1 tablet (100 mg total) by mouth 2 (two) times daily.  Dispense: 180 tablet; Refill: 0 -     amLODIPine Besylate; Take 1 tablet (10 mg total) by mouth daily.  Dispense: 90 tablet; Refill: 1 -     Comprehensive metabolic panel  Pure hypercholesterolemia Assessment & Plan: On rosuvastatin 5 mg daily, doing well with this medication, needs new lipid panel, refills sent to pharmacy  Orders: -     Lipid panel; Future -     Rosuvastatin Calcium; Take 1 tablet (5 mg total) by mouth daily.  Dispense: 30 tablet; Refill: 11     Return in about 6 months (around 08/11/2023) for follow up.    Karie Georges, MD

## 2023-02-10 NOTE — Assessment & Plan Note (Signed)
Has tried multiple medications/ OTC/herbal products in the past without substantial relief. States that he took trazodone and this was ineffective. We discussed increasing his gabapentin to 600 mg at bedtime and also adding amitriptyline 25 mg at bedtime to help with sleep. Pt is agreeable t try this strategy at home and he will let me know over Mychart if this works for him.

## 2023-02-14 ENCOUNTER — Ambulatory Visit (INDEPENDENT_AMBULATORY_CARE_PROVIDER_SITE_OTHER): Payer: Medicare Other

## 2023-02-14 DIAGNOSIS — I639 Cerebral infarction, unspecified: Secondary | ICD-10-CM | POA: Diagnosis not present

## 2023-02-14 LAB — CUP PACEART REMOTE DEVICE CHECK
Date Time Interrogation Session: 20241027231443
Implantable Pulse Generator Implant Date: 20231218
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
## 2023-02-15 NOTE — Patient Instructions (Incomplete)
Below is our plan:  We will continue to monitor. Continue rosuvastatin and aspirin for stroke prevention. Please talk to Dr Casimiro Needle about increasing your rosuvastatin.   Goals:  1) Maintain strict control of hypertension with blood pressure goal below 130/90 2) Maintain good control of diabetes with hemoglobin A1c goal below 7%  3) Maintain good control of lipids with LDL cholesterol goal below 70 mg/dL.  4) Eat a healthy diet with plenty of whole grains, cereals, fruits and vegetables, exercise regularly and maintain ideal body weight   Resources: https://www.williams.biz/  Please make sure you are staying well hydrated. I recommend 50-60 ounces daily. Well balanced diet and regular exercise encouraged. Consistent sleep schedule with 6-8 hours recommended.   Please continue follow up with care team as directed.   Follow up with me as needed   You may receive a survey regarding today's visit. I encourage you to leave honest feed back as I do use this information to improve patient care. Thank you for seeing me today!

## 2023-02-17 ENCOUNTER — Ambulatory Visit: Payer: Medicare Other | Admitting: Family Medicine

## 2023-02-17 ENCOUNTER — Encounter: Payer: Self-pay | Admitting: Family Medicine

## 2023-02-17 VITALS — BP 127/67 | HR 69 | Ht 68.0 in | Wt 213.5 lb

## 2023-02-17 DIAGNOSIS — I6389 Other cerebral infarction: Secondary | ICD-10-CM

## 2023-03-06 ENCOUNTER — Encounter: Payer: Self-pay | Admitting: Family Medicine

## 2023-03-07 NOTE — Progress Notes (Signed)
Carelink Summary Report / Loop Recorder 

## 2023-03-21 ENCOUNTER — Ambulatory Visit: Payer: Medicare Other

## 2023-03-21 DIAGNOSIS — I639 Cerebral infarction, unspecified: Secondary | ICD-10-CM

## 2023-03-21 LAB — CUP PACEART REMOTE DEVICE CHECK
Date Time Interrogation Session: 20241129230653
Implantable Pulse Generator Implant Date: 20231218

## 2023-03-23 ENCOUNTER — Telehealth: Payer: Medicare Other | Admitting: Cardiovascular Disease

## 2023-04-25 ENCOUNTER — Ambulatory Visit (INDEPENDENT_AMBULATORY_CARE_PROVIDER_SITE_OTHER): Payer: Medicare Other

## 2023-04-25 DIAGNOSIS — I639 Cerebral infarction, unspecified: Secondary | ICD-10-CM

## 2023-04-26 LAB — CUP PACEART REMOTE DEVICE CHECK
Date Time Interrogation Session: 20250105231543
Implantable Pulse Generator Implant Date: 20231218

## 2023-05-30 ENCOUNTER — Ambulatory Visit (INDEPENDENT_AMBULATORY_CARE_PROVIDER_SITE_OTHER): Payer: Medicare Other

## 2023-05-30 DIAGNOSIS — I639 Cerebral infarction, unspecified: Secondary | ICD-10-CM | POA: Diagnosis not present

## 2023-05-30 LAB — CUP PACEART REMOTE DEVICE CHECK
Date Time Interrogation Session: 20250209231928
Implantable Pulse Generator Implant Date: 20231218

## 2023-06-02 ENCOUNTER — Encounter: Payer: Self-pay | Admitting: Cardiovascular Disease

## 2023-06-06 NOTE — Addendum Note (Signed)
Addended by: Geralyn Flash D on: 06/06/2023 03:18 PM   Modules accepted: Orders

## 2023-06-06 NOTE — Progress Notes (Signed)
 [facility] Summary Report / Loop Recorder

## 2023-06-15 ENCOUNTER — Other Ambulatory Visit: Payer: Self-pay | Admitting: Family Medicine

## 2023-06-15 DIAGNOSIS — M4810 Ankylosing hyperostosis [Forestier], site unspecified: Secondary | ICD-10-CM

## 2023-07-04 ENCOUNTER — Ambulatory Visit (INDEPENDENT_AMBULATORY_CARE_PROVIDER_SITE_OTHER): Payer: Medicare Other

## 2023-07-04 DIAGNOSIS — I639 Cerebral infarction, unspecified: Secondary | ICD-10-CM | POA: Diagnosis not present

## 2023-07-05 LAB — CUP PACEART REMOTE DEVICE CHECK
Date Time Interrogation Session: 20250316231544
Implantable Pulse Generator Implant Date: 20231218

## 2023-07-07 NOTE — Progress Notes (Signed)
 [facility] Summary Report / Loop Recorder

## 2023-07-11 ENCOUNTER — Encounter: Payer: Self-pay | Admitting: Cardiovascular Disease

## 2023-08-08 ENCOUNTER — Ambulatory Visit (INDEPENDENT_AMBULATORY_CARE_PROVIDER_SITE_OTHER): Payer: Medicare Other

## 2023-08-08 DIAGNOSIS — I639 Cerebral infarction, unspecified: Secondary | ICD-10-CM | POA: Diagnosis not present

## 2023-08-08 LAB — CUP PACEART REMOTE DEVICE CHECK
Date Time Interrogation Session: 20250420231529
Implantable Pulse Generator Implant Date: 20231218

## 2023-08-12 ENCOUNTER — Encounter: Payer: Self-pay | Admitting: Cardiovascular Disease

## 2023-08-24 NOTE — Addendum Note (Signed)
 Addended by: Edra Govern D on: 08/24/2023 02:21 PM   Modules accepted: Orders

## 2023-08-24 NOTE — Progress Notes (Signed)
 [facility] Summary Report / Loop Recorder

## 2023-08-29 ENCOUNTER — Other Ambulatory Visit: Payer: Self-pay | Admitting: Family Medicine

## 2023-08-29 DIAGNOSIS — I10 Essential (primary) hypertension: Secondary | ICD-10-CM

## 2023-08-29 MED ORDER — METOPROLOL TARTRATE 100 MG PO TABS: 100.0000 mg | ORAL_TABLET | Freq: Two times a day (BID) | ORAL | 0 refills | Status: DC

## 2023-08-29 NOTE — Telephone Encounter (Signed)
 Last Fill: 02/10/23  Last OV: 02/10/23 Next OV: 10/18/23  Routing to provider for review/authorization.

## 2023-08-29 NOTE — Telephone Encounter (Signed)
 Copied from CRM [id number]. Topic: Clinical - Medication Refill >> Aug 29, 2023  2:53 PM Fredrica W wrote: Medication: metoprolol  tartrate (LOPRESSOR ) 100 MG tablet   Has the patient contacted their pharmacy? Yes (Agent: If no, request that the patient contact the pharmacy for the refill. If patient does not wish to contact the pharmacy document the reason why and proceed with request.) (Agent: If yes, when and what did the pharmacy advise?) no refills   This is the patient's preferred pharmacy:  [facility] [address] [place] - [address] Otha Blight [place] [address] Phone: [phone] Fax: [phone]  Is this the correct pharmacy for this prescription? Yes If no, delete pharmacy and type the correct one.   Has the prescription been filled recently? No  Is the patient out of the medication? No  Has the patient been seen for an appointment in the last year OR does the patient have an upcoming appointment? Yes scheduled for 7/1  Can we respond through MyChart? Yes  Agent: Please be advised that Rx refills may take up to 3 business days. We ask that you follow-up with your pharmacy.

## 2023-09-08 ENCOUNTER — Ambulatory Visit (INDEPENDENT_AMBULATORY_CARE_PROVIDER_SITE_OTHER)

## 2023-09-08 DIAGNOSIS — I639 Cerebral infarction, unspecified: Secondary | ICD-10-CM | POA: Diagnosis not present

## 2023-09-08 LAB — CUP PACEART REMOTE DEVICE CHECK
Date Time Interrogation Session: 20250521232119
Implantable Pulse Generator Implant Date: 20231218

## 2023-09-09 ENCOUNTER — Ambulatory Visit: Payer: Self-pay | Admitting: Cardiovascular Disease

## 2023-09-28 NOTE — Progress Notes (Signed)
 [facility] Summary Report / Loop Recorder

## 2023-10-10 ENCOUNTER — Ambulatory Visit (INDEPENDENT_AMBULATORY_CARE_PROVIDER_SITE_OTHER)

## 2023-10-10 DIAGNOSIS — I639 Cerebral infarction, unspecified: Secondary | ICD-10-CM | POA: Diagnosis not present

## 2023-10-11 LAB — CUP PACEART REMOTE DEVICE CHECK
Date Time Interrogation Session: 20250622233111
Implantable Pulse Generator Implant Date: 20231218

## 2023-10-12 ENCOUNTER — Ambulatory Visit: Payer: Self-pay | Admitting: Cardiovascular Disease

## 2023-10-17 ENCOUNTER — Ambulatory Visit: Payer: Medicare Other

## 2023-10-18 ENCOUNTER — Encounter: Payer: Self-pay | Admitting: Family Medicine

## 2023-10-18 ENCOUNTER — Ambulatory Visit (INDEPENDENT_AMBULATORY_CARE_PROVIDER_SITE_OTHER): Admitting: Family Medicine

## 2023-10-18 VITALS — BP 128/74 | HR 67 | Temp 98.1°F | Ht 68.0 in | Wt 201.6 lb

## 2023-10-18 DIAGNOSIS — Z1211 Encounter for screening for malignant neoplasm of colon: Secondary | ICD-10-CM

## 2023-10-18 DIAGNOSIS — M4810 Ankylosing hyperostosis [Forestier], site unspecified: Secondary | ICD-10-CM | POA: Diagnosis not present

## 2023-10-18 DIAGNOSIS — L219 Seborrheic dermatitis, unspecified: Secondary | ICD-10-CM

## 2023-10-18 DIAGNOSIS — H8120 Vestibular neuronitis, unspecified ear: Secondary | ICD-10-CM | POA: Diagnosis not present

## 2023-10-18 DIAGNOSIS — I10 Essential (primary) hypertension: Secondary | ICD-10-CM

## 2023-10-18 MED ORDER — PREDNISONE 20 MG PO TABS: ORAL_TABLET | ORAL | 2 refills | Status: AC

## 2023-10-18 MED ORDER — MECLIZINE HCL 25 MG PO TABS: 25.0000 mg | ORAL_TABLET | Freq: Three times a day (TID) | ORAL | 2 refills | Status: AC | PRN

## 2023-10-18 MED ORDER — AMLODIPINE BESYLATE 10 MG PO TABS: 10.0000 mg | ORAL_TABLET | Freq: Every day | ORAL | 1 refills | Status: AC

## 2023-10-18 MED ORDER — METOPROLOL TARTRATE 100 MG PO TABS: 100.0000 mg | ORAL_TABLET | Freq: Two times a day (BID) | ORAL | 1 refills | Status: AC

## 2023-10-18 MED ORDER — ONDANSETRON 4 MG PO TBDP: 4.0000 mg | ORAL_TABLET | Freq: Three times a day (TID) | ORAL | 2 refills | Status: AC | PRN

## 2023-10-18 NOTE — Assessment & Plan Note (Signed)
 Needs refills of his PRN prednisone , I counseled the patient on weaning the prednisone  if he takes it for more than 5-6 days at a time. Usually only taking it for 1-3 days at a time.

## 2023-10-18 NOTE — Progress Notes (Signed)
 Established Patient Office Visit  Subjective   Patient ID: Luis Fisher, male    DOB: 08-Jun-1955  Age: 68 y.o. MRN: [id number]  Chief Complaint  Patient presents with   Medical Management of Chronic Issues   Dizziness    X5 days, severe episodes with nausea, vomiting for 2 days, did not seek treatment at an urgent care or ER   Insomnia    X2 years, tried 2 medications previously with no relief (cannot recall exact names), using Delta 8 CBD gummies    Pt reports 5 day history of severe dizziness, [place] , [place] elevation, thought he was feeling a little dizzy then it had gotten a bit worse. Then in the morning he had very severe episode of vertigo, states that it was persistent and severe, couldn't do anything, it was causing the nausea and dry heaves. States that since then it has improved rapidly over time. States that since he has been back in [place] he is 70% better. States that this has happened to him before. States that after the first episode he didn't have any problems for about 2 years. Then it happened again.   DISH-- pt needs refills on his prednisone  therapy for his acute flare ups. States that he hasn't had  to take any for the past 6 months, but when it flares up he needs the prednisone  to help reduce the flare ups. States that when he feels it coming on he will take 1 tablet, sometimes 2 tablets, and then it usually controls the flare up.   Pt states he stopped taking the crestor  -- states that he felt so bad for a long time that he decided to stop taking a lot of medications, except for his blood pressure medication. States that he feels much better off of the crestor , states he has more energy without it.   HTN -- BP in office performed and is well controlled. He  reports no side effects to the medications, no chest pain, SOB, dizziness or headaches. He has a BP cuff at home and is checking BP regularly, reports they are in the normal  range.        Current Outpatient Medications  Medication Instructions   amLODipine  (NORVASC ) 10 mg, Oral, Daily   Aspirin  Low Dose 81 mg, Oral, Daily, Swallow whole.   Cholecalciferol (D3 PO) 1 capsule, Daily   co-enzyme Q-10 30 mg, Weekly   cyanocobalamin  (VITAMIN B12) 250 mcg, Daily   fluticasone  (FLONASE ) 50 MCG/ACT nasal spray USE 1-2 SPRAYS IN EACH NOSTRIL DAILY AS NEEDED.   meclizine  (ANTIVERT ) 25 mg, Oral, 3 times daily PRN   metoprolol  tartrate (LOPRESSOR ) 100 mg, Oral, 2 times daily   ondansetron  (ZOFRAN -ODT) 4 mg, Oral, Every 8 hours PRN   predniSONE  (DELTASONE ) 20 MG tablet TAKE ONE TABLET BY MOUTH TWICE DAILY AS NEEDED FOR ARTHRITIS FLARE    Patient Active Problem List   Diagnosis Date Noted   Seborrheic dermatitis 10/18/2023   OSA (obstructive sleep apnea) 05/05/2022   CVA (cerebral vascular accident) (HCC) 03/10/2022   Hemiparesis, speech and language deficits, and cognitive deficits due to recent cerebral infarction (HCC) 03/10/2022   Decreased functional mobility 03/10/2022   Snoring 02/09/2022   Insomnia 02/09/2022   Hyperlipidemia 01/18/2022   TIA (transient ischemic attack) 01/16/2022   Prostate cancer (HCC) 02/16/2016   Nephrolithiasis 06/24/2015   History of diverticulitis 06/24/2015   Postoperative anemia due to acute blood loss 01/02/2013  OA (osteoarthritis) of hip 01/01/2013   Testosterone  deficiency 06/18/2009   DISH (diffuse idiopathic skeletal hyperostosis) 06/16/2007   History of colonic polyps 06/16/2007   Essential hypertension 01/16/2007      Review of Systems  All other systems reviewed and are negative.     Objective:     BP 128/74   Pulse 67   Temp 98.1 F (36.7 C) (Oral)   Ht 5' 8 (1.727 m)   Wt 201 lb 9.6 oz (91.4 kg)   SpO2 98%   BMI 30.65 kg/m    Physical Exam Vitals reviewed.  Constitutional:      Appearance: Normal appearance. He is well-groomed and overweight.  Eyes:     Extraocular Movements: Extraocular  movements intact.     Conjunctiva/sclera: Conjunctivae normal.  Neck:     Thyroid : No thyromegaly.  Cardiovascular:     Rate and Rhythm: Normal rate and regular rhythm.     Heart sounds: S1 normal and S2 normal. No murmur heard. Pulmonary:     Effort: Pulmonary effort is normal.     Breath sounds: Normal breath sounds and air entry. No rales.  Abdominal:     General: Abdomen is flat. Bowel sounds are normal.  Musculoskeletal:     Right lower leg: No edema.     Left lower leg: No edema.  Skin:    Comments: Raised somewhat papular rash on the forehead, nasolabial folds and in the beard, there is mild flaking of the skin in this area.  Neurological:     General: No focal deficit present.     Mental Status: He is alert and oriented to person, place, and time.     Gait: Gait is intact.  Psychiatric:        Mood and Affect: Mood and affect normal.      No results found for any visits on 10/18/23.    The ASCVD Risk score (Arnett DK, et al., 2019) failed to calculate for the following reasons:   Risk score cannot be calculated because patient has a medical history suggesting prior/existing ASCVD    Assessment & Plan:  Vestibular neuronitis, unspecified laterality Given the history he gave this is the most likely diagnosis, he has had near resolution of his symptoms at this current time., will give PRN meclizine  and ondansetron  for him to keep at home in case this happens again, we discussed possible need for PT for balance rehab, however pt wants to wait to seei fhis sx resolve completely.   -     Meclizine  HCl; Take 1 tablet (25 mg total) by mouth 3 (three) times daily as needed for dizziness.  Dispense: 30 tablet; Refill: 2 -     Ondansetron ; Take 1 tablet (4 mg total) by mouth every 8 (eight) hours as needed for nausea or vomiting.  Dispense: 20 tablet; Refill: 2  DISH (diffuse idiopathic skeletal hyperostosis) Assessment & Plan: Needs refills of his PRN prednisone , I counseled  the patient on weaning the prednisone  if he takes it for more than 5-6 days at a time. Usually only taking it for 1-3 days at a time.   Orders: -     predniSONE ; TAKE ONE TABLET BY MOUTH TWICE DAILY AS NEEDED FOR ARTHRITIS FLARE  Dispense: 30 tablet; Refill: 2  Essential hypertension Assessment & Plan: Current hypertension medications:       Sig   amLODipine  (NORVASC ) 10 MG tablet Take 1 tablet (10 mg total) by mouth daily.   metoprolol  tartrate (LOPRESSOR )  100 MG tablet Take 1 tablet (100 mg total) by mouth 2 (two) times daily.      Bp is well controlled on the above medications, will continue as prescribed.   Orders: -     Metoprolol  Tartrate; Take 1 tablet (100 mg total) by mouth 2 (two) times daily.  Dispense: 180 tablet; Refill: 1 -     amLODIPine  Besylate; Take 1 tablet (10 mg total) by mouth daily.  Dispense: 90 tablet; Refill: 1  Colon cancer screening -     Cologuard  Seborrheic dermatitis Assessment & Plan: Suspected on scalp and face., I recommended using ketoconazole shampoo OTC daily for 1-2 weeks, thenuse 1-2 times per week to prevent flare up. If this does not improve his skin then we may send him to dermatology.      Return in about 6 months (around 04/19/2024) for HTN.    Heron CHRISTELLA Sharper, MD

## 2023-10-18 NOTE — Assessment & Plan Note (Signed)
 Current hypertension medications:       Sig   amLODipine  (NORVASC ) 10 MG tablet Take 1 tablet (10 mg total) by mouth daily.   metoprolol  tartrate (LOPRESSOR ) 100 MG tablet Take 1 tablet (100 mg total) by mouth 2 (two) times daily.      Bp is well controlled on the above medications, will continue as prescribed.

## 2023-10-26 NOTE — Assessment & Plan Note (Addendum)
 Suspected on scalp and face., I recommended using ketoconazole shampoo OTC daily for 1-2 weeks, thenuse 1-2 times per week to prevent flare up. If this does not improve his skin then we may send him to dermatology.

## 2023-10-26 NOTE — Progress Notes (Signed)
 [facility] Summary Report / Loop Recorder

## 2023-11-10 ENCOUNTER — Ambulatory Visit

## 2023-11-10 DIAGNOSIS — I639 Cerebral infarction, unspecified: Secondary | ICD-10-CM | POA: Diagnosis not present

## 2023-11-10 LAB — CUP PACEART REMOTE DEVICE CHECK
Date Time Interrogation Session: 20250723233115
Implantable Pulse Generator Implant Date: 20231218

## 2023-11-12 ENCOUNTER — Ambulatory Visit: Payer: Self-pay | Admitting: Cardiovascular Disease

## 2023-11-15 DIAGNOSIS — Z1211 Encounter for screening for malignant neoplasm of colon: Secondary | ICD-10-CM | POA: Diagnosis not present

## 2023-11-16 NOTE — Progress Notes (Signed)
 [facility] Summary Report / Loop Recorder

## 2023-11-16 NOTE — Addendum Note (Signed)
 Addended by: TAWNI DRILLING D on: 11/16/2023 11:34 AM   Modules accepted: Orders

## 2023-11-21 ENCOUNTER — Ambulatory Visit: Payer: Medicare Other

## 2023-11-23 ENCOUNTER — Ambulatory Visit: Payer: Self-pay | Admitting: Family Medicine

## 2023-11-23 LAB — COLOGUARD: COLOGUARD: NEGATIVE

## 2023-12-12 ENCOUNTER — Ambulatory Visit

## 2023-12-12 DIAGNOSIS — I639 Cerebral infarction, unspecified: Secondary | ICD-10-CM | POA: Diagnosis not present

## 2023-12-13 ENCOUNTER — Ambulatory Visit: Payer: Self-pay | Admitting: Cardiovascular Disease

## 2023-12-13 LAB — CUP PACEART REMOTE DEVICE CHECK
Date Time Interrogation Session: 20250823232728
Implantable Pulse Generator Implant Date: 20231218

## 2023-12-26 ENCOUNTER — Ambulatory Visit: Payer: Medicare Other

## 2024-01-04 NOTE — Progress Notes (Signed)
 Remote Loop Recorder Transmission

## 2024-01-12 ENCOUNTER — Ambulatory Visit

## 2024-01-12 ENCOUNTER — Ambulatory Visit: Payer: Self-pay | Admitting: Cardiovascular Disease

## 2024-01-12 DIAGNOSIS — I639 Cerebral infarction, unspecified: Secondary | ICD-10-CM | POA: Diagnosis not present

## 2024-01-12 LAB — CUP PACEART REMOTE DEVICE CHECK
Date Time Interrogation Session: 20250924233022
Implantable Pulse Generator Implant Date: 20231218

## 2024-01-16 NOTE — Progress Notes (Signed)
 Remote Loop Recorder Transmission

## 2024-01-19 NOTE — Progress Notes (Signed)
 Remote Loop Recorder Transmission

## 2024-01-30 ENCOUNTER — Ambulatory Visit: Payer: Medicare Other

## 2024-02-13 ENCOUNTER — Ambulatory Visit (INDEPENDENT_AMBULATORY_CARE_PROVIDER_SITE_OTHER)

## 2024-02-13 DIAGNOSIS — I639 Cerebral infarction, unspecified: Secondary | ICD-10-CM | POA: Diagnosis not present

## 2024-02-13 LAB — CUP PACEART REMOTE DEVICE CHECK
Date Time Interrogation Session: 20251026233206
Implantable Pulse Generator Implant Date: 20231218

## 2024-02-14 ENCOUNTER — Ambulatory Visit: Payer: Self-pay | Admitting: Cardiovascular Disease

## 2024-02-16 NOTE — Progress Notes (Signed)
 Remote Loop Recorder Transmission

## 2024-02-23 DIAGNOSIS — H52203 Unspecified astigmatism, bilateral: Secondary | ICD-10-CM | POA: Diagnosis not present

## 2024-02-23 DIAGNOSIS — H353131 Nonexudative age-related macular degeneration, bilateral, early dry stage: Secondary | ICD-10-CM | POA: Diagnosis not present

## 2024-02-23 DIAGNOSIS — H5203 Hypermetropia, bilateral: Secondary | ICD-10-CM | POA: Diagnosis not present

## 2024-02-23 DIAGNOSIS — H524 Presbyopia: Secondary | ICD-10-CM | POA: Diagnosis not present

## 2024-02-23 DIAGNOSIS — H2513 Age-related nuclear cataract, bilateral: Secondary | ICD-10-CM | POA: Diagnosis not present

## 2024-02-23 DIAGNOSIS — H43813 Vitreous degeneration, bilateral: Secondary | ICD-10-CM | POA: Diagnosis not present

## 2024-02-23 DIAGNOSIS — H25012 Cortical age-related cataract, left eye: Secondary | ICD-10-CM | POA: Diagnosis not present

## 2024-03-05 ENCOUNTER — Ambulatory Visit: Payer: Medicare Other

## 2024-03-15 ENCOUNTER — Ambulatory Visit

## 2024-03-17 ENCOUNTER — Ambulatory Visit: Attending: Cardiovascular Disease

## 2024-03-17 DIAGNOSIS — I639 Cerebral infarction, unspecified: Secondary | ICD-10-CM

## 2024-03-19 LAB — CUP PACEART REMOTE DEVICE CHECK
Date Time Interrogation Session: 20251128231414
Implantable Pulse Generator Implant Date: 20231218

## 2024-03-21 ENCOUNTER — Ambulatory Visit: Payer: Self-pay | Admitting: Cardiovascular Disease

## 2024-03-21 NOTE — Progress Notes (Signed)
 Remote Loop Recorder Transmission

## 2024-04-09 ENCOUNTER — Ambulatory Visit: Payer: Medicare Other

## 2024-04-16 ENCOUNTER — Ambulatory Visit

## 2024-04-17 ENCOUNTER — Ambulatory Visit

## 2024-04-17 DIAGNOSIS — I639 Cerebral infarction, unspecified: Secondary | ICD-10-CM | POA: Diagnosis not present

## 2024-04-18 ENCOUNTER — Ambulatory Visit: Payer: Self-pay | Admitting: Cardiovascular Disease

## 2024-04-18 LAB — CUP PACEART REMOTE DEVICE CHECK
Date Time Interrogation Session: 20251229231838
Implantable Pulse Generator Implant Date: 20231218

## 2024-04-18 NOTE — Progress Notes (Signed)
 Remote Loop Recorder Transmission

## 2024-04-23 ENCOUNTER — Ambulatory Visit: Admitting: Family Medicine

## 2024-05-14 ENCOUNTER — Ambulatory Visit: Payer: Medicare Other

## 2024-05-17 ENCOUNTER — Ambulatory Visit

## 2024-05-18 ENCOUNTER — Ambulatory Visit: Attending: Cardiovascular Disease

## 2024-05-18 ENCOUNTER — Ambulatory Visit: Payer: Self-pay | Admitting: Cardiovascular Disease

## 2024-05-18 DIAGNOSIS — I639 Cerebral infarction, unspecified: Secondary | ICD-10-CM | POA: Diagnosis not present

## 2024-05-18 LAB — CUP PACEART REMOTE DEVICE CHECK
Date Time Interrogation Session: 20260129232009
Implantable Pulse Generator Implant Date: 20231218

## 2024-05-22 NOTE — Progress Notes (Signed)
 Remote Loop Recorder Transmission

## 2024-06-18 ENCOUNTER — Ambulatory Visit

## 2024-07-19 ENCOUNTER — Ambulatory Visit

## 2024-08-19 ENCOUNTER — Ambulatory Visit

## 2024-08-20 ENCOUNTER — Ambulatory Visit

## 2024-09-19 ENCOUNTER — Ambulatory Visit

## 2024-10-20 ENCOUNTER — Ambulatory Visit

## 2024-11-20 ENCOUNTER — Ambulatory Visit

## 2024-12-21 ENCOUNTER — Ambulatory Visit

## 2025-01-21 ENCOUNTER — Ambulatory Visit

## 2025-02-21 ENCOUNTER — Ambulatory Visit

## 2025-03-24 ENCOUNTER — Ambulatory Visit
